# Patient Record
Sex: Female | Born: 1935
Health system: Southern US, Community
[De-identification: ages and names within clinical notes are randomized; demographics above are authoritative.]

## PROBLEM LIST (undated history)

## (undated) DIAGNOSIS — M1990 Unspecified osteoarthritis, unspecified site: Secondary | ICD-10-CM

## (undated) DIAGNOSIS — R351 Nocturia: Secondary | ICD-10-CM

## (undated) DIAGNOSIS — I10 Essential (primary) hypertension: Secondary | ICD-10-CM

## (undated) DIAGNOSIS — R3129 Other microscopic hematuria: Secondary | ICD-10-CM

## (undated) DIAGNOSIS — K5900 Constipation, unspecified: Secondary | ICD-10-CM

## (undated) DIAGNOSIS — N952 Postmenopausal atrophic vaginitis: Secondary | ICD-10-CM

## (undated) HISTORY — PX: REPLACEMENT TOTAL KNEE: SUR1224

## (undated) HISTORY — DX: Constipation, unspecified: K59.00

## (undated) HISTORY — PX: JOINT REPLACEMENT: SHX530

## (undated) HISTORY — DX: Essential (primary) hypertension: I10

## (undated) HISTORY — DX: Other microscopic hematuria: R31.29

## (undated) HISTORY — DX: Postmenopausal atrophic vaginitis: N95.2

## (undated) HISTORY — DX: Nocturia: R35.1

## (undated) HISTORY — PX: TONSILLECTOMY: SUR1361

## (undated) HISTORY — PX: ABDOMINAL HYSTERECTOMY: SHX81

## (undated) HISTORY — DX: Unspecified osteoarthritis, unspecified site: M19.90

---

## 1997-10-30 ENCOUNTER — Other Ambulatory Visit: Admission: RE | Admit: 1997-10-30 | Discharge: 1997-10-30 | Payer: Self-pay | Admitting: *Deleted

## 1997-12-12 ENCOUNTER — Other Ambulatory Visit: Admission: RE | Admit: 1997-12-12 | Discharge: 1997-12-12 | Payer: Self-pay | Admitting: *Deleted

## 1998-03-30 ENCOUNTER — Encounter: Payer: Self-pay | Admitting: Emergency Medicine

## 1998-03-30 ENCOUNTER — Emergency Department (HOSPITAL_COMMUNITY): Admission: EM | Admit: 1998-03-30 | Discharge: 1998-03-30 | Payer: Self-pay | Admitting: Emergency Medicine

## 2002-05-22 ENCOUNTER — Encounter: Payer: Self-pay | Admitting: Orthopedic Surgery

## 2002-05-30 ENCOUNTER — Inpatient Hospital Stay (HOSPITAL_COMMUNITY): Admission: RE | Admit: 2002-05-30 | Discharge: 2002-06-05 | Payer: Self-pay | Admitting: Orthopedic Surgery

## 2002-06-05 ENCOUNTER — Inpatient Hospital Stay (HOSPITAL_COMMUNITY)
Admission: RE | Admit: 2002-06-05 | Discharge: 2002-06-14 | Payer: Self-pay | Admitting: Physical Medicine & Rehabilitation

## 2003-03-04 ENCOUNTER — Inpatient Hospital Stay (HOSPITAL_COMMUNITY): Admission: RE | Admit: 2003-03-04 | Discharge: 2003-03-07 | Payer: Self-pay | Admitting: Orthopedic Surgery

## 2003-03-07 ENCOUNTER — Inpatient Hospital Stay (HOSPITAL_COMMUNITY)
Admission: RE | Admit: 2003-03-07 | Discharge: 2003-03-15 | Payer: Self-pay | Admitting: Physical Medicine & Rehabilitation

## 2003-03-13 ENCOUNTER — Encounter: Payer: Self-pay | Admitting: Physical Medicine & Rehabilitation

## 2004-03-13 ENCOUNTER — Ambulatory Visit (HOSPITAL_BASED_OUTPATIENT_CLINIC_OR_DEPARTMENT_OTHER): Admission: RE | Admit: 2004-03-13 | Discharge: 2004-03-13 | Payer: Self-pay | Admitting: General Surgery

## 2004-03-13 ENCOUNTER — Ambulatory Visit (HOSPITAL_COMMUNITY): Admission: RE | Admit: 2004-03-13 | Discharge: 2004-03-13 | Payer: Self-pay | Admitting: General Surgery

## 2009-06-17 DIAGNOSIS — F419 Anxiety disorder, unspecified: Secondary | ICD-10-CM | POA: Insufficient documentation

## 2009-06-17 DIAGNOSIS — M1990 Unspecified osteoarthritis, unspecified site: Secondary | ICD-10-CM | POA: Insufficient documentation

## 2009-06-17 DIAGNOSIS — Z79891 Long term (current) use of opiate analgesic: Secondary | ICD-10-CM | POA: Insufficient documentation

## 2009-06-17 DIAGNOSIS — K5900 Constipation, unspecified: Secondary | ICD-10-CM | POA: Insufficient documentation

## 2010-10-28 DIAGNOSIS — I10 Essential (primary) hypertension: Secondary | ICD-10-CM | POA: Insufficient documentation

## 2011-01-13 ENCOUNTER — Ambulatory Visit (HOSPITAL_COMMUNITY): Payer: Medicare Other | Attending: Surgery

## 2011-01-13 DIAGNOSIS — M810 Age-related osteoporosis without current pathological fracture: Secondary | ICD-10-CM | POA: Insufficient documentation

## 2011-03-03 DIAGNOSIS — E669 Obesity, unspecified: Secondary | ICD-10-CM | POA: Insufficient documentation

## 2011-12-10 ENCOUNTER — Encounter (INDEPENDENT_AMBULATORY_CARE_PROVIDER_SITE_OTHER): Payer: Self-pay | Admitting: General Surgery

## 2011-12-23 ENCOUNTER — Encounter (INDEPENDENT_AMBULATORY_CARE_PROVIDER_SITE_OTHER): Payer: Self-pay | Admitting: General Surgery

## 2011-12-23 DIAGNOSIS — R3129 Other microscopic hematuria: Secondary | ICD-10-CM | POA: Insufficient documentation

## 2011-12-27 ENCOUNTER — Ambulatory Visit (INDEPENDENT_AMBULATORY_CARE_PROVIDER_SITE_OTHER): Payer: BC Managed Care – PPO | Admitting: General Surgery

## 2011-12-27 ENCOUNTER — Encounter (INDEPENDENT_AMBULATORY_CARE_PROVIDER_SITE_OTHER): Payer: Self-pay | Admitting: General Surgery

## 2011-12-27 ENCOUNTER — Ambulatory Visit (INDEPENDENT_AMBULATORY_CARE_PROVIDER_SITE_OTHER): Payer: Medicare Other | Admitting: General Surgery

## 2011-12-27 VITALS — BP 155/90 | HR 64 | Temp 98.1°F | Resp 20 | Ht 63.0 in | Wt 200.8 lb

## 2011-12-27 DIAGNOSIS — K8020 Calculus of gallbladder without cholecystitis without obstruction: Secondary | ICD-10-CM

## 2011-12-27 NOTE — Patient Instructions (Signed)
During your workup for microscopic hematuria, a CT scan was done and demonstrated calcified gallstones in your gallbladder. This is not related to your kidney and bladder problems.  As best as I can tell, you have not had any symptoms of gallbladder attacks whatsoever.  At this point in time, I do not think that it is mandatory that you have a gallbladder operation.  On the other hand, if you startedeveloping abdominal pain, nausea or vomiting or developed abnormal liver function tests, then we should reconsider an operation to remove your gallbladder.  Please read the book that Dr. Derrell Lolling gave you.  Return to see Dr. Derrell Lolling if yo developed any gallbladder problems.

## 2011-12-27 NOTE — Progress Notes (Signed)
Patient ID: Susan Pitts, female   DOB: 1924/04/01, 78 y.o.   MRN: [id number]  Chief Complaint  Patient presents with  . Other    eval GB with stones    HPI Susan Pitts is a 78 y.o. female.  She is referred by Susan Pitts  urology for evaluation of calcified gallstones seen on CT scan. Her primary care physician is Susan Pitts.  This patient is 78 years  old, has moderately severe arthritis. has had bilateral total knee replacements and has moderate to severe arthritis in her hands. She also has a history of well-controlled hypertension, obesity and partial hysterectomy.  She has not had any gastrointestinal complaints. Her appetite is normal. She does not have any type of fatty food intolerance. He doesn't have any abdominal pain, nausea, vomiting or alteration in her bowel habits. She has no history of hepatitis or abnormal liver function tests. She has no history of pancreatitis.  She was recently evaluated for asymptomatic microscopic hematuria. She's had CT scan and cystoscopy and nothing has been found, apparently. The CT scan showed calcified gallstones within the gallbladder, the largest measuring 1.5 cm. She was referred to me for a surgical opinion about her gallbladder.  She is asymptomatic today and her daughter is with her. HPI  Past Medical History  Diagnosis Date  . Microscopic hematuria   . Postmenopausal atrophic vaginitis   . Hypertension   . Arthritis   . Constipation   . Nocturia     Past Surgical History  Procedure Date  . Abdominal hysterectomy   . Joint replacement     knee replacement  . Tonsillectomy     Family History  Problem Relation Age of Onset  . Heart attack Father     Social History History  Substance Use Topics  . Smoking status: Never Smoker   . Smokeless tobacco: Never Used  . Alcohol Use: No    Allergies  Allergen Reactions  . Cardura (Doxazosin Mesylate)   . Codeine   . Inderal (Propranolol)   . Penicillins      Current Outpatient Prescriptions  Medication Sig Dispense Refill  . aspirin 81 MG tablet Take 81 mg by mouth daily.      . benazepril (LOTENSIN) 40 MG tablet Take 40 mg by mouth daily.      . Calcium Carbonate-Vitamin D (CALTRATE 600+D PO) Take by mouth.      . Cholecalciferol (VITAMIN D-3 PO) Take by mouth.      . clindamycin (CLEOCIN) 300 MG capsule Take 300 mg by mouth 3 (three) times daily.      . diazepam (VALIUM) 5 MG tablet Take 5 mg by mouth every 6 (six) hours as needed.      . furosemide (LASIX) 40 MG tablet Take 40 mg by mouth daily.      Marland Kitchen HYDROcodone-acetaminophen (VICODIN) 5-500 MG per tablet Take 1 tablet by mouth every 6 (six) hours as needed.      . Loratadine (ALAVERT PO) Take by mouth.      Marland Kitchen LORATADINE PO Take by mouth.      . METOPROLOL TARTRATE PO Take 25 mg by mouth.        Review of Systems Review of Systems  Constitutional: Negative for fever, chills and unexpected weight change.  HENT: Negative for hearing loss, congestion, sore throat, trouble swallowing and voice change.   Eyes: Negative for visual disturbance.  Respiratory: Negative for cough and wheezing.   Cardiovascular: Negative for  chest pain, palpitations and leg swelling.  Gastrointestinal: Negative for nausea, vomiting, abdominal pain, diarrhea, constipation, blood in stool, abdominal distention and anal bleeding.  Genitourinary: Negative for hematuria, vaginal bleeding and difficulty urinating.  Musculoskeletal: Positive for myalgias, joint swelling, arthralgias and gait problem.  Skin: Negative for rash and wound.  Neurological: Negative for seizures, syncope and headaches.  Hematological: Negative for adenopathy. Does not bruise/bleed easily.  Psychiatric/Behavioral: Negative for confusion. The patient is nervous/anxious.     Blood pressure 155/90, pulse 64, temperature 98.1 F (36.7 C), temperature source Temporal, resp. rate 20, height 5\' 3"  (1.6 m), weight 200 lb 12.8 oz (91.082  kg).  Physical Exam Physical Exam  Constitutional: She is oriented to person, place, and time. She appears well-developed and well-nourished. No distress.  HENT:  Head: Normocephalic and atraumatic.  Nose: Nose normal.  Mouth/Throat: No oropharyngeal exudate.  Eyes: Conjunctivae and EOM are normal. Pupils are equal, round, and reactive to light. Left eye exhibits no discharge. No scleral icterus.  Neck: Neck supple. No JVD present. No tracheal deviation present. No thyromegaly present.  Cardiovascular: Normal rate, regular rhythm, normal heart sounds and intact distal pulses.   No murmur heard. Pulmonary/Chest: Effort normal and breath sounds normal. No respiratory distress. She has no wheezes. She has no rales. She exhibits no tenderness.  Abdominal: Soft. Bowel sounds are normal. She exhibits no distension and no mass. There is no tenderness. There is no rebound and no guarding.       Obese.  Musculoskeletal: She exhibits no edema and no tenderness.       Changes of arthritis in the hands.arthritic gait.  Lymphadenopathy:    She has no cervical adenopathy.  Neurological: She is alert and oriented to person, place, and time. She exhibits normal muscle tone. Coordination normal.  Skin: Skin is warm. No rash noted. She is not diaphoretic. No erythema. No pallor.  Psychiatric: She has a normal mood and affect. Her behavior is normal. Judgment and thought content normal.    Data Reviewed I reviewed her recent CT scan and the notes from [facility]  Assessment    Asymptomatic gallstones. Considering her advanced age and absence of diabetes, I do not think that there is a compelling reason to perform a prophylactic cholecystectomy. If she develops any biliary tract symptoms, I think that we should reconsider and proceed with cholecystectomy at that time.  Microscopic hematuria, no specific etiology identified following extensive evaluation  Arthritis  Hypertension  Obesity.     Plan    We had a lengthy discussion regarding these issues with the patient and her daughter. She was given patient information booklets explaining  symptoms, pathophysiology, diagnosis, and surgical procedures. They understand that if she develops any symptoms of gallbladder disease, that we should consider cholecystectomy at that time. They're also aware that she may never develop any symptoms, and might  avoid surgery at her advanced age.  They seemed to understand these issues well.  They will call me or Dr. Eloise Harman if biliary tract symptoms appear.       Angelia Mould. Derrell Lolling, M.D., [facility] General and Minimally invasive Surgery Breast and Colorectal Surgery Office:   [phone] Pager:   [phone]  12/27/2011, 2:50 PM

## 2012-01-10 ENCOUNTER — Encounter (INDEPENDENT_AMBULATORY_CARE_PROVIDER_SITE_OTHER): Payer: Self-pay

## 2013-02-06 ENCOUNTER — Other Ambulatory Visit: Payer: Self-pay | Admitting: Ophthalmology

## 2013-10-22 ENCOUNTER — Emergency Department (HOSPITAL_COMMUNITY): Payer: Medicare Other

## 2013-10-22 ENCOUNTER — Encounter (HOSPITAL_COMMUNITY): Payer: Self-pay | Admitting: Emergency Medicine

## 2013-10-22 ENCOUNTER — Emergency Department (HOSPITAL_COMMUNITY)
Admission: EM | Admit: 2013-10-22 | Discharge: 2013-10-22 | Disposition: A | Discharge status: Home / Self Care | Payer: Medicare Other | Attending: Emergency Medicine | Admitting: Emergency Medicine

## 2013-10-22 DIAGNOSIS — M129 Arthropathy, unspecified: Secondary | ICD-10-CM | POA: Insufficient documentation

## 2013-10-22 DIAGNOSIS — S0510XA Contusion of eyeball and orbital tissues, unspecified eye, initial encounter: Secondary | ICD-10-CM | POA: Insufficient documentation

## 2013-10-22 DIAGNOSIS — S0990XA Unspecified injury of head, initial encounter: Secondary | ICD-10-CM | POA: Insufficient documentation

## 2013-10-22 DIAGNOSIS — Z7982 Long term (current) use of aspirin: Secondary | ICD-10-CM | POA: Insufficient documentation

## 2013-10-22 DIAGNOSIS — W19XXXA Unspecified fall, initial encounter: Secondary | ICD-10-CM

## 2013-10-22 DIAGNOSIS — Z79899 Other long term (current) drug therapy: Secondary | ICD-10-CM | POA: Insufficient documentation

## 2013-10-22 DIAGNOSIS — Z8742 Personal history of other diseases of the female genital tract: Secondary | ICD-10-CM | POA: Insufficient documentation

## 2013-10-22 DIAGNOSIS — H05239 Hemorrhage of unspecified orbit: Secondary | ICD-10-CM

## 2013-10-22 DIAGNOSIS — K5900 Constipation, unspecified: Secondary | ICD-10-CM | POA: Insufficient documentation

## 2013-10-22 DIAGNOSIS — W010XXA Fall on same level from slipping, tripping and stumbling without subsequent striking against object, initial encounter: Secondary | ICD-10-CM | POA: Insufficient documentation

## 2013-10-22 DIAGNOSIS — I10 Essential (primary) hypertension: Secondary | ICD-10-CM | POA: Insufficient documentation

## 2013-10-22 DIAGNOSIS — Z792 Long term (current) use of antibiotics: Secondary | ICD-10-CM | POA: Insufficient documentation

## 2013-10-22 DIAGNOSIS — Y9229 Other specified public building as the place of occurrence of the external cause: Secondary | ICD-10-CM | POA: Insufficient documentation

## 2013-10-22 DIAGNOSIS — W1809XA Striking against other object with subsequent fall, initial encounter: Secondary | ICD-10-CM | POA: Insufficient documentation

## 2013-10-22 DIAGNOSIS — Y9389 Activity, other specified: Secondary | ICD-10-CM | POA: Insufficient documentation

## 2013-10-22 DIAGNOSIS — Z87448 Personal history of other diseases of urinary system: Secondary | ICD-10-CM | POA: Insufficient documentation

## 2013-10-22 DIAGNOSIS — Z880 Allergy status to penicillin: Secondary | ICD-10-CM | POA: Insufficient documentation

## 2013-10-22 DIAGNOSIS — S00511A Abrasion of lip, initial encounter: Secondary | ICD-10-CM

## 2013-10-22 DIAGNOSIS — Z23 Encounter for immunization: Secondary | ICD-10-CM | POA: Insufficient documentation

## 2013-10-22 DIAGNOSIS — IMO0002 Reserved for concepts with insufficient information to code with codable children: Secondary | ICD-10-CM | POA: Insufficient documentation

## 2013-10-22 MED ORDER — TETANUS-DIPHTH-ACELL PERTUSSIS 5-2.5-18.5 LF-MCG/0.5 IM SUSP
0.5000 mL | Freq: Once | INTRAMUSCULAR | Status: AC
  Administered 2013-10-22: 0.5 mL via INTRAMUSCULAR
  Filled 2013-10-22: qty 0.5

## 2013-10-22 MED ORDER — OXYCODONE-ACETAMINOPHEN 5-325 MG PO TABS: 1.0000 | ORAL_TABLET | Freq: Four times a day (QID) | ORAL | Status: DC | PRN

## 2013-10-22 MED ORDER — OXYCODONE-ACETAMINOPHEN 5-325 MG PO TABS
1.0000 | ORAL_TABLET | Freq: Once | ORAL | Status: AC
  Administered 2013-10-22: 1 via ORAL
  Filled 2013-10-22: qty 1

## 2013-10-22 NOTE — Discharge Instructions (Signed)
Return to the ED with any concerns including vomiting, seizure activity, changes in vision, decreased level of alertness/lethargy, or any other alarming symptoms  You should not take hydrocodone at the same time you are taking oxycodone as they both contain added tylenol.  You should not take more than 4 grams of tylenol in 24 hours.

## 2013-10-22 NOTE — ED Provider Notes (Signed)
CSN: [id number]     Arrival date & time 10/22/13  1316 History   First MD Initiated Contact with Patient 10/22/13 1324     Chief Complaint  Patient presents with  . Fall     (Consider location/radiation/quality/duration/timing/severity/associated sxs/prior Treatment) HPI Pt presenting with c/o fall.  She states she tripped while cleaning out the garage.  She hit the right side of her face.  No LOC, no vomiting, no seizure activity.  Denies neck or back pain.   No changes in vision.  No eye pain. No epistaxis.  No difficulty breathing or abdominal pain.  Pt not sure of when her last tetanus shot was.  There are no other associated systemic symptoms, there are no other alleviating or modifying factors.   Past Medical History  Diagnosis Date  . Microscopic hematuria   . Postmenopausal atrophic vaginitis   . Hypertension   . Arthritis   . Constipation   . Nocturia    Past Surgical History  Procedure Laterality Date  . Abdominal hysterectomy    . Joint replacement      knee replacement  . Tonsillectomy     Family History  Problem Relation Age of Onset  . Heart attack Father    History  Substance Use Topics  . Smoking status: Never Smoker   . Smokeless tobacco: Never Used  . Alcohol Use: No   OB History   Grav Para Term Preterm Abortions TAB SAB Ect Mult Living                 Review of Systems ROS reviewed and all otherwise negative except for mentioned in HPI    Allergies  Cardura; Codeine; Inderal; and Penicillins  Home Medications   Prior to Admission medications   Medication Sig Start Date End Date Taking? Authorizing Provider  aspirin 81 MG tablet Take 81 mg by mouth daily.   Yes Historical Provider, MD  benazepril (LOTENSIN) 40 MG tablet Take 40 mg by mouth daily.   Yes Historical Provider, MD  Calcium Carbonate-Vitamin D (CALTRATE 600+D PO) Take 1 tablet by mouth daily.    Yes Historical Provider, MD  Cholecalciferol (VITAMIN D-3 PO) Take 2,000 Units by  mouth daily.    Yes Historical Provider, MD  diazepam (VALIUM) 5 MG tablet Take 5 mg by mouth 2 (two) times daily.    Yes Historical Provider, MD  Docusate Calcium (STOOL SOFTENER PO) Take 100 mg by mouth daily.    Yes Historical Provider, MD  furosemide (LASIX) 40 MG tablet Take 40 mg by mouth daily.   Yes Historical Provider, MD  HYDROcodone-acetaminophen (VICODIN) 5-500 MG per tablet Take 1 tablet by mouth every 6 (six) hours as needed for pain.    Yes Historical Provider, MD  loratadine (CLARITIN) 10 MG tablet Take 10 mg by mouth daily as needed for allergies.   Yes Historical Provider, MD  metoprolol tartrate (LOPRESSOR) 25 MG tablet Take 25 mg by mouth daily.   Yes Historical Provider, MD  OVER THE COUNTER MEDICATION Take 1 drop by mouth daily. Takes one dropper full every day.   Yes Historical Provider, MD  clindamycin (CLEOCIN) 300 MG capsule Take 600 mg by mouth See admin instructions. 600 mg one hour before dental work and 600 mg 6 hours after.    Historical Provider, MD   BP 175/96  Pulse 76  Temp(Src) 98 F (36.7 C) (Oral)  Resp 16  SpO2 96% Vitals reviewed Physical Exam Physical Examination: General appearance - alert,  well appearing, and in no distress Mental status - alert, oriented to person, place, and time Eyes - pupils equal and reactive, extraocular eye movements intact without pain Face- swelling and echhymosis around left eye with some mild ttp, no maloclusion, no midface tenderness or instability Mouth - mucous membranes moist, pharynx normal without lesions, abrasion of upper and lower lip- no laceration, no involvement of vermillion border Neck - supple, no significant adenopathy, no midline tenderness to palpation Chest - clear to auscultation, no wheezes, rales or rhonchi, symmetric air entry Heart - normal rate, regular rhythm, normal S1, S2, no murmurs, rubs, clicks or gallops Abdomen - soft, nontender, nondistended, no masses or organomegaly Back exam - full  range of motion, no midline tenderness, palpable spasm or pain on motion, no CVA tenderness Extremities - peripheral pulses normal, no pedal edema, no clubbing or cyanosis Skin - normal coloration and turgor, no rashes  ED Course  Procedures (including critical care time) Labs Review Labs Reviewed - No data to display  Imaging Review No results found.   EKG Interpretation None      MDM   Final diagnoses:  Fall  Periorbital hematoma  Abrasion of lip    Pt presenting with c/o fall with bruising over right face.  Head and MF CT are reassuring.  Tetanus updated.  Discharged with strict return precautions.  Pt agreeable with plan.   Ethelda Chick, MD 10/25/13 [phone]

## 2013-10-22 NOTE — ED Notes (Signed)
Pt here from home after tripping and falling in the garage hitting the right side of her face , no loc

## 2013-10-24 DIAGNOSIS — W19XXXA Unspecified fall, initial encounter: Secondary | ICD-10-CM | POA: Insufficient documentation

## 2013-12-12 DIAGNOSIS — F329 Major depressive disorder, single episode, unspecified: Secondary | ICD-10-CM | POA: Insufficient documentation

## 2013-12-12 DIAGNOSIS — R269 Unspecified abnormalities of gait and mobility: Secondary | ICD-10-CM | POA: Insufficient documentation

## 2015-01-09 ENCOUNTER — Ambulatory Visit (INDEPENDENT_AMBULATORY_CARE_PROVIDER_SITE_OTHER): Payer: Medicare Other | Admitting: Podiatry

## 2015-01-09 ENCOUNTER — Encounter: Payer: Self-pay | Admitting: Podiatry

## 2015-01-09 VITALS — BP 149/68 | HR 67 | Resp 12

## 2015-01-09 DIAGNOSIS — M2041 Other hammer toe(s) (acquired), right foot: Secondary | ICD-10-CM | POA: Diagnosis not present

## 2015-01-09 DIAGNOSIS — Q828 Other specified congenital malformations of skin: Secondary | ICD-10-CM | POA: Diagnosis not present

## 2015-01-09 NOTE — Patient Instructions (Signed)
Distal clavus 3rd toe right

## 2015-01-09 NOTE — Progress Notes (Signed)
   Subjective:    Patient ID: Susan Pitts, female    DOB: 1924/04/07, 81 y.o.   MRN: [id number]  HPI: She presents today complaining of a painful wart to the plantar distal aspect of the third digit of the right foot. She states that her primary care doctor Jarold Motto has tried to trim on it on several occasions and has since referred her to our practice due to his inability to resolve her pain. She denies any trauma to the toes and states that it hurts anytime she is ambulatory.    Review of Systems  Respiratory: Positive for shortness of breath.   Genitourinary: Positive for frequency.  Musculoskeletal: Positive for back pain and gait problem.  Neurological: Positive for weakness.  Hematological: Bruises/bleeds easily.       Objective:   Physical Exam: 81 year old white female vital signs are stable she is alert and oriented 3. Pulses are palpable bilateral. Neurologic sensorium is intact for Semmes-Weinstein monofilament. Deep tendon reflexes are intact bilateral and muscle strength +5 over 5 dorsiflexion plantar flexors and inverters everters all intrinsic musculature is intact. Orthopedic evaluation demonstrate solid joints distal to the ankle have full range of motion without crepitation. Cutaneous evaluation demonstrates supple well-hydrated cutis distal lesion third digit of the right foot. Orthopedic also doesn't straight hammertoe deformities bilateral these are rigid in nature particularly to the right foot which is resulting in a distal clavus of the third toe. This is not verrucoid in nature it is simply a distal clavus with porokeratotic tendencies. No signs of infection.        Assessment & Plan:  Assessment: Hammertoe deformities with osteoarthritic changes right foot. Distal clavus third digit of the right foot.  Plan: Discussed etiology pathology conservative versus surgical therapies. Debrided the reactive hyperkeratotic lesion 1 distal aspect third digit right and  placed her in a sulcus pad. Follow up with her as needed.

## 2016-05-27 ENCOUNTER — Ambulatory Visit (HOSPITAL_COMMUNITY)
Admission: EM | Admit: 2016-05-27 | Discharge: 2016-05-27 | Disposition: A | Discharge status: Home / Self Care | Payer: Medicare Other | Attending: Family Medicine | Admitting: Family Medicine

## 2016-05-27 ENCOUNTER — Ambulatory Visit (INDEPENDENT_AMBULATORY_CARE_PROVIDER_SITE_OTHER): Payer: Medicare Other

## 2016-05-27 ENCOUNTER — Encounter (HOSPITAL_COMMUNITY): Payer: Self-pay | Admitting: Family Medicine

## 2016-05-27 DIAGNOSIS — M25531 Pain in right wrist: Secondary | ICD-10-CM | POA: Diagnosis not present

## 2016-05-27 DIAGNOSIS — M19231 Secondary osteoarthritis, right wrist: Secondary | ICD-10-CM | POA: Diagnosis not present

## 2016-05-27 DIAGNOSIS — S63501A Unspecified sprain of right wrist, initial encounter: Secondary | ICD-10-CM | POA: Diagnosis not present

## 2016-05-27 NOTE — ED Triage Notes (Signed)
Pt here for right wrist pain and swelling. Denise injury. sts x 3 days.

## 2016-05-27 NOTE — ED Provider Notes (Signed)
CSN: [id number]     Arrival date & time 05/27/16  1406 History   None    Chief Complaint  Patient presents with  . Wrist Pain   (Consider location/radiation/quality/duration/timing/severity/associated sxs/prior Treatment) Patient c/o right wrist pain and discomfort.   The history is provided by the patient and a relative.  Wrist Pain  This is a new problem. The current episode started more than 2 days ago. The problem occurs constantly. The problem has not changed since onset.Nothing aggravates the symptoms.    Past Medical History:  Diagnosis Date  . Arthritis   . Constipation   . Hypertension   . Microscopic hematuria   . Nocturia   . Postmenopausal atrophic vaginitis    Past Surgical History:  Procedure Laterality Date  . ABDOMINAL HYSTERECTOMY    . JOINT REPLACEMENT     knee replacement  . TONSILLECTOMY     Family History  Problem Relation Age of Onset  . Heart attack Father    Social History  Substance Use Topics  . Smoking status: Never Smoker  . Smokeless tobacco: Never Used  . Alcohol use No   OB History    No data available     Review of Systems  Constitutional: Negative.   HENT: Negative.   Eyes: Negative.   Respiratory: Negative.   Cardiovascular: Negative.   Endocrine: Negative.   Genitourinary: Negative.   Musculoskeletal: Positive for joint swelling.  Skin: Negative.   Allergic/Immunologic: Negative.   Neurological: Negative.   Hematological: Negative.   Psychiatric/Behavioral: Negative.     Allergies  Cardura [doxazosin mesylate]; Codeine; Inderal [propranolol]; and Penicillins  Home Medications   Prior to Admission medications   Medication Sig Start Date End Date Taking? Authorizing Provider  aspirin 81 MG tablet Take 81 mg by mouth daily.    Historical Provider, MD  benazepril (LOTENSIN) 40 MG tablet Take 40 mg by mouth daily.    Historical Provider, MD  Calcium Carbonate-Vitamin D (CALTRATE 600+D PO) Take 1 tablet by mouth  daily.     Historical Provider, MD  Cholecalciferol (VITAMIN D-3 PO) Take 2,000 Units by mouth daily.     Historical Provider, MD  clindamycin (CLEOCIN) 300 MG capsule Take 600 mg by mouth See admin instructions. 600 mg one hour before dental work and 600 mg 6 hours after.    Historical Provider, MD  diazepam (VALIUM) 5 MG tablet Take 5 mg by mouth 2 (two) times daily.     Historical Provider, MD  Docusate Calcium (STOOL SOFTENER PO) Take 100 mg by mouth daily.     Historical Provider, MD  furosemide (LASIX) 40 MG tablet Take 40 mg by mouth daily.    Historical Provider, MD  HYDROcodone-acetaminophen (VICODIN) 5-500 MG per tablet Take 1 tablet by mouth every 6 (six) hours as needed for pain.     Historical Provider, MD  loratadine (CLARITIN) 10 MG tablet Take 10 mg by mouth daily as needed for allergies.    Historical Provider, MD  metoprolol tartrate (LOPRESSOR) 25 MG tablet Take 25 mg by mouth daily.    Historical Provider, MD  OVER THE COUNTER MEDICATION Take 1 drop by mouth daily. Takes one dropper full every day.    Historical Provider, MD  oxyCODONE-acetaminophen (PERCOCET/ROXICET) 5-325 MG per tablet Take 1-2 tablets by mouth every 6 (six) hours as needed for severe pain. 10/22/13   Jerelyn ScottMartha Linker, MD   Meds Ordered and Administered this Visit  Medications - No data to display  BP Marland Kitchen(!)  181/45   Pulse 96   Temp 97.8 F (36.6 C)   Resp 16   SpO2 96%  No data found.   Physical Exam  Constitutional: She appears well-developed and well-nourished.  HENT:  Head: Normocephalic and atraumatic.  Eyes: EOM are normal. Pupils are equal, round, and reactive to light.  Neck: Normal range of motion. Neck supple.  Cardiovascular: Normal rate, regular rhythm and normal heart sounds.   Pulmonary/Chest: Effort normal and breath sounds normal.  Musculoskeletal: She exhibits tenderness.  Right wrist tenderness and mild swelling  Nursing note and vitals reviewed.   Urgent Care Course   Clinical  Course     Procedures (including critical care time)  Labs Review Labs Reviewed - No data to display  Imaging Review Dg Wrist Complete Right  Result Date: 05/27/2016 CLINICAL DATA:  Right wrist pain, no acute injury EXAM: RIGHT WRIST - COMPLETE 3+ VIEW COMPARISON:  None. FINDINGS: There is degenerative change involving the right radiocarpal joint space with some loss of joint space and sclerosis with some fusion of the distal right radius and ulna. There is degenerative change along the radial aspect of the wrist at the articulation of both the first metatarsal and trapezium as well as the navicular and trapezium. No acute fracture is seen. Alignment is normal. IMPRESSION: Degenerative change in the right wrist and at the right [facility]. No acute abnormality. Electronically Signed   By: Dwyane DeePaul  Barry M.D.   On: 05/27/2016 15:24     Visual Acuity Review  Right Eye Distance:   Left Eye Distance:   Bilateral Distance:    Right Eye Near:   Left Eye Near:    Bilateral Near:         MDM   1. Sprain of right wrist, initial encounter   2. Other secondary osteoarthritis of right wrist   3. Right wrist pain    Take hydrocodone from home Right wrist splint Follow up with PCP.      Deatra CanterWilliam J Brodin Gelpi, FNP 05/27/16 1556

## 2016-05-27 NOTE — ED Notes (Signed)
Question on discharge

## 2016-09-23 ENCOUNTER — Other Ambulatory Visit: Payer: Self-pay | Admitting: Internal Medicine

## 2016-09-23 NOTE — Progress Notes (Signed)
Changed home med list per patient request

## 2017-01-13 DIAGNOSIS — M19049 Primary osteoarthritis, unspecified hand: Secondary | ICD-10-CM | POA: Insufficient documentation

## 2017-12-20 DIAGNOSIS — I739 Peripheral vascular disease, unspecified: Secondary | ICD-10-CM | POA: Insufficient documentation

## 2018-03-09 ENCOUNTER — Ambulatory Visit: Payer: Medicare Other | Admitting: Podiatry

## 2018-03-09 ENCOUNTER — Encounter: Payer: Self-pay | Admitting: Podiatry

## 2018-03-09 VITALS — BP 172/66 | HR 68 | Resp 16

## 2018-03-09 DIAGNOSIS — Q828 Other specified congenital malformations of skin: Secondary | ICD-10-CM

## 2018-03-09 DIAGNOSIS — B351 Tinea unguium: Secondary | ICD-10-CM

## 2018-03-09 DIAGNOSIS — M79676 Pain in unspecified toe(s): Secondary | ICD-10-CM

## 2018-03-09 NOTE — Progress Notes (Signed)
She presents today chief complaint of painful elongated toenails and according to the third digit distal aspect with hammertoe deformity.  Objective: Vital signs are stable she is alert and oriented x3 pulses are palpable.  Hammertoe deformities resulting in the distal clavus of the third digit of the right foot with no underlying lung or opening.  She also has thick yellow dystrophic-like mycotic toenails.  Assessment: Distal clavus third digit right foot.  Pain in limb center onychomycosis.  Plan: Debridement of toenails 1 through 5 bilateral.  Debridement of reactive hyperkeratosis.

## 2018-06-13 ENCOUNTER — Ambulatory Visit: Payer: Medicare Other | Admitting: Podiatry

## 2018-06-13 ENCOUNTER — Encounter: Payer: Self-pay | Admitting: Podiatry

## 2018-06-13 DIAGNOSIS — B351 Tinea unguium: Secondary | ICD-10-CM | POA: Diagnosis not present

## 2018-06-13 DIAGNOSIS — M79676 Pain in unspecified toe(s): Secondary | ICD-10-CM

## 2018-06-13 DIAGNOSIS — Q828 Other specified congenital malformations of skin: Secondary | ICD-10-CM | POA: Diagnosis not present

## 2018-06-13 NOTE — Progress Notes (Signed)
She presents today with a chief complaint of a painful callus to the distal aspect of the third toe on the right foot.  States that she has arthritis in the third toe hurts and did not really feel much better after we trimmed it last time.  She is also complaining of painful elongated toenails.  Objective: Vital signs are stable she is alert and oriented x3.  Pulses are palpable.  Toenails are long thick yellow dystrophic with mycotic reactive hyperkeratotic lesion without an open wound to the distal aspect of the third toe of the right foot.  Assessment: Pain in limb secondary to onychomycosis and porokeratosis third digit right foot.  Plan: Debridement of all reactive hyperkeratotic tissue and debridement of toenails 1 through 5 bilateral.

## 2018-07-11 IMAGING — DX DG WRIST COMPLETE 3+V*R*
4 series · 4 of 4 positions shown · non-contrast
Comparison: None.

CLINICAL DATA: Right wrist pain, no acute injury

EXAM:
RIGHT WRIST - COMPLETE 3+ VIEW

[wrist pa]
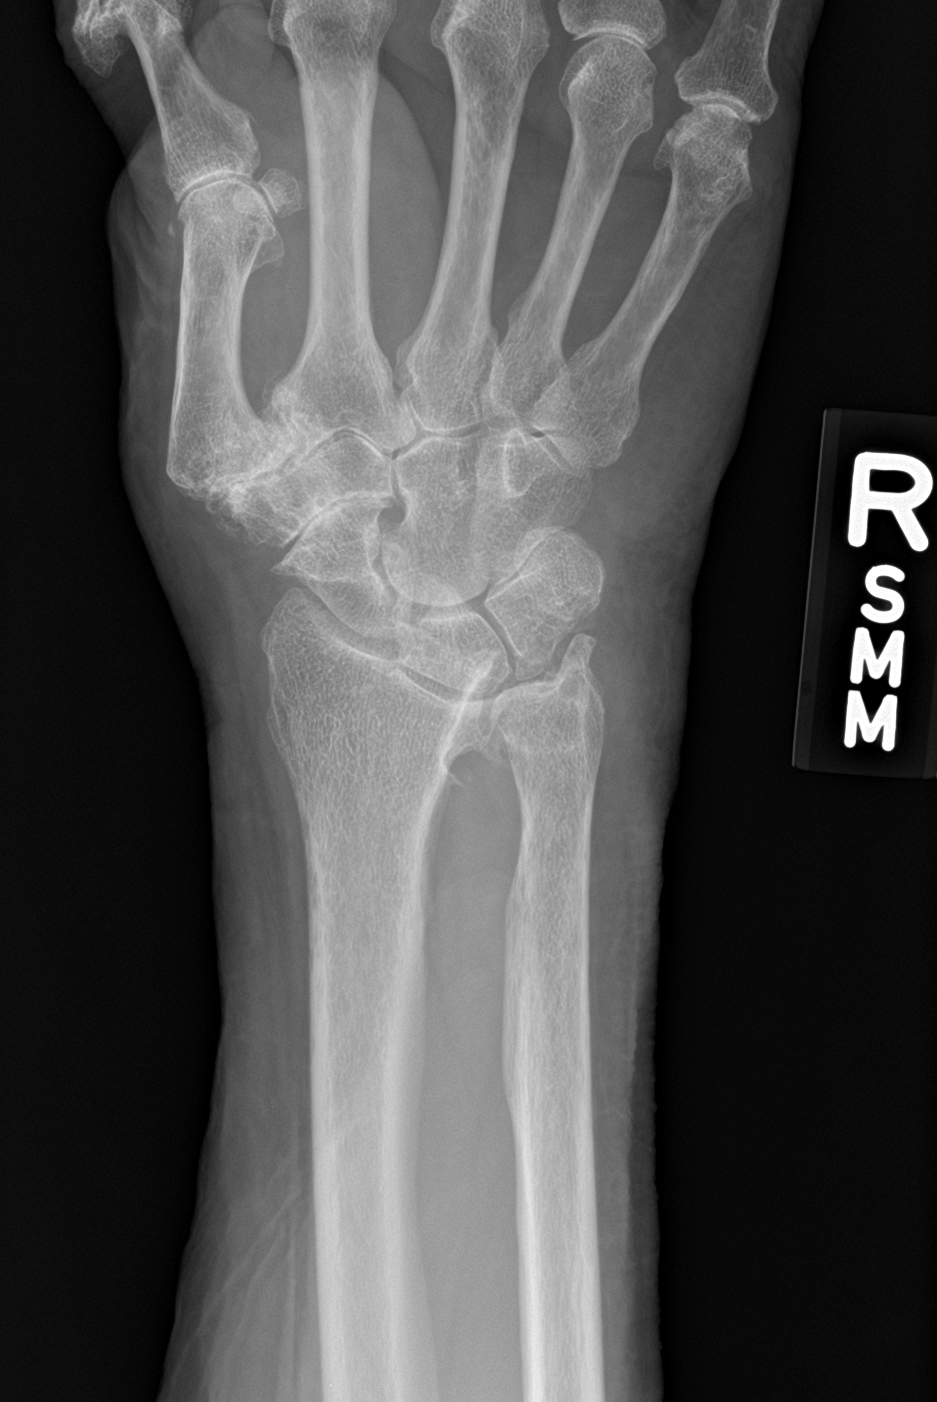

[wrist navicular]
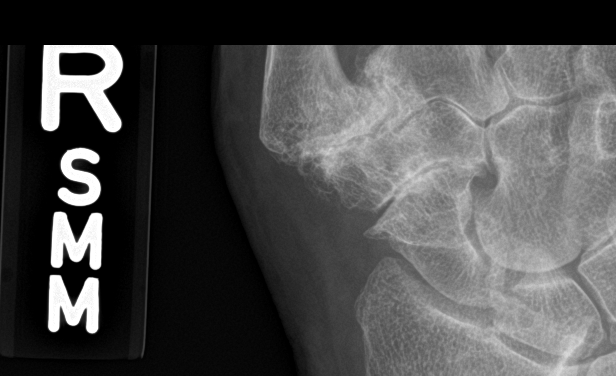

[wrist obl]
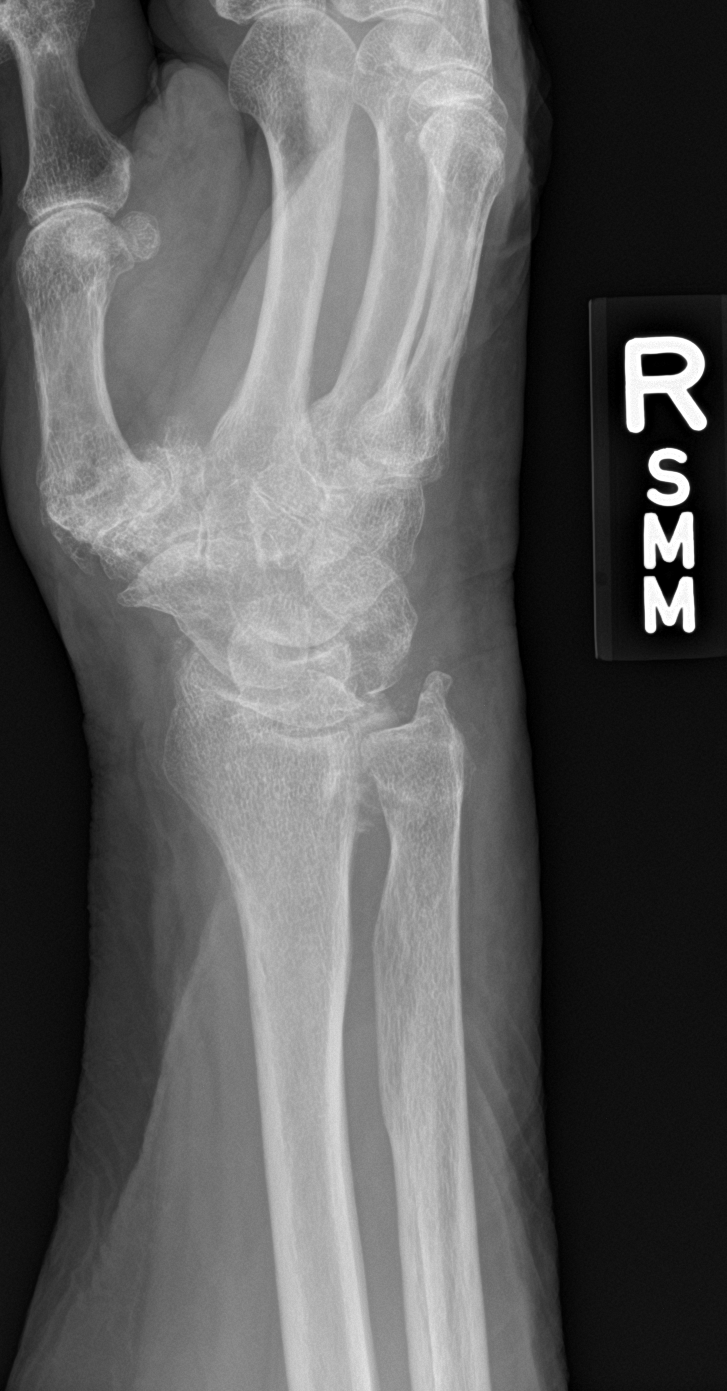

[wrist lat]
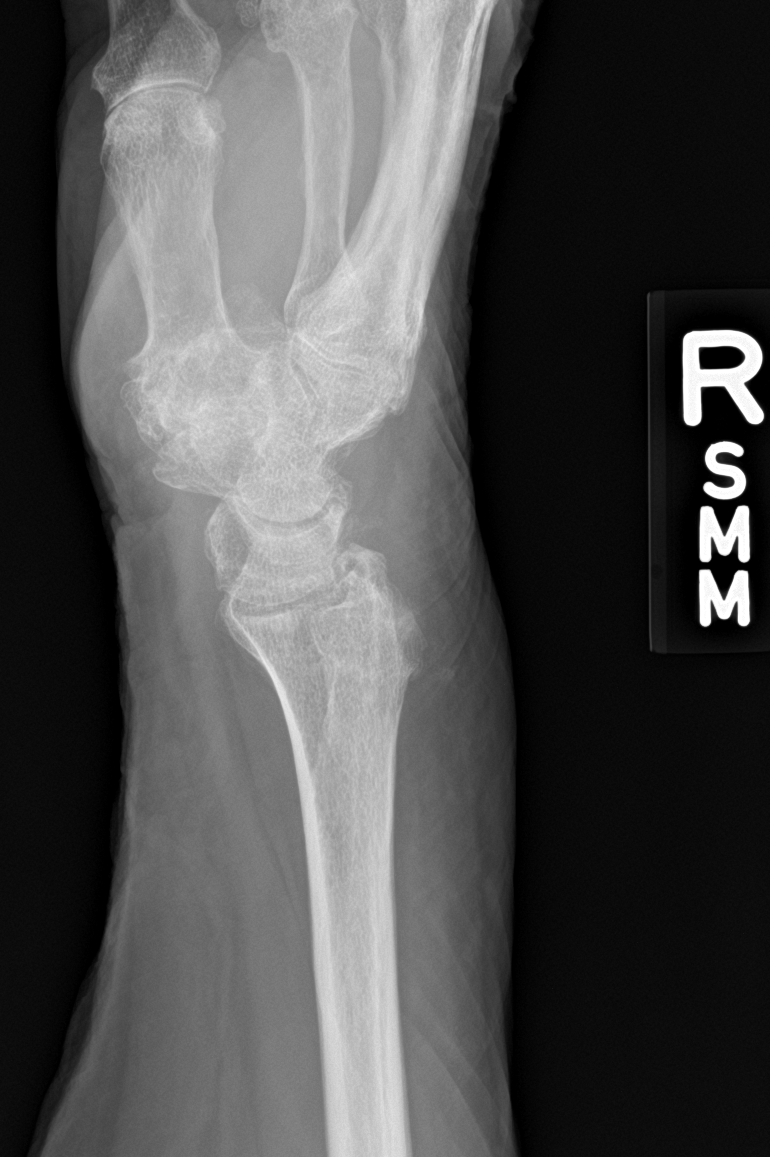

[4 of 4 positions shown; findings below may reference images not displayed]

FINDINGS: There is degenerative change involving the right radiocarpal joint
space with some loss of joint space and sclerosis with some fusion
of the distal right radius and ulna. There is degenerative change
along the radial aspect of the wrist at the articulation of both the
first metatarsal and trapezium as well as the navicular and
trapezium. No acute fracture is seen. Alignment is normal.
IMPRESSION: Degenerative change in the right wrist and at the right first CMC
joint. No acute abnormality.

## 2018-09-07 ENCOUNTER — Ambulatory Visit: Payer: Medicare Other | Admitting: Podiatry

## 2018-09-26 ENCOUNTER — Ambulatory Visit: Payer: Medicare Other | Admitting: Podiatry

## 2018-11-21 ENCOUNTER — Other Ambulatory Visit: Payer: Self-pay

## 2018-11-21 ENCOUNTER — Encounter: Payer: Self-pay | Admitting: Podiatry

## 2018-11-21 ENCOUNTER — Ambulatory Visit (INDEPENDENT_AMBULATORY_CARE_PROVIDER_SITE_OTHER): Payer: Medicare Other | Admitting: Podiatry

## 2018-11-21 DIAGNOSIS — M79675 Pain in left toe(s): Secondary | ICD-10-CM | POA: Diagnosis not present

## 2018-11-21 DIAGNOSIS — B351 Tinea unguium: Secondary | ICD-10-CM | POA: Diagnosis not present

## 2018-11-21 DIAGNOSIS — L84 Corns and callosities: Secondary | ICD-10-CM | POA: Diagnosis not present

## 2018-11-21 DIAGNOSIS — M79674 Pain in right toe(s): Secondary | ICD-10-CM

## 2018-11-21 NOTE — Progress Notes (Signed)
This patient presents the office with chief complaint of a painful callus on the tip of the third toe right foot.  This callus is painful walking and wearing her shoes.  She also says that she has long thick painful nails which she is unable to trim herself since she has arthritis.  She presents the office today for preventative foot care services.  General Appearance  Alert, conversant and in no acute stress.  Vascular  Dorsalis pedis and posterior tibial  pulses are palpable  bilaterally.  Capillary return is within normal limits  bilaterally. Temperature is within normal limits  bilaterally.  Neurologic  Senn-Weinstein monofilament wire test within normal limits  bilaterally. Muscle power within normal limits bilaterally.  Nails Thick disfigured discolored nails with subungual debris  from hallux to fifth toes bilaterally. No evidence of bacterial infection or drainage bilaterally.  Orthopedic  No limitations of motion  feet .  No crepitus or effusions noted.  No bony pathology or digital deformities noted.  Skin  normotropic skin with no porokeratosis noted bilaterally.  No signs of infections or ulcers noted.  Distal clavi 3rd toe right foot.  Onychomycosis  X 10.  Clavi 3rd toe right foot.  Debride nails  X 10.  Debride clavi 3rd toe right.    RTC   3 months.   Gardiner Barefoot DPM

## 2019-02-27 ENCOUNTER — Encounter: Payer: Self-pay | Admitting: Podiatry

## 2019-02-27 ENCOUNTER — Other Ambulatory Visit: Payer: Self-pay

## 2019-02-27 ENCOUNTER — Ambulatory Visit (INDEPENDENT_AMBULATORY_CARE_PROVIDER_SITE_OTHER): Payer: Medicare Other | Admitting: Podiatry

## 2019-02-27 DIAGNOSIS — M79674 Pain in right toe(s): Secondary | ICD-10-CM

## 2019-02-27 DIAGNOSIS — M79675 Pain in left toe(s): Secondary | ICD-10-CM

## 2019-02-27 DIAGNOSIS — B351 Tinea unguium: Secondary | ICD-10-CM | POA: Diagnosis not present

## 2019-02-27 DIAGNOSIS — L84 Corns and callosities: Secondary | ICD-10-CM

## 2019-02-27 NOTE — Progress Notes (Signed)
This patient presents the office with chief complaint of a painful callus on the tip of the third toe right foot and long thick nails..  This callus is painful walking and wearing her shoes.  She also says that she has long thick painful nails which she is unable to trim herself since she has arthritis.  She presents the office today for preventative foot care services.  General Appearance  Alert, conversant and in no acute stress.  Vascular  Dorsalis pedis and posterior tibial  pulses are palpable  bilaterally.  Capillary return is within normal limits  bilaterally. Temperature is within normal limits  bilaterally.  Neurologic  Senn-Weinstein monofilament wire test within normal limits  bilaterally. Muscle power within normal limits bilaterally.  Nails Thick disfigured discolored nails with subungual debris  from hallux to fifth toes bilaterally. No evidence of bacterial infection or drainage bilaterally.  Orthopedic  No limitations of motion  feet .  No crepitus or effusions noted.  Hammer toes 3rd right foot.  Skin  normotropic skin with no porokeratosis noted bilaterally.  No signs of infections or ulcers noted.  Distal clavi 3rd toe right foot.  Onychomycosis  X 10.  Clavi 3rd toe right foot.  Debride nails  X 10.  Debride clavi 3rd toe right.    RTC   3 months. Patient said her third toe was immediately painful after last visit.  Therefore individual creat pad was made for her third toe right foot.   Gardiner Barefoot DPM

## 2020-01-01 DIAGNOSIS — R82998 Other abnormal findings in urine: Secondary | ICD-10-CM | POA: Diagnosis not present

## 2020-01-01 DIAGNOSIS — I10 Essential (primary) hypertension: Secondary | ICD-10-CM | POA: Diagnosis not present

## 2020-01-01 DIAGNOSIS — I872 Venous insufficiency (chronic) (peripheral): Secondary | ICD-10-CM | POA: Diagnosis not present

## 2020-01-01 DIAGNOSIS — M545 Low back pain: Secondary | ICD-10-CM | POA: Diagnosis not present

## 2020-01-01 DIAGNOSIS — M79641 Pain in right hand: Secondary | ICD-10-CM | POA: Diagnosis not present

## 2020-01-01 DIAGNOSIS — M79642 Pain in left hand: Secondary | ICD-10-CM | POA: Diagnosis not present

## 2020-01-01 DIAGNOSIS — N3281 Overactive bladder: Secondary | ICD-10-CM | POA: Diagnosis not present

## 2020-01-01 DIAGNOSIS — M170 Bilateral primary osteoarthritis of knee: Secondary | ICD-10-CM | POA: Diagnosis not present

## 2020-01-01 DIAGNOSIS — I739 Peripheral vascular disease, unspecified: Secondary | ICD-10-CM | POA: Diagnosis not present

## 2020-01-11 DIAGNOSIS — R350 Frequency of micturition: Secondary | ICD-10-CM | POA: Diagnosis not present

## 2020-02-08 DIAGNOSIS — R350 Frequency of micturition: Secondary | ICD-10-CM | POA: Diagnosis not present

## 2020-02-29 DIAGNOSIS — H04123 Dry eye syndrome of bilateral lacrimal glands: Secondary | ICD-10-CM | POA: Diagnosis not present

## 2020-02-29 DIAGNOSIS — H43813 Vitreous degeneration, bilateral: Secondary | ICD-10-CM | POA: Diagnosis not present

## 2020-02-29 DIAGNOSIS — H52203 Unspecified astigmatism, bilateral: Secondary | ICD-10-CM | POA: Diagnosis not present

## 2020-03-14 DIAGNOSIS — R350 Frequency of micturition: Secondary | ICD-10-CM | POA: Diagnosis not present

## 2020-04-18 DIAGNOSIS — N3946 Mixed incontinence: Secondary | ICD-10-CM | POA: Diagnosis not present

## 2020-05-16 DIAGNOSIS — R350 Frequency of micturition: Secondary | ICD-10-CM | POA: Diagnosis not present

## 2020-07-04 DIAGNOSIS — R350 Frequency of micturition: Secondary | ICD-10-CM | POA: Diagnosis not present

## 2020-07-23 ENCOUNTER — Encounter: Payer: Self-pay | Admitting: Podiatry

## 2020-07-23 ENCOUNTER — Ambulatory Visit (INDEPENDENT_AMBULATORY_CARE_PROVIDER_SITE_OTHER): Payer: Medicare PPO | Admitting: Podiatry

## 2020-07-23 ENCOUNTER — Other Ambulatory Visit: Payer: Self-pay

## 2020-07-23 DIAGNOSIS — B351 Tinea unguium: Secondary | ICD-10-CM | POA: Diagnosis not present

## 2020-07-23 DIAGNOSIS — M79674 Pain in right toe(s): Secondary | ICD-10-CM

## 2020-07-23 DIAGNOSIS — M79675 Pain in left toe(s): Secondary | ICD-10-CM | POA: Diagnosis not present

## 2020-07-23 DIAGNOSIS — L84 Corns and callosities: Secondary | ICD-10-CM

## 2020-07-23 NOTE — Progress Notes (Signed)
This patient presents the office with chief complaint of a painful callus on the tip of the third toe right foot and long thick nails..  This callus is painful walking and wearing her shoes.  She also says that she has long thick painful nails which she is unable to trim herself since she has arthritis.  She presents the office today for preventative foot care services.  General Appearance  Alert, conversant and in no acute stress.  Vascular  Dorsalis pedis and posterior tibial  pulses are weakly  palpable  bilaterally.  Capillary refiill  WNL.   Bilaterally.  Absent digital hair  B/L.  Neurologic  Senn-Weinstein monofilament wire test within normal limits  bilaterally. Muscle power within normal limits bilaterally.  Nails Thick disfigured discolored nails with subungual debris  from hallux to fifth toes bilaterally. No evidence of bacterial infection or drainage bilaterally.  Orthopedic  No limitations of motion  feet .  No crepitus or effusions noted.  Hammer toes 2-4  B/L. right foot.  Skin  normotropic skin with no porokeratosis noted bilaterally.  No signs of infections or ulcers noted.  Distal clavi 3rd toe right foot.  Onychomycosis  X 10.  Clavi 3rd toe right foot.  Debride nails with nail nipper followed by dremel tool usage..  Debride clavi 3rd toe  Using # 15 blade right.     RTC  Prn.   Helane Gunther DPM

## 2020-08-05 DIAGNOSIS — G8929 Other chronic pain: Secondary | ICD-10-CM | POA: Diagnosis not present

## 2020-08-05 DIAGNOSIS — M25561 Pain in right knee: Secondary | ICD-10-CM | POA: Diagnosis not present

## 2020-08-05 DIAGNOSIS — I739 Peripheral vascular disease, unspecified: Secondary | ICD-10-CM | POA: Diagnosis not present

## 2020-08-05 DIAGNOSIS — M545 Low back pain, unspecified: Secondary | ICD-10-CM | POA: Diagnosis not present

## 2020-08-05 DIAGNOSIS — I10 Essential (primary) hypertension: Secondary | ICD-10-CM | POA: Diagnosis not present

## 2020-08-05 DIAGNOSIS — M25562 Pain in left knee: Secondary | ICD-10-CM | POA: Diagnosis not present

## 2020-08-05 DIAGNOSIS — I872 Venous insufficiency (chronic) (peripheral): Secondary | ICD-10-CM | POA: Diagnosis not present

## 2020-08-11 ENCOUNTER — Telehealth: Payer: Self-pay | Admitting: Physical Medicine and Rehabilitation

## 2020-08-11 NOTE — Telephone Encounter (Signed)
Proficient referral from Dr. Eloise Harman. Scheduled for 4/5 at 1030.

## 2020-08-11 NOTE — Telephone Encounter (Signed)
Patient called. Would like to schedule an appointment with Dr. Alvester Morin. Her call back number is [phone]

## 2020-09-02 ENCOUNTER — Encounter: Payer: Self-pay | Admitting: Physical Medicine and Rehabilitation

## 2020-09-02 ENCOUNTER — Other Ambulatory Visit: Payer: Self-pay

## 2020-09-02 ENCOUNTER — Ambulatory Visit (INDEPENDENT_AMBULATORY_CARE_PROVIDER_SITE_OTHER): Payer: Medicare PPO | Admitting: Physical Medicine and Rehabilitation

## 2020-09-02 VITALS — BP 167/60 | HR 75

## 2020-09-02 DIAGNOSIS — M25561 Pain in right knee: Secondary | ICD-10-CM | POA: Diagnosis not present

## 2020-09-02 DIAGNOSIS — G894 Chronic pain syndrome: Secondary | ICD-10-CM | POA: Diagnosis not present

## 2020-09-02 DIAGNOSIS — M546 Pain in thoracic spine: Secondary | ICD-10-CM | POA: Diagnosis not present

## 2020-09-02 DIAGNOSIS — G8929 Other chronic pain: Secondary | ICD-10-CM

## 2020-09-02 DIAGNOSIS — S29019A Strain of muscle and tendon of unspecified wall of thorax, initial encounter: Secondary | ICD-10-CM | POA: Diagnosis not present

## 2020-09-02 DIAGNOSIS — M545 Low back pain, unspecified: Secondary | ICD-10-CM

## 2020-09-02 NOTE — Progress Notes (Signed)
Left flank pain and left sided low back pain. States that she previously had pain across her low back , but the right side has not been bothering her recently.  Numeric Pain Rating Scale and Functional Assessment Average Pain 9 Pain Right Now 9 My pain is dull and aching Pain is worse with: some activites and going from sit to stand, getting out of bed Pain improves with: rest and heat/ice   In the last MONTH (on 0-10 scale) has pain interfered with the following?  1. General activity like being  able to carry out your everyday physical activities such as walking, climbing stairs, carrying groceries, or moving a chair?  Rating(8)  2. Relation with others like being able to carry out your usual social activities and roles such as  activities at home, at work and in your community. Rating(8)  3. Enjoyment of life such that you have  been bothered by emotional problems such as feeling anxious, depressed or irritable?  Rating(3)

## 2020-09-03 ENCOUNTER — Encounter: Payer: Self-pay | Admitting: Physical Medicine and Rehabilitation

## 2020-09-03 DIAGNOSIS — M545 Low back pain, unspecified: Secondary | ICD-10-CM | POA: Insufficient documentation

## 2020-09-03 DIAGNOSIS — G8929 Other chronic pain: Secondary | ICD-10-CM | POA: Insufficient documentation

## 2020-09-03 MED ORDER — TRIAMCINOLONE ACETONIDE 40 MG/ML IJ SUSP
40.0000 mg | INTRAMUSCULAR | Status: AC | PRN
  Administered 2020-09-02: 40 mg via INTRAMUSCULAR

## 2020-09-03 MED ORDER — LIDOCAINE HCL 1 % IJ SOLN
3.0000 mL | INTRAMUSCULAR | Status: AC | PRN
  Administered 2020-09-02: 3 mL

## 2020-09-03 NOTE — Progress Notes (Signed)
Susan Pitts - 87 y.o. female MRN [id number]  Date of birth: 1923/08/26  Office Visit Note: Visit Date: 09/02/2020 PCP: Jarome Matin, MD Referred by: Jarome Matin, MD  Subjective: Chief Complaint  Patient presents with  . Lower Back - Pain   HPI: Susan Pitts is a 87 y.o. female who comes in today At the request of Dossie Arbour, MD for evaluation management of recent exacerbation of chronic severe low back pain.  Per Dr. Silvano Rusk notes and the patient's report today she has had a chronic long-term history of low back pain as well as bilateral knee pain.  In fact her knee pain may be the most bothersome problem for her currently.  She has had both knees replaced by Dr. Lequita Halt at [facility].  She has close follow-up with him.  For her chronic back pain she endorses no prior lumbar surgery but with years of just back pain in general.  For her overall chronic pain she has been taking or receiving 120 tablets of hydrocodone 5 mg/month as well as diazepam 60 tablets/month with a known anxiety disorder.  This is managed by her primary care physician Dr. Eloise Harman.  She reports that sometime in late February early March she had a ground-level fall where she fell backwards.  She reports significantly increased back pain at that point.  She reports that when she saw Dr. Jarold Motto it was across the lower back left more than right but bilaterally and somewhat higher up.  Since that time the pain is changed to the point where it is no longer her lower back is bothering her but is this left low thoracic upper lumbar pain really in the left flank.  X-rays were obtained but I do not have the report today but according to the notes I have read there was no specific fractures noted.  Patient has not had MRI imaging of the spine at least any time that we can see recently.  She does not endorse any recent or past MRIs of the lower spine.  She has had multiple bouts of therapy in the past but not recently.  She  is pretty mobile and still drives and tries to stay active for her age.  She does have a history of osteoporosis.  She denies any radicular leg pain or paresthesias.  No focal weakness.  Some difficulty with change of position in standing and rotating in the bed.  She endorses 9 out of 10 dull aching pain in the left flank.  Worse getting out of bed and some activities better with rest heat and ice.  She does put the pad on there and she also uses some liniments.  Review of Systems  Musculoskeletal: Positive for back pain and joint pain.  All other systems reviewed and are negative.  Otherwise per HPI.  Assessment & Plan: Visit Diagnoses:    ICD-10-CM   1. Acute left-sided thoracic back pain  M54.6   2. Thoracic myofascial strain, initial encounter  S29.019A   3. Chronic bilateral low back pain without sciatica  M54.50    G89.29   4. Chronic pain of both knees  M25.561    M25.562    G89.29   5. Chronic pain syndrome  G89.4      Plan: Findings:  1.  Acute exacerbation of low left thoracic high upper lumbar pain status post fall approximately 4 to 5 weeks ago which was a ground-level fall.  No fractures noted.  Pain has changed over that  time to be less lower back but now this left lower thoracic area.  She does have a history of chronic pain and chronic knee pain.  She is on chronic opioid therapy but this is not helping at this point.  This is basically a sprain strain type activity of the lower thoracic region and I think to some degree this will probably resolve on its own.  She is using good conservative care at this point but has not responded to medication management and she did have an oral prednisone taper which was not very beneficial.  I think given this and given the exam showing focal trigger point along with areas to complete trigger point injection today diagnostically and hopefully therapeutically.  Clearly from the lower spine standpoint she would probably be a good candidate for  medial branch blocks and radiofrequency ablation at some point more from a chronic standpoint.  Depending on relief with trigger point injection I would probably recommend MRI of the lumbar spine as I think it would be high enough to get to the lower thoracic but we can decide at that time.  This would be if her pain does not improve.  Could also look at adding a different muscle relaxer although she does take a small amount of Valium.  If she does get relief with the trigger point injection I would look at focused physical therapy for a very short course to see if they could look at manual treatment in this area.  Trigger point injection performed today.  2.  Bilateral knee pain status post bilateral arthroplasties by Dr. Lequita Halt.  She will continue to follow-up with him but just as of note she lacks a lot of flexion of the knees and has a difficult time arising from a seated position not only because of back pain but also because of the biomechanics of her knee function.  This is likely at least some contributing factor to gait abnormality and may be the falls.    Meds & Orders: No orders of the defined types were placed in this encounter.   Orders Placed This Encounter  Procedures  . Trigger Point Inj    Follow-up: Return if symptoms worsen or fail to improve.   Procedures: Trigger Point Inj  Date/Time: 09/02/2020 10:00 AM Performed by: Tyrell Antonio, MD Authorized by: Tyrell Antonio, MD   Consent Given by:  Patient Site marked: the procedure site was marked   Timeout: prior to procedure the correct patient, procedure, and site was verified   Indications:  Pain Total # of Trigger Points:  3 or more Location: back   Needle Size:  25 G Approach:  Dorsal and lateral Medications #1:  3 mL lidocaine 1 %; 40 mg triamcinolone acetonide 40 MG/ML Patient tolerance:  Patient tolerated the procedure well with no immediate complications Comments: Trigger points palpated in the left lower  thoracic upper lumbar region and the latissimus dorsi as well as intercostals.        Clinical History: No specialty comments available.   She reports that she has never smoked. She has never used smokeless tobacco. No results for input(s): HGBA1C, LABURIC in the last 8760 hours.  Objective:  VS:  HT:    WT:   BMI:     BP:(!) 167/60  HR:75bpm  TEMP: ( )  RESP:  Physical Exam Vitals and nursing note reviewed.  Constitutional:      General: She is not in acute distress.    Appearance: Normal appearance.  She is obese. She is not ill-appearing.  HENT:     Head: Normocephalic and atraumatic.     Right Ear: External ear normal.     Left Ear: External ear normal.  Eyes:     Extraocular Movements: Extraocular movements intact.  Cardiovascular:     Rate and Rhythm: Normal rate.     Pulses: Normal pulses.  Pulmonary:     Effort: Pulmonary effort is normal. No respiratory distress.  Abdominal:     General: There is no distension.     Palpations: Abdomen is soft.  Musculoskeletal:        General: Tenderness present.     Cervical back: Neck supple.     Right lower leg: No edema.     Left lower leg: No edema.     Comments: Patient has good distal strength with no pain over the greater trochanters.  No clonus or focal weakness.  She has a difficult time going from sit to stand do to lack of flexion in both knees and also pain with standing.  She is tender along the left lower probably 12th rib but is not specific rib pain to palpation.  There is trigger points noted with twitching and taut bands and this does reproduce her pain.  She has some pain with extension and facet loading of the lumbar spine but not concordant with today's greatest level of pain.  Skin:    Findings: No erythema, lesion or rash.  Neurological:     General: No focal deficit present.     Mental Status: She is alert and oriented to person, place, and time.     Sensory: No sensory deficit.     Motor: No weakness  or abnormal muscle tone.     Coordination: Coordination normal.  Psychiatric:        Mood and Affect: Mood normal.        Behavior: Behavior normal.     Ortho Exam  Imaging: No results found.  Past Medical/Family/Surgical/Social History: Medications & Allergies reviewed per EMR, new medications updated. Patient Active Problem List   Diagnosis Date Noted  . Chronic pain 09/03/2020  . Low back pain 09/03/2020  . Pain due to onychomycosis of toenails of both feet 11/21/2018  . Clavi 11/21/2018  . Peripheral vascular disease (HCC) 12/20/2017  . Hand arthritis 01/13/2017  . Abnormal gait 12/12/2013  . Major depression, single episode 12/12/2013  . Fall 10/24/2013  . Gallstones 12/27/2011  . Microscopic hematuria   . Obesity 03/03/2011  . Essential hypertension 10/28/2010  . Long term (current) use of opiate analgesic 06/17/2009  . Anxiety disorder 06/17/2009  . Constipation 06/17/2009  . Osteoarthritis 06/17/2009   Past Medical History:  Diagnosis Date  . Arthritis   . Constipation   . Hypertension   . Microscopic hematuria   . Nocturia   . Postmenopausal atrophic vaginitis    Family History  Problem Relation Age of Onset  . Heart attack Father    Past Surgical History:  Procedure Laterality Date  . ABDOMINAL HYSTERECTOMY    . JOINT REPLACEMENT     knee replacement  . TONSILLECTOMY     Social History   Occupational History  . Not on file  Tobacco Use  . Smoking status: Never Smoker  . Smokeless tobacco: Never Used  Substance and Sexual Activity  . Alcohol use: No  . Drug use: No  . Sexual activity: Not on file

## 2020-09-08 ENCOUNTER — Telehealth: Payer: Self-pay | Admitting: Physical Medicine and Rehabilitation

## 2020-09-08 DIAGNOSIS — R109 Unspecified abdominal pain: Secondary | ICD-10-CM

## 2020-09-08 DIAGNOSIS — M549 Dorsalgia, unspecified: Secondary | ICD-10-CM

## 2020-09-08 NOTE — Telephone Encounter (Signed)
Pt called stating her back isn't feeling any better and she was told to come back in a week if this was the case; pt would like a CB to set this appt.  [phone]

## 2020-09-08 NOTE — Telephone Encounter (Signed)
Please advise 

## 2020-09-09 NOTE — Telephone Encounter (Signed)
Patient states that pain is still mostly left side- flank and rib area. Thoracic MRI ordered.

## 2020-09-23 DIAGNOSIS — S22088A Other fracture of T11-T12 vertebra, initial encounter for closed fracture: Secondary | ICD-10-CM | POA: Diagnosis not present

## 2020-09-23 DIAGNOSIS — Z8781 Personal history of (healed) traumatic fracture: Secondary | ICD-10-CM | POA: Diagnosis not present

## 2020-09-26 DIAGNOSIS — Z96653 Presence of artificial knee joint, bilateral: Secondary | ICD-10-CM | POA: Diagnosis not present

## 2020-09-26 DIAGNOSIS — Z96652 Presence of left artificial knee joint: Secondary | ICD-10-CM | POA: Diagnosis not present

## 2020-09-26 DIAGNOSIS — Z96651 Presence of right artificial knee joint: Secondary | ICD-10-CM | POA: Diagnosis not present

## 2020-09-26 DIAGNOSIS — Z96659 Presence of unspecified artificial knee joint: Secondary | ICD-10-CM | POA: Diagnosis not present

## 2020-09-29 ENCOUNTER — Telehealth: Payer: Self-pay | Admitting: Physical Medicine and Rehabilitation

## 2020-09-29 NOTE — Telephone Encounter (Signed)
Called pt and she will call us back

## 2020-09-29 NOTE — Telephone Encounter (Signed)
Please inform her that [facility], where she had the MRI completed, had not sent [place] notice of report or that is was done. She does have a small acute compression fracture at T11 that is th likely cause of the pain. This should heal and pain will diminish. We could send her for consultation of kyphoplasty (Cement placed  in the bone) - We can see her for OV to go over MRI. Would be great if she could pick up MRI CD.

## 2020-09-29 NOTE — Telephone Encounter (Signed)
Pt called stating she had her MRI done almost a week ago and would like to get her results over the phone since she doesn't have a way to come into the office. Pt would like a CB please  [phone]

## 2020-09-29 NOTE — Telephone Encounter (Signed)
Pt called back and sch 5/17

## 2020-09-29 NOTE — Telephone Encounter (Signed)
Please advise 

## 2020-10-01 ENCOUNTER — Telehealth: Payer: Self-pay | Admitting: Physical Medicine and Rehabilitation

## 2020-10-01 NOTE — Telephone Encounter (Signed)
Pt needs to know if you have received the MRI disc? She wants an appt too. CB [phone]

## 2020-10-03 NOTE — Telephone Encounter (Signed)
I have placed the MRI disc on your desk.

## 2020-10-03 NOTE — Telephone Encounter (Signed)
Spoke with pt and her son, I have informed to try and retrieve her MRI disc.

## 2020-10-10 DIAGNOSIS — E669 Obesity, unspecified: Secondary | ICD-10-CM | POA: Diagnosis not present

## 2020-10-10 DIAGNOSIS — I739 Peripheral vascular disease, unspecified: Secondary | ICD-10-CM | POA: Diagnosis not present

## 2020-10-10 DIAGNOSIS — I10 Essential (primary) hypertension: Secondary | ICD-10-CM | POA: Diagnosis not present

## 2020-10-10 DIAGNOSIS — I872 Venous insufficiency (chronic) (peripheral): Secondary | ICD-10-CM | POA: Diagnosis not present

## 2020-10-14 ENCOUNTER — Encounter: Payer: Self-pay | Admitting: Physical Medicine and Rehabilitation

## 2020-10-14 ENCOUNTER — Other Ambulatory Visit: Payer: Self-pay

## 2020-10-14 ENCOUNTER — Ambulatory Visit: Payer: Medicare PPO | Admitting: Physical Medicine and Rehabilitation

## 2020-10-14 VITALS — BP 170/54 | HR 74

## 2020-10-14 DIAGNOSIS — M546 Pain in thoracic spine: Secondary | ICD-10-CM | POA: Diagnosis not present

## 2020-10-14 DIAGNOSIS — S22080A Wedge compression fracture of T11-T12 vertebra, initial encounter for closed fracture: Secondary | ICD-10-CM

## 2020-10-14 DIAGNOSIS — G8929 Other chronic pain: Secondary | ICD-10-CM | POA: Diagnosis not present

## 2020-10-14 DIAGNOSIS — W19XXXD Unspecified fall, subsequent encounter: Secondary | ICD-10-CM

## 2020-10-14 NOTE — Progress Notes (Signed)
Susan Pitts - 87 y.o. female MRN [id number]  Date of birth: Oct 06, 1923  Office Visit Note: Visit Date: 10/14/2020 PCP: Jarome Matin, MD Referred by: Jarome Matin, MD  Subjective: Chief Complaint  Patient presents with  . Middle Back - Pain   HPI: Susan Pitts is a 87 y.o. female who comes in today For evaluation management of thoracic pain and left flank pain status post MRI of the thoracic spine.  She comes in today still feeling that her lumbar spine is much better and her thoracic spine is actually improving to a great degree.  She was status post fall and I can be reviewed my last notes.  MRI today that was done at [facility] imaging and we do have the CD to review with her.  It does show acute to subacute compression fracture 20% no retropulsion at T11.  This is likely the source of her pain at this point and this will continue to get better.  Without any retropulsion or instability I think she will do fine with just continued following of this.  Her biggest complaints are still her bilateral knees status post remote knee arthroplasties by Dr. Lequita Halt.  She was not happy seeing him do to not be able to see him directly and she does report significant knee pain at this point.  She will continue to follow-up with Dr. Lequita Halt for her knees.  We will see her back as needed in terms of her back or her lower back pain.  She has had a chronic pain issue for quite some time with her spine.  Review of Systems  Musculoskeletal: Positive for back pain and joint pain.  All other systems reviewed and are negative.  Otherwise per HPI.  Assessment & Plan: Visit Diagnoses:    ICD-10-CM   1. Closed wedge compression fracture of T11 vertebra, initial encounter (HCC)  S22.080A   2. Chronic left-sided thoracic back pain  M54.6    G89.29   3. Fall, subsequent encounter  W19.XXXD      Plan: No additional findings.   Meds & Orders: No orders of the defined types were placed in this encounter.  No  orders of the defined types were placed in this encounter.   Follow-up: Return if symptoms worsen or fail to improve.   Procedures: No procedures performed      Clinical History: Sherron Ales, MD - 09/24/2020  Formatting of this note might be different from the original.  EXAMINATION: MRI thoracic spine without contrast   CLINICAL INDICATION: Back pain   TECHNIQUE: MRI thoracic spine protocol without contrast   COMPARISON: None   FINDINGS:   Alignment: Accentuated kyphosis   Bone marrow: There is edema in the T11 vertebra associated with a benign appearing compression fracture of the superior endplate. There is about 20% loss of vertebral height anteriorly.   There is mild edema in the anterior inferior corner of T10.   There are old mild compression fractures of T5 and T6   The bone marrow signal is otherwise normal.   Thoracic spinal cord: Normal   Intervertebral discs: There is no thoracic disc herniation. Thereis mild thoracic degenerative disc disease.   Paraspinal tissues: Normal    IMPRESSION:   Acute/subacute benign compression fracture of T11 with 20% loss of vertebral height   Old compression fractures of T5 and T6   Electronically Signed by: Domingo Dimes on 09/24/2020 11:12 AM   She reports that she has never smoked. She has  never used smokeless tobacco. No results for input(s): HGBA1C, LABURIC in the last 8760 hours.  Objective:  VS:  HT:    WT:   BMI:     BP:(!) 170/54  HR:74bpm  TEMP: ( )  RESP:  Physical Exam Vitals and nursing note reviewed.  Constitutional:      General: She is not in acute distress.    Appearance: Normal appearance. She is not ill-appearing.  HENT:     Head: Normocephalic and atraumatic.     Right Ear: External ear normal.     Left Ear: External ear normal.  Eyes:     Extraocular Movements: Extraocular movements intact.  Cardiovascular:     Rate and Rhythm: Normal rate.     Pulses: Normal pulses.  Pulmonary:      Effort: Pulmonary effort is normal. No respiratory distress.  Abdominal:     General: There is no distension.     Palpations: Abdomen is soft.  Musculoskeletal:        General: Tenderness present.     Cervical back: Neck supple.     Right lower leg: No edema.     Left lower leg: No edema.     Comments: Patient has good distal strength with no pain over the greater trochanters.  No clonus or focal weakness.  She has a very difficult time going from sit to stand with not really good flexion of both knees.  She does have a small area above the right knee that feels like there is some mobile induration there but no localized swelling.  Well-healed surgical scars.  Back pain painful to pressing over the vertebral bodies of the thoracic spine.  Skin:    Findings: No erythema, lesion or rash.  Neurological:     General: No focal deficit present.     Mental Status: She is alert and oriented to person, place, and time.     Sensory: No sensory deficit.     Motor: No weakness or abnormal muscle tone.     Coordination: Coordination normal.  Psychiatric:        Mood and Affect: Mood normal.        Behavior: Behavior normal.     Ortho Exam  Imaging: No results found.  Past Medical/Family/Surgical/Social History: Medications & Allergies reviewed per EMR, new medications updated. Patient Active Problem List   Diagnosis Date Noted  . Chronic pain 09/03/2020  . Low back pain 09/03/2020  . Pain due to onychomycosis of toenails of both feet 11/21/2018  . Clavi 11/21/2018  . Peripheral vascular disease (HCC) 12/20/2017  . Hand arthritis 01/13/2017  . Abnormal gait 12/12/2013  . Major depression, single episode 12/12/2013  . Fall 10/24/2013  . Gallstones 12/27/2011  . Microscopic hematuria   . Obesity 03/03/2011  . Essential hypertension 10/28/2010  . Long term (current) use of opiate analgesic 06/17/2009  . Anxiety disorder 06/17/2009  . Constipation 06/17/2009  . Osteoarthritis 06/17/2009    Past Medical History:  Diagnosis Date  . Arthritis   . Constipation   . Hypertension   . Microscopic hematuria   . Nocturia   . Postmenopausal atrophic vaginitis    Family History  Problem Relation Age of Onset  . Heart attack Father    Past Surgical History:  Procedure Laterality Date  . ABDOMINAL HYSTERECTOMY    . JOINT REPLACEMENT     knee replacement  . TONSILLECTOMY     Social History   Occupational History  . Not on file  Tobacco Use  . Smoking status: Never Smoker  . Smokeless tobacco: Never Used  Substance and Sexual Activity  . Alcohol use: No  . Drug use: No  . Sexual activity: Not on file

## 2020-10-14 NOTE — Progress Notes (Signed)
Has pain in mid back on left "every once in a while." Here for MRI review. Pain has improved some since first visit. States that her knees are causing the most pain. Numeric Pain Rating Scale and Functional Assessment Average Pain 7   In the last MONTH (on 0-10 scale) has pain interfered with the following?  1. General activity like being  able to carry out your everyday physical activities such as walking, climbing stairs, carrying groceries, or moving a chair?  Rating(8)

## 2020-10-15 ENCOUNTER — Encounter: Payer: Self-pay | Admitting: Physical Medicine and Rehabilitation

## 2020-11-30 ENCOUNTER — Inpatient Hospital Stay (HOSPITAL_BASED_OUTPATIENT_CLINIC_OR_DEPARTMENT_OTHER)
Admission: EM | Admit: 2020-11-30 | Discharge: 2020-12-06 | DRG: 291 | Disposition: A | Discharge status: Skilled Nursing Facility | Payer: Medicare PPO | Attending: Internal Medicine | Admitting: Internal Medicine

## 2020-11-30 ENCOUNTER — Encounter (HOSPITAL_BASED_OUTPATIENT_CLINIC_OR_DEPARTMENT_OTHER): Payer: Self-pay | Admitting: Obstetrics and Gynecology

## 2020-11-30 ENCOUNTER — Emergency Department (HOSPITAL_BASED_OUTPATIENT_CLINIC_OR_DEPARTMENT_OTHER): Payer: Medicare PPO

## 2020-11-30 DIAGNOSIS — I110 Hypertensive heart disease with heart failure: Secondary | ICD-10-CM | POA: Diagnosis not present

## 2020-11-30 DIAGNOSIS — Z8249 Family history of ischemic heart disease and other diseases of the circulatory system: Secondary | ICD-10-CM

## 2020-11-30 DIAGNOSIS — M1990 Unspecified osteoarthritis, unspecified site: Secondary | ICD-10-CM | POA: Diagnosis present

## 2020-11-30 DIAGNOSIS — I5043 Acute on chronic combined systolic (congestive) and diastolic (congestive) heart failure: Secondary | ICD-10-CM | POA: Diagnosis not present

## 2020-11-30 DIAGNOSIS — R609 Edema, unspecified: Secondary | ICD-10-CM

## 2020-11-30 DIAGNOSIS — R531 Weakness: Secondary | ICD-10-CM | POA: Diagnosis present

## 2020-11-30 DIAGNOSIS — F419 Anxiety disorder, unspecified: Secondary | ICD-10-CM | POA: Diagnosis not present

## 2020-11-30 DIAGNOSIS — R2681 Unsteadiness on feet: Secondary | ICD-10-CM | POA: Diagnosis not present

## 2020-11-30 DIAGNOSIS — I272 Pulmonary hypertension, unspecified: Secondary | ICD-10-CM | POA: Diagnosis present

## 2020-11-30 DIAGNOSIS — I130 Hypertensive heart and chronic kidney disease with heart failure and stage 1 through stage 4 chronic kidney disease, or unspecified chronic kidney disease: Principal | ICD-10-CM | POA: Diagnosis present

## 2020-11-30 DIAGNOSIS — M158 Other polyosteoarthritis: Secondary | ICD-10-CM | POA: Diagnosis not present

## 2020-11-30 DIAGNOSIS — R109 Unspecified abdominal pain: Secondary | ICD-10-CM

## 2020-11-30 DIAGNOSIS — Z888 Allergy status to other drugs, medicaments and biological substances status: Secondary | ICD-10-CM | POA: Diagnosis not present

## 2020-11-30 DIAGNOSIS — I493 Ventricular premature depolarization: Secondary | ICD-10-CM | POA: Diagnosis present

## 2020-11-30 DIAGNOSIS — I499 Cardiac arrhythmia, unspecified: Secondary | ICD-10-CM | POA: Diagnosis not present

## 2020-11-30 DIAGNOSIS — M6281 Muscle weakness (generalized): Secondary | ICD-10-CM | POA: Diagnosis not present

## 2020-11-30 DIAGNOSIS — I959 Hypotension, unspecified: Secondary | ICD-10-CM | POA: Diagnosis not present

## 2020-11-30 DIAGNOSIS — F32A Depression, unspecified: Secondary | ICD-10-CM | POA: Diagnosis present

## 2020-11-30 DIAGNOSIS — R0902 Hypoxemia: Secondary | ICD-10-CM | POA: Diagnosis not present

## 2020-11-30 DIAGNOSIS — R1013 Epigastric pain: Secondary | ICD-10-CM | POA: Diagnosis not present

## 2020-11-30 DIAGNOSIS — Z20822 Contact with and (suspected) exposure to covid-19: Secondary | ICD-10-CM | POA: Diagnosis present

## 2020-11-30 DIAGNOSIS — N1832 Chronic kidney disease, stage 3b: Secondary | ICD-10-CM | POA: Diagnosis present

## 2020-11-30 DIAGNOSIS — R0602 Shortness of breath: Secondary | ICD-10-CM | POA: Diagnosis not present

## 2020-11-30 DIAGNOSIS — Z885 Allergy status to narcotic agent status: Secondary | ICD-10-CM | POA: Diagnosis not present

## 2020-11-30 DIAGNOSIS — R600 Localized edema: Secondary | ICD-10-CM | POA: Diagnosis not present

## 2020-11-30 DIAGNOSIS — R5381 Other malaise: Secondary | ICD-10-CM | POA: Diagnosis present

## 2020-11-30 DIAGNOSIS — Z79899 Other long term (current) drug therapy: Secondary | ICD-10-CM | POA: Diagnosis not present

## 2020-11-30 DIAGNOSIS — I509 Heart failure, unspecified: Secondary | ICD-10-CM | POA: Diagnosis not present

## 2020-11-30 DIAGNOSIS — Z880 Allergy status to penicillin: Secondary | ICD-10-CM

## 2020-11-30 DIAGNOSIS — I10 Essential (primary) hypertension: Secondary | ICD-10-CM | POA: Diagnosis not present

## 2020-11-30 DIAGNOSIS — I5033 Acute on chronic diastolic (congestive) heart failure: Secondary | ICD-10-CM | POA: Diagnosis not present

## 2020-11-30 DIAGNOSIS — I739 Peripheral vascular disease, unspecified: Secondary | ICD-10-CM | POA: Diagnosis not present

## 2020-11-30 DIAGNOSIS — I491 Atrial premature depolarization: Secondary | ICD-10-CM | POA: Diagnosis not present

## 2020-11-30 DIAGNOSIS — Z7982 Long term (current) use of aspirin: Secondary | ICD-10-CM

## 2020-11-30 DIAGNOSIS — R0600 Dyspnea, unspecified: Secondary | ICD-10-CM | POA: Diagnosis present

## 2020-11-30 DIAGNOSIS — Z96659 Presence of unspecified artificial knee joint: Secondary | ICD-10-CM | POA: Diagnosis present

## 2020-11-30 DIAGNOSIS — R0609 Other forms of dyspnea: Secondary | ICD-10-CM | POA: Diagnosis present

## 2020-11-30 DIAGNOSIS — G894 Chronic pain syndrome: Secondary | ICD-10-CM | POA: Diagnosis not present

## 2020-11-30 DIAGNOSIS — R1312 Dysphagia, oropharyngeal phase: Secondary | ICD-10-CM | POA: Diagnosis not present

## 2020-11-30 DIAGNOSIS — G8929 Other chronic pain: Secondary | ICD-10-CM

## 2020-11-30 DIAGNOSIS — Z743 Need for continuous supervision: Secondary | ICD-10-CM | POA: Diagnosis not present

## 2020-11-30 LAB — TROPONIN I (HIGH SENSITIVITY)
Troponin I (High Sensitivity): 40 ng/L — ABNORMAL HIGH (ref <18)
Troponin I (High Sensitivity): 47 ng/L — ABNORMAL HIGH (ref <18)
Troponin I (High Sensitivity): 61 ng/L — ABNORMAL HIGH (ref <18)

## 2020-11-30 LAB — CBC WITH DIFFERENTIAL/PLATELET
Abs Immature Granulocytes: 0.02 10*3/uL (ref 0.00–0.07)
Basophils Absolute: 0 10*3/uL (ref 0.0–0.1)
Basophils Relative: 1 %
Eosinophils Absolute: 0.1 10*3/uL (ref 0.0–0.5)
Eosinophils Relative: 1 %
HCT: 37.9 % (ref 36.0–46.0)
Hemoglobin: 12.3 g/dL (ref 12.0–15.0)
Immature Granulocytes: 0 %
Lymphocytes Relative: 13 %
Lymphs Abs: 1.1 10*3/uL (ref 0.7–4.0)
MCH: 31.2 pg (ref 26.0–34.0)
MCHC: 32.5 g/dL (ref 30.0–36.0)
MCV: 96.2 fL (ref 80.0–100.0)
Monocytes Absolute: 0.6 10*3/uL (ref 0.1–1.0)
Monocytes Relative: 7 %
Neutro Abs: 6.5 10*3/uL (ref 1.7–7.7)
Neutrophils Relative %: 78 %
Platelets: 272 10*3/uL (ref 150–400)
RBC: 3.94 MIL/uL (ref 3.87–5.11)
RDW: 14.8 % (ref 11.5–15.5)
WBC: 8.3 10*3/uL (ref 4.0–10.5)
nRBC: 0 % (ref 0.0–0.2)

## 2020-11-30 LAB — URINALYSIS, ROUTINE W REFLEX MICROSCOPIC
Bilirubin Urine: NEGATIVE
Glucose, UA: NEGATIVE mg/dL
Hgb urine dipstick: NEGATIVE
Ketones, ur: NEGATIVE mg/dL
Leukocytes,Ua: NEGATIVE
Nitrite: NEGATIVE
Protein, ur: NEGATIVE mg/dL
Specific Gravity, Urine: 1.009 (ref 1.005–1.030)
pH: 7 (ref 5.0–8.0)

## 2020-11-30 LAB — BASIC METABOLIC PANEL
Anion gap: 9 (ref 5–15)
BUN: 31 mg/dL — ABNORMAL HIGH (ref 8–23)
CO2: 27 mmol/L (ref 22–32)
Calcium: 9.3 mg/dL (ref 8.9–10.3)
Chloride: 105 mmol/L (ref 98–111)
Creatinine, Ser: 1.09 mg/dL — ABNORMAL HIGH (ref 0.44–1.00)
GFR, Estimated: 47 mL/min — ABNORMAL LOW (ref >60)
Glucose, Bld: 108 mg/dL — ABNORMAL HIGH (ref 70–99)
Potassium: 4.5 mmol/L (ref 3.5–5.1)
Sodium: 141 mmol/L (ref 135–145)

## 2020-11-30 LAB — RESP PANEL BY RT-PCR (FLU A&B, COVID) ARPGX2
Influenza A by PCR: NEGATIVE
Influenza B by PCR: NEGATIVE
SARS Coronavirus 2 by RT PCR: NEGATIVE

## 2020-11-30 LAB — BRAIN NATRIURETIC PEPTIDE: B Natriuretic Peptide: 1310.9 pg/mL — ABNORMAL HIGH (ref 0.0–100.0)

## 2020-11-30 LAB — MAGNESIUM: Magnesium: 1.7 mg/dL (ref 1.7–2.4)

## 2020-11-30 MED ORDER — FUROSEMIDE 10 MG/ML IJ SOLN: 20.0000 mg | Freq: Two times a day (BID) | INTRAMUSCULAR | Status: DC

## 2020-11-30 MED ORDER — SODIUM CHLORIDE 0.9% FLUSH
3.0000 mL | Freq: Two times a day (BID) | INTRAVENOUS | Status: DC
  Administered 2020-11-30 – 2020-12-04 (×9): 3 mL via INTRAVENOUS

## 2020-11-30 MED ORDER — MULTIVITAMINS PO CAPS: 1.0000 | ORAL_CAPSULE | Freq: Every day | ORAL | Status: DC

## 2020-11-30 MED ORDER — ENOXAPARIN SODIUM 30 MG/0.3ML IJ SOSY
30.0000 mg | PREFILLED_SYRINGE | INTRAMUSCULAR | Status: DC
  Administered 2020-11-30: 30 mg via SUBCUTANEOUS
  Filled 2020-11-30: qty 0.3

## 2020-11-30 MED ORDER — RALOXIFENE HCL 60 MG PO TABS
60.0000 mg | ORAL_TABLET | Freq: Every day | ORAL | Status: DC
  Administered 2020-12-01 – 2020-12-05 (×5): 60 mg via ORAL
  Filled 2020-11-30 (×5): qty 1

## 2020-11-30 MED ORDER — FUROSEMIDE 10 MG/ML IJ SOLN
20.0000 mg | Freq: Two times a day (BID) | INTRAMUSCULAR | Status: DC
  Administered 2020-11-30 – 2020-12-01 (×2): 20 mg via INTRAVENOUS
  Filled 2020-11-30 (×2): qty 2

## 2020-11-30 MED ORDER — FUROSEMIDE 10 MG/ML IJ SOLN
20.0000 mg | Freq: Once | INTRAMUSCULAR | Status: AC
  Administered 2020-11-30: 20 mg via INTRAVENOUS
  Filled 2020-11-30: qty 2

## 2020-11-30 MED ORDER — ASPIRIN EC 81 MG PO TBEC
81.0000 mg | DELAYED_RELEASE_TABLET | Freq: Every day | ORAL | Status: DC
  Administered 2020-12-01 – 2020-12-05 (×5): 81 mg via ORAL
  Filled 2020-11-30 (×5): qty 1

## 2020-11-30 MED ORDER — ACETAMINOPHEN 325 MG PO TABS: 650.0000 mg | ORAL_TABLET | Freq: Four times a day (QID) | ORAL | Status: DC | PRN

## 2020-11-30 MED ORDER — DIAZEPAM 5 MG PO TABS
5.0000 mg | ORAL_TABLET | Freq: Two times a day (BID) | ORAL | Status: DC | PRN
  Administered 2020-11-30 – 2020-12-03 (×4): 5 mg via ORAL
  Filled 2020-11-30 (×4): qty 1

## 2020-11-30 MED ORDER — BENAZEPRIL HCL 40 MG PO TABS: 40.0000 mg | ORAL_TABLET | Freq: Every day | ORAL | Status: DC

## 2020-11-30 MED ORDER — POLYETHYLENE GLYCOL 3350 17 G PO PACK
17.0000 g | PACK | Freq: Every day | ORAL | Status: DC | PRN
  Administered 2020-12-01 – 2020-12-04 (×3): 17 g via ORAL
  Filled 2020-11-30 (×3): qty 1

## 2020-11-30 MED ORDER — ADULT MULTIVITAMIN W/MINERALS CH
1.0000 | ORAL_TABLET | Freq: Every day | ORAL | Status: DC
  Administered 2020-12-01 – 2020-12-05 (×5): 1 via ORAL
  Filled 2020-11-30 (×5): qty 1

## 2020-11-30 MED ORDER — METOPROLOL SUCCINATE ER 50 MG PO TB24
50.0000 mg | ORAL_TABLET | Freq: Every day | ORAL | Status: DC
  Administered 2020-11-30 – 2020-12-05 (×6): 50 mg via ORAL
  Filled 2020-11-30 (×6): qty 1

## 2020-11-30 MED ORDER — MECLIZINE HCL 25 MG PO TABS: 12.5000 mg | ORAL_TABLET | Freq: Two times a day (BID) | ORAL | Status: DC | PRN

## 2020-11-30 MED ORDER — METOPROLOL SUCCINATE ER 50 MG PO TB24: 50.0000 mg | ORAL_TABLET | Freq: Every day | ORAL | Status: DC

## 2020-11-30 MED ORDER — METOPROLOL TARTRATE 5 MG/5ML IV SOLN
2.5000 mg | Freq: Four times a day (QID) | INTRAVENOUS | Status: DC | PRN
  Administered 2020-12-01: 2.5 mg via INTRAVENOUS
  Filled 2020-11-30: qty 5

## 2020-11-30 MED ORDER — BENAZEPRIL HCL 40 MG PO TABS
40.0000 mg | ORAL_TABLET | Freq: Every day | ORAL | Status: DC
  Administered 2020-11-30 – 2020-12-05 (×6): 40 mg via ORAL
  Filled 2020-11-30 (×6): qty 1

## 2020-11-30 MED ORDER — ACETAMINOPHEN 650 MG RE SUPP: 650.0000 mg | Freq: Four times a day (QID) | RECTAL | Status: DC | PRN

## 2020-11-30 MED ORDER — HYDROCODONE-ACETAMINOPHEN 5-325 MG PO TABS
1.0000 | ORAL_TABLET | Freq: Four times a day (QID) | ORAL | Status: DC | PRN
  Administered 2020-11-30 – 2020-12-05 (×7): 1 via ORAL
  Filled 2020-11-30 (×7): qty 1

## 2020-11-30 NOTE — ED Triage Notes (Signed)
Patient reports to the ER for leg swelling, SOB, and weeping of the legs. Patient denies hx of CHF but is on a beta blocker and lasix.

## 2020-11-30 NOTE — Plan of Care (Signed)

## 2020-11-30 NOTE — H&P (Signed)
History and Physical   Susan Pitts ZOX:[id number] DOB: 02/26/1924 DOA: 11/30/2020  PCP: Jarome MatinPaterson, [name], MD   Patient coming from: Home  Chief Complaint: Shortness of breath, edema  HPI: Susan Pitts is a 87 y.o. female with medical history significant of anxiety, depression, chronic pain, hypertension, peripheral vascular disease, arthritis who presents with worsening edema and shortness of breath. Patient is not quite sure when it started but she has had at least 3 weeks of shortness of breath that may have worsened somewhat in that time.  She is also had 4 days of worsening edema with some weeping from her legs.  She denies any history of heart failure.  She is taking Lasix however she states this is for her leg swelling and she was switched off of hydrochlorothiazide in favor of Lasix recently. She denies fevers, chills, chest pain, abdominal pain, constipation, diarrhea, nausea, vomiting.  ED Course: Vital signs in ED significant for blood pressure in 160s 180s systolic.  Lab work-up showed BMP with creatinine of 1.09 from unknown baseline.  CBC within normal limits.  Troponin 40 and then 47 on repeat.  BNP elevated to 1310.  Respiratory panel for flu and COVID-negative.  Urinalysis normal.  Chest x-ray with changes consistent with mild edema.  Patient was given a dose of Lasix in the ED.  Review of Systems: As per HPI otherwise all other systems reviewed and are negative.  Past Medical History:  Diagnosis Date   Arthritis    Constipation    Hypertension    Microscopic hematuria    Nocturia    Postmenopausal atrophic vaginitis     Past Surgical History:  Procedure Laterality Date   ABDOMINAL HYSTERECTOMY     JOINT REPLACEMENT     knee replacement   TONSILLECTOMY      Social History  reports that she has never smoked. She has never been exposed to tobacco smoke. She has never used smokeless tobacco. She reports that she does not drink alcohol and does not use  drugs.  Allergies  Allergen Reactions   Cardura [Doxazosin Mesylate]    Cardura [Doxazosin]     Other reaction(s): Unknown   Codeine    Fosamax [Alendronate]     Other reaction(s): gastritis   Inderal [Propranolol]    Penicillins     Family History  Problem Relation Age of Onset   Heart attack Father   Reviewed on admission  Prior to Admission medications   Medication Sig Start Date End Date Taking? Authorizing Provider  aspirin 81 MG tablet Take 81 mg by mouth daily.   Yes [provider]  benazepril (LOTENSIN) 40 MG tablet Take 40 mg by mouth daily.   Yes [provider]  Calcium Carbonate-Vitamin D (CALTRATE 600+D PO) Take 1 tablet by mouth daily.    Yes [provider]  Cholecalciferol (VITAMIN D-3 PO) Take 2,000 Units by mouth daily.    Yes [provider]  diazepam (VALIUM) 5 MG tablet Take 5 mg by mouth 2 (two) times daily.    Yes [provider]  furosemide (LASIX) 20 MG tablet Take 20 mg by mouth.   Yes [provider]  HYDROcodone-acetaminophen (NORCO/VICODIN) 5-325 MG tablet Take 1 tablet by mouth every 6 (six) hours as needed for moderate pain.   Yes [provider]  metoprolol succinate (TOPROL-XL) 50 MG 24 hr tablet 1 tablet 01/04/20  Yes [provider]  Multiple Vitamin (MULTIVITAMIN) capsule Take 1 capsule by mouth daily.  Yes [provider]  raloxifene (EVISTA) 60 MG tablet Take 60 mg by mouth daily. 12/28/17  Yes [provider]  vitamin B-12 (CYANOCOBALAMIN) 500 MCG tablet Take 500 mcg by mouth daily.   Yes [provider]  hydrochlorothiazide (MICROZIDE) 12.5 MG capsule Take 12.5 mg by mouth every morning. 12/09/17   [provider]  Lidocaine HCl 4 % CREA apply to painful wrist and fingers daily as needed 09/23/16   [provider]  Liniments (BLUE-EMU SUPER STRENGTH) CREA apply to painful finger joints as needed 12/15/15   [provider]   loratadine (CLARITIN) 10 MG tablet Take 10 mg by mouth daily as needed for allergies.    [provider]  meclizine (ANTIVERT) 25 MG tablet Take one tablet by mouth three times every day as needed for dizziness 01/11/13   [provider]    Physical Exam: Vitals:   11/30/20 1300 11/30/20 1315 11/30/20 1330 11/30/20 1755  BP: (!) 178/71 (!) 170/65 (!) 172/78 (!) 186/66  Pulse: 61 69 70 69  Resp: (!) 23 20 20 18   Temp:    97.6 F (36.4 C)  TempSrc:    Axillary  SpO2: 94% 94% 95% 94%  Weight:    70.6 kg  Height:       Physical Exam Constitutional:      General: She is not in acute distress.    Appearance: Normal appearance.  HENT:     Head: Normocephalic and atraumatic.     Mouth/Throat:     Mouth: Mucous membranes are dry.     Pharynx: Oropharynx is clear.  Eyes:     Extraocular Movements: Extraocular movements intact.     Pupils: Pupils are equal, round, and reactive to light.  Cardiovascular:     Rate and Rhythm: Normal rate and regular rhythm.     Pulses: Normal pulses.     Heart sounds: Normal heart sounds.  Pulmonary:     Effort: Pulmonary effort is normal. No respiratory distress.     Breath sounds: Normal breath sounds.  Abdominal:     General: Bowel sounds are normal. There is no distension.     Palpations: Abdomen is soft.     Tenderness: There is no abdominal tenderness.  Musculoskeletal:        General: No swelling or deformity.     Right lower leg: Edema present.     Left lower leg: Edema present.  Skin:    General: Skin is warm and dry.  Neurological:     General: No focal deficit present.     Mental Status: Mental status is at baseline.   Labs on Admission: I have personally reviewed following labs and imaging studies  CBC: Recent Labs  Lab 11/30/20 1130  WBC 8.3  NEUTROABS 6.5  HGB 12.3  HCT 37.9  MCV 96.2  PLT 272    Basic Metabolic Panel: Recent Labs  Lab 11/30/20 1130  NA 141  K 4.5  CL 105  CO2 27  GLUCOSE  108*  BUN 31*  CREATININE 1.09*  CALCIUM 9.3    GFR: Estimated Creatinine Clearance: 28.4 mL/min (A) (by C-G formula based on SCr of 1.09 mg/dL (H)).  Liver Function Tests: No results for input(s): AST, ALT, ALKPHOS, BILITOT, PROT, ALBUMIN in the last 168 hours.  Urine analysis:    Component Value Date/Time   COLORURINE COLORLESS (A) 11/30/2020 1218   APPEARANCEUR CLEAR 11/30/2020 1218   LABSPEC 1.009 11/30/2020 1218   PHURINE 7.0 11/30/2020  1218   GLUCOSEU NEGATIVE 11/30/2020 1218   HGBUR NEGATIVE 11/30/2020 1218   BILIRUBINUR NEGATIVE 11/30/2020 1218   KETONESUR NEGATIVE 11/30/2020 1218   PROTEINUR NEGATIVE 11/30/2020 1218   NITRITE NEGATIVE 11/30/2020 1218   LEUKOCYTESUR NEGATIVE 11/30/2020 1218    Radiological Exams on Admission: DG Chest Port 1 View  Result Date: 11/30/2020 CLINICAL DATA:  87 year old with shortness of breath. EXAM: PORTABLE CHEST 1 VIEW COMPARISON:  None. FINDINGS: Single view of the chest demonstrates prominent lung and central vascular structures. Low lung volumes. Heart size is within normal limits. Negative for pneumothorax. Atherosclerotic calcifications at the aortic arch. No acute bone abnormality. IMPRESSION: Slightly prominent lung and vascular markings. Findings could represent chronic changes versus mild edema. No focal airspace disease or lung consolidation. Electronically Signed   By: Richarda Overlie M.D.   On: 11/30/2020 12:12    EKG: Independently reviewed.  Sinus rhythm at 74 bpm.  PVCs noted.  No previous to compare.  Assessment/Plan Principal Problem:   Acute CHF (congestive heart failure) (HCC) Active Problems:   Anxiety disorder   Chronic pain   Essential hypertension   Peripheral vascular disease (HCC)  New onset CHF exacerbation ?AKI > Patient presenting with worsening shortness of breath especially on exertion and worsening edema. > She denies history of CHF.  Has BNP elevated to 1310 and troponin of 40 and 47 on repeat.   Creatinine is mildly elevated to 1.09 with unknown baseline. - Monitor on telemetry - Strict ins and outs, daily weights - Trend renal function and electrolytes - Echocardiogram - Continue Lasix 20 mg IV twice daily - Continue home benazepril and metoprolol - Check additional troponin to see if this is peaked  Hypertension - Continue home benazepril and metoprolol - Lasix as above  Chronic pain - Continue home as needed Norco  Anxiety - Continue home diazepam  Peripheral vascular disease - Continue home aspirin  DVT prophylaxis: Lovenox  Code Status:   Full, states she will continue to think about this and discuss with family Family Communication:  Daughter updated at bedside Disposition Plan:   Patient is from:  Home  Anticipated DC to:  Home  Anticipated DC date:  2 to 3 days  Anticipated DC barriers: None  Consults called:  None  Admission status:  Inpatient, telemetry   Severity of Illness: The appropriate patient status for this patient is INPATIENT. Inpatient status is judged to be reasonable and necessary in order to provide the required intensity of service to ensure the patient's safety. The patient's presenting symptoms, physical exam findings, and initial radiographic and laboratory data in the context of their chronic comorbidities is felt to place them at high risk for further clinical deterioration. Furthermore, it is not anticipated that the patient will be medically stable for discharge from the hospital within 2 midnights of admission. The following factors support the patient status of inpatient.   " The patient's presenting symptoms include shortness of breath, edema. " The worrisome physical exam findings include edema. " The initial radiographic and laboratory data are worrisome because of creatinine of 1.09 with unknown baseline, BNP 1310, troponin 40 and 47 on repeat.  Chest x-ray with changes consistent with mild edema.. " The chronic co-morbidities  include anxiety, depression, chronic pain, hypertension, peripheral vascular disease, arthritis.   * I certify that at the point of admission it is my clinical judgment that the patient will require inpatient hospital care spanning beyond 2 midnights from the point of admission due to  high intensity of service, high risk for further deterioration and high frequency of surveillance required.Synetta Fail MD [facility]  How to contact the [facility] Attending or Consulting provider 7A - 7P or covering provider during after hours 7P -7A, for this patient?   Check the care team in [facility] and look for a) attending/consulting [facility] provider listed and b) the [facility] team listed Log into [email or web address] and use [place]'s universal password to access. If you do not have the password, please contact the hospital operator. Locate the [facility] provider you are looking for under [facility] and page to a number that you can be directly reached. If you still have difficulty reaching the provider, please page the [facility] (Director on Call) for the Hospitalists listed on amion for assistance.  11/30/2020, 7:02 PM

## 2020-11-30 NOTE — ED Provider Notes (Signed)
[facility] EMERGENCY DEPT Provider Note   CSN: [id number] Arrival date & time: 11/30/20  1102     History Chief Complaint  Patient presents with   Leg Swelling    Susan Pitts is a 87 y.o. female.  Pt presents to the ED today with leg swelling and sob.  Pt is unsure how long she has had sx.  She pointed out to her daughter on Thursday, 6/30.  She lives alone and takes care of her own meds.  She said she has been taking meds as directed. She has sob when she ambulates.  No f/c or cough.  She was on HCTZ, but she was changed to lasix when her doctor noticed her legs were slightly swollen.  She has never had swelling like she has now.  NO hx CHF.      Past Medical History:  Diagnosis Date   Arthritis    Constipation    Hypertension    Microscopic hematuria    Nocturia    Postmenopausal atrophic vaginitis     Patient Active Problem List   Diagnosis Date Noted   CHF (congestive heart failure) (HCC) 11/30/2020   Chronic pain 09/03/2020   Low back pain 09/03/2020   Pain due to onychomycosis of toenails of both feet 11/21/2018   Clavi 11/21/2018   Peripheral vascular disease (HCC) 12/20/2017   Hand arthritis 01/13/2017   Abnormal gait 12/12/2013   Major depression, single episode 12/12/2013   Fall 10/24/2013   Gallstones 12/27/2011   Microscopic hematuria    Obesity 03/03/2011   Essential hypertension 10/28/2010   Long term (current) use of opiate analgesic 06/17/2009   Anxiety disorder 06/17/2009   Constipation 06/17/2009   Osteoarthritis 06/17/2009    Past Surgical History:  Procedure Laterality Date   ABDOMINAL HYSTERECTOMY     JOINT REPLACEMENT     knee replacement   TONSILLECTOMY       OB History     Gravida      Para      Term      Preterm      AB      Living  3      SAB      IAB      Ectopic      Multiple      Live Births              Family History  Problem Relation Age of Onset   Heart attack Father      Social History   Tobacco Use   Smoking status: Never    Passive exposure: Never   Smokeless tobacco: Never  Vaping Use   Vaping Use: Never used  Substance Use Topics   Alcohol use: No   Drug use: No    Home Medications Prior to Admission medications   Medication Sig Start Date End Date Taking? Authorizing Provider  aspirin 81 MG tablet Take 81 mg by mouth daily.   Yes [provider]  benazepril (LOTENSIN) 40 MG tablet Take 40 mg by mouth daily.   Yes [provider]  Calcium Carbonate-Vitamin D (CALTRATE 600+D PO) Take 1 tablet by mouth daily.    Yes [provider]  Cholecalciferol (VITAMIN D-3 PO) Take 2,000 Units by mouth daily.    Yes [provider]  diazepam (VALIUM) 5 MG tablet Take 5 mg by mouth 2 (two) times daily.    Yes [provider]  furosemide (LASIX) 20 MG tablet Take 20 mg  by mouth.   Yes [provider]  HYDROcodone-acetaminophen (NORCO/VICODIN) 5-325 MG tablet Take 1 tablet by mouth every 6 (six) hours as needed for moderate pain.   Yes [provider]  metoprolol succinate (TOPROL-XL) 50 MG 24 hr tablet 1 tablet 01/04/20  Yes [provider]  Multiple Vitamin (MULTIVITAMIN) capsule Take 1 capsule by mouth daily.   Yes [provider]  raloxifene (EVISTA) 60 MG tablet Take 60 mg by mouth daily. 12/28/17  Yes [provider]  vitamin B-12 (CYANOCOBALAMIN) 500 MCG tablet Take 500 mcg by mouth daily.   Yes [provider]  hydrochlorothiazide (MICROZIDE) 12.5 MG capsule Take 12.5 mg by mouth every morning. 12/09/17   [provider]  Lidocaine HCl 4 % CREA apply to painful wrist and fingers daily as needed 09/23/16   [provider]  Liniments (BLUE-EMU SUPER STRENGTH) CREA apply to painful finger joints as needed 12/15/15   [provider]  loratadine (CLARITIN) 10 MG tablet Take 10 mg by mouth daily as needed for allergies.    [provider]  meclizine (ANTIVERT) 25 MG tablet Take one tablet by mouth three times every day as needed for dizziness 01/11/13   [provider]    Allergies    Cardura [doxazosin mesylate], Cardura [doxazosin], Codeine, Fosamax [alendronate], Inderal [propranolol], and Penicillins  Review of Systems   Review of Systems  Respiratory:  Positive for shortness of breath.   Cardiovascular:  Positive for leg swelling.  All other systems reviewed and are negative.  Physical Exam Updated Vital Signs BP (!) 172/78   Pulse 70   Temp 97.8 F (36.6 C) (Oral)   Resp 20   Ht 5\' 2"  (1.575 m)   Wt 79.7 kg   SpO2 95%   BMI 32.15 kg/m   Physical Exam Vitals and nursing note reviewed.  Constitutional:      Appearance: Normal appearance.  HENT:     Head: Normocephalic and atraumatic.     Right Ear: External ear normal.     Left Ear: External ear normal.     Mouth/Throat:     Mouth: Mucous membranes are moist.     Pharynx: Oropharynx is clear.  Eyes:     Extraocular Movements: Extraocular movements intact.     Conjunctiva/sclera: Conjunctivae normal.     Pupils: Pupils are equal, round, and reactive to light.  Cardiovascular:     Rate and Rhythm: Bradycardia present. Rhythm irregular.     Pulses: Normal pulses.     Heart sounds: Normal heart sounds.     Comments: Frequent PVCs on monitor.  Frequent couplets. Pulmonary:     Effort: Tachypnea present.     Breath sounds: Rhonchi present.  Abdominal:     General: Abdomen is flat. Bowel sounds are normal.  Musculoskeletal:     Cervical back: Normal range of motion and neck supple.     Right lower leg: Edema present.     Left lower leg: Edema present.     Comments: Weeping edema  Skin:    General: Skin is warm.     Capillary Refill: Capillary refill takes less than 2 seconds.     Comments: intertrigo  Neurological:     General: No focal deficit present.     Mental Status: She is alert and oriented to person, place, and  time.  Psychiatric:        Mood and Affect: Mood normal.        Behavior: Behavior  normal.        Thought Content: Thought content normal.        Judgment: Judgment normal.   ED Results / Procedures / Treatments   Labs (all labs ordered are listed, but only abnormal results are displayed) Labs Reviewed  BASIC METABOLIC PANEL - Abnormal; Notable for the following components:      Result Value   Glucose, Bld 108 (*)    BUN 31 (*)    Creatinine, Ser 1.09 (*)    GFR, Estimated 47 (*)    All other components within normal limits  BRAIN NATRIURETIC PEPTIDE - Abnormal; Notable for the following components:   B Natriuretic Peptide 1,310.9 (*)    All other components within normal limits  URINALYSIS, ROUTINE W REFLEX MICROSCOPIC - Abnormal; Notable for the following components:   Color, Urine COLORLESS (*)    All other components within normal limits  TROPONIN I (HIGH SENSITIVITY) - Abnormal; Notable for the following components:   Troponin I (High Sensitivity) 40 (*)    All other components within normal limits  RESP PANEL BY RT-PCR (FLU A&B, COVID) ARPGX2  CBC WITH DIFFERENTIAL/PLATELET  TROPONIN I (HIGH SENSITIVITY)    EKG EKG Interpretation  Date/Time:  Sunday November 30 2020 11:23:03 EDT Ventricular Rate:  74 PR Interval:  181 QRS Duration: 104 QT Interval:  384 QTC Calculation: 426 R Axis:   40 Text Interpretation: Sinus rhythm Paired ventricular premature complexes Confirmed by Jacalyn LefevreHaviland, Kenzleigh Sedam [phone]([id number]) on 11/30/2020 11:29:55 AM  Radiology DG Chest Port 1 View  Result Date: 11/30/2020 CLINICAL DATA:  87 year old with shortness of breath. EXAM: PORTABLE CHEST 1 VIEW COMPARISON:  None. FINDINGS: Single view of the chest demonstrates prominent lung and central vascular structures. Low lung volumes. Heart size is within normal limits. Negative for pneumothorax. Atherosclerotic calcifications at the aortic arch. No acute bone abnormality. IMPRESSION: Slightly prominent lung and  vascular markings. Findings could represent chronic changes versus mild edema. No focal airspace disease or lung consolidation. Electronically Signed   By: Richarda OverlieAdam  Henn M.D.   On: 11/30/2020 12:12    Procedures Procedures   Medications Ordered in ED Medications  furosemide (LASIX) injection 20 mg (20 mg Intravenous Given 11/30/20 1135)    ED Course  I have reviewed the triage vital signs and the nursing notes.  Pertinent labs & imaging results that were available during my care of the patient were reviewed by me and considered in my medical decision making (see chart for details).    MDM Rules/Calculators/A&P                         Pt has new onset CHF with a mild bump in troponin.  Pt is given 20 mg of lasix IV.  She had good diuresis, but is still very sob.  She can only walk a few steps without having to stop and rest.  Covid is negative.  She is d/w Dr. Chipper HerbZhang ([facility]) who will admit.  CRITICAL CARE Performed by: Jacalyn LefevreJulie Mairany Bruno   Total critical care time: 30 minutes  Critical care time was exclusive of separately billable procedures and treating other patients.  Critical care was necessary to treat or prevent imminent or life-threatening deterioration.  Critical care was time spent personally by me on the following activities: development of treatment plan with patient and/or surrogate as well as nursing, discussions with consultants, evaluation of patient's response to treatment, examination of patient, obtaining history from patient or surrogate, ordering  and performing treatments and interventions, ordering and review of laboratory studies, ordering and review of radiographic studies, pulse oximetry and re-evaluation of patient's condition.    Final Clinical Impression(s) / ED Diagnoses Final diagnoses:  Acute congestive heart failure, unspecified heart failure type (HCC)  Peripheral edema    Rx / DC Orders ED Discharge Orders     None        Jacalyn Lefevre,  MD 11/30/20 1442

## 2020-11-30 NOTE — ED Notes (Signed)
Attempted to stand with patient to ambulate. Patient becomes extremely short of breath with movement in the bed. Patient says her legs feel heavy, her shoes don't fit well for walking and she feels anxious about falling. Patient's Oxygen saturation dropped to 91% with maneuvering to stand from bed. Patient reports she lives by herself and is so anxious because she does not cook and is having trouble getting around. Patient is anxious about the weeping continuing and reports she cannot walk due to feeling short of breath at this time. Patient reports she has not been able to walk at home well even with her cane, has not been able to drive and has had trouble completing her ADLS. Patient has skin irritation under her abdomen and at her pelvis region from where she reports she has been taking sink baths due to being unable to shower from the length of standing making her Shob.

## 2020-11-30 NOTE — ED Notes (Signed)
[facility] present for transport.  

## 2020-12-01 ENCOUNTER — Inpatient Hospital Stay (HOSPITAL_COMMUNITY): Payer: Medicare PPO

## 2020-12-01 DIAGNOSIS — R0602 Shortness of breath: Secondary | ICD-10-CM

## 2020-12-01 LAB — CBC
HCT: 38.3 % (ref 36.0–46.0)
Hemoglobin: 12.7 g/dL (ref 12.0–15.0)
MCH: 31.8 pg (ref 26.0–34.0)
MCHC: 33.2 g/dL (ref 30.0–36.0)
MCV: 96 fL (ref 80.0–100.0)
Platelets: 265 10*3/uL (ref 150–400)
RBC: 3.99 MIL/uL (ref 3.87–5.11)
RDW: 14.6 % (ref 11.5–15.5)
WBC: 9.6 10*3/uL (ref 4.0–10.5)
nRBC: 0 % (ref 0.0–0.2)

## 2020-12-01 LAB — BASIC METABOLIC PANEL
Anion gap: 7 (ref 5–15)
BUN: 29 mg/dL — ABNORMAL HIGH (ref 8–23)
CO2: 26 mmol/L (ref 22–32)
Calcium: 8.9 mg/dL (ref 8.9–10.3)
Chloride: 104 mmol/L (ref 98–111)
Creatinine, Ser: 1.25 mg/dL — ABNORMAL HIGH (ref 0.44–1.00)
GFR, Estimated: 40 mL/min — ABNORMAL LOW (ref >60)
Glucose, Bld: 105 mg/dL — ABNORMAL HIGH (ref 70–99)
Potassium: 3.9 mmol/L (ref 3.5–5.1)
Sodium: 137 mmol/L (ref 135–145)

## 2020-12-01 LAB — ECHOCARDIOGRAM COMPLETE
AR max vel: 1.79 cm2
AV Area VTI: 1.62 cm2
AV Area mean vel: 1.78 cm2
AV Mean grad: 8 mmHg
AV Peak grad: 15.2 mmHg
Ao pk vel: 1.95 m/s
Area-P 1/2: 3.31 cm2
Height: 62 in
P 1/2 time: 718 msec
S' Lateral: 3.1 cm
Weight: 2469.15 oz

## 2020-12-01 MED ORDER — FUROSEMIDE 10 MG/ML IJ SOLN
40.0000 mg | Freq: Once | INTRAMUSCULAR | Status: AC
  Administered 2020-12-01: 40 mg via INTRAVENOUS
  Filled 2020-12-01: qty 4

## 2020-12-01 MED ORDER — ENOXAPARIN SODIUM 40 MG/0.4ML IJ SOSY
40.0000 mg | PREFILLED_SYRINGE | INTRAMUSCULAR | Status: DC
  Administered 2020-12-01: 40 mg via SUBCUTANEOUS

## 2020-12-01 MED ORDER — ENOXAPARIN SODIUM 30 MG/0.3ML IJ SOSY
30.0000 mg | PREFILLED_SYRINGE | INTRAMUSCULAR | Status: DC
  Administered 2020-12-01 – 2020-12-04 (×4): 30 mg via SUBCUTANEOUS
  Filled 2020-12-01 (×4): qty 0.3

## 2020-12-01 NOTE — Progress Notes (Signed)
Patient and daughter would like to see the MD about plans for home care.

## 2020-12-01 NOTE — Evaluation (Signed)
Physical Therapy Evaluation Patient Details Name: Susan Pitts MRN: [id number] DOB: August 03, 1923 Today's Date: 12/01/2020   History of Present Illness  Pt is a 87 y/o female adm 7/3 with worsening edema and SOB due to acute on chronic diastolic heart failure. PMH - chf, pvd, HTN, anxiety  Clinical Impression  Pt admitted with/for acute of chronic diastolic CHF  Pt is still swollen with mild SOB and needing minimal assist overall for basic mobility and gait.  Pt currently limited functionally due to the problems listed below.  (see problems list.)  Pt will benefit from PT to maximize function and safety to be able to get home safely with available assist.     Follow Up Recommendations Home health PT;Supervision/Assistance - 24 hour (if no initial 24 hour assist, consider ST SNF for rehab)    Equipment Recommendations  None recommended by PT    Recommendations for Other Services       Precautions / Restrictions Precautions Precautions: Fall      Mobility  Bed Mobility Overal bed mobility: Needs Assistance Bed Mobility: Supine to Sit;Sit to Supine     Supine to sit: Min assist Sit to supine: Min assist   General bed mobility comments: cues for better sequencing, pt needed truncal assist up and forward until she could use UE's to push toward EOB    Transfers Overall transfer level: Needs assistance Equipment used: None;Rolling walker (2 wheeled) Transfers: Sit to/from Stand Sit to Stand: Min assist         General transfer comment: pt with difficulty bending her knees back far enough to easily get up without feet sliding, thus needing help coming forward.  Ambulation/Gait Ambulation/Gait assistance: Min guard Gait Distance (Feet): 140 Feet (then 60 feet after shout rest.) Assistive device: Rolling walker (2 wheeled) Gait Pattern/deviations: Step-through pattern   Gait velocity interpretation: <1.8 ft/sec, indicate of risk for recurrent falls General Gait Details: cues  for posture and better proximity in the RW.  Pt flexed forward walking behind the RW, short, mildly unsteady steps.  Pt did become dyspneic with sats dropping to 89%,  Rose to 93% on 2L [place]  Stairs            Wheelchair Mobility    Modified Rankin (Stroke Patients Only)       Balance Overall balance assessment: Needs assistance Sitting-balance support: No upper extremity supported;Feet supported Sitting balance-Leahy Scale: Fair     Standing balance support: Single extremity supported;Bilateral upper extremity supported;During functional activity Standing balance-Leahy Scale: Poor Standing balance comment: reliant on the AD today.                             Pertinent Vitals/Pain Pain Assessment: Faces Pain Intervention(s): Monitored during session    Home Living Family/patient expects to be discharged to:: Private residence Living Arrangements: Alone Available Help at Discharge: Family;Available PRN/intermittently Type of Home: House Home Access: Stairs to enter   Entrance Stairs-Number of Steps: 1 Home Layout: One level Home Equipment: Walker - 2 wheels;Cane - single point      Prior Function Level of Independence: Independent with assistive device(s)               Hand Dominance        Extremity/Trunk Assessment   Upper Extremity Assessment Upper Extremity Assessment: Overall WFL for tasks assessed    Lower Extremity Assessment Lower Extremity Assessment: Overall WFL for tasks assessed (General weakness proximally, stiff,  swollen legs bil)       Communication   Communication: No difficulties  Cognition Arousal/Alertness: Awake/alert Behavior During Therapy: WFL for tasks assessed/performed Overall Cognitive Status: Within Functional Limits for tasks assessed                                        General Comments      Exercises     Assessment/Plan    PT Assessment Patient needs continued PT services  PT  Problem List Decreased strength;Decreased activity tolerance;Decreased mobility;Decreased knowledge of use of DME;Cardiopulmonary status limiting activity       PT Treatment Interventions DME instruction;Gait training;Functional mobility training;Therapeutic activities;Patient/family education    PT Goals (Current goals can be found in the Care Plan section)  Acute Rehab PT Goals Patient Stated Goal: I want to get home. PT Goal Formulation: With patient Time For Goal Achievement: 12/08/20 Potential to Achieve Goals: Good    Frequency Min 3X/week   Barriers to discharge        Co-evaluation               AM-PAC PT "6 Clicks" Mobility  Outcome Measure Help needed turning from your back to your side while in a flat bed without using bedrails?: None Help needed moving from lying on your back to sitting on the side of a flat bed without using bedrails?: A Little Help needed moving to and from a bed to a chair (including a wheelchair)?: A Little Help needed standing up from a chair using your arms (e.g., wheelchair or bedside chair)?: A Little Help needed to walk in hospital room?: A Little Help needed climbing 3-5 steps with a railing? : A Little 6 Click Score: 19    End of Session   Activity Tolerance: Patient tolerated treatment well Patient left: in bed;with call bell/phone within reach;with bed alarm set;with family/visitor present Nurse Communication: Mobility status PT Visit Diagnosis: Unsteadiness on feet (R26.81);Muscle weakness (generalized) (M62.81)    Time: 2671-2458 PT Time Calculation (min) (ACUTE ONLY): 51 min   Charges:   PT Evaluation $PT Eval Moderate Complexity: 1 Mod PT Treatments $Gait Training: 8-22 mins $Therapeutic Activity: 8-22 mins        12/01/2020  Jacinto Halim., PT Acute Rehabilitation Services [phone]  (pager) [phone]  (office)  Eliseo Gum Shahida Schnackenberg 12/01/2020, 5:00 PM

## 2020-12-01 NOTE — Progress Notes (Signed)
PROGRESS NOTE    Susan Pitts  XHB:[id number] DOB: 1923/06/09 DOA: 11/30/2020 PCP: Jarome Matin, MD    Brief Narrative:  [name] was admitted to the hospital with the working diagnosis of acute on chronic diastolic heart failure decompensation.   87 yo female with the past medical history of hypertension, peripheral vascular disease, arthritis, anxiety and depression who presented with dyspnea and lower extremity edema.  Reported 3 weeks of worsening dyspnea, associated lower extremity edema but no chest pain.  On her initial physical examination blood pressure 178/71, heart rate 61, respiratory rate 23, temperature 97.6, oxygen saturation 94%.  She had dry mucous membranes, her lungs were clear to auscultation bilaterally, heart S1-S2, present, rhythmic, positive lower extremity edema.  Sodium 141, potassium 4.5, chloride 105, bicarb 25, glucose 108, BUN 31, creatinine 1.09, BNP 1310, high sensitive troponin 40-47-61, white count 8.3, hemoglobin 12.3, hematocrit 37.9, platelets 272. SARS COVID-19 negative.  Urinalysis specific gravity 1.009.  Chest radiograph with increased lung markings bilaterally, film is left-sided rotated with prominent hilar vasculature on the right.  EKG 74 bpm, normal axis, normal intervals, sinus rhythm with PVCs, no significant ST segment or T wave changes.  Assessment & Plan:   Principal Problem:   Acute CHF (congestive heart failure) (HCC) Active Problems:   Anxiety disorder   Chronic pain   Essential hypertension   Peripheral vascular disease (HCC)   Acute on chronic diastolic heart failure. Her symptoms have improved but not yet back to baseline, continue to have lower extremity edema and dyspnea on exertion.   Urine output 2,900 cc over last 24 hrs, warm extremities, blood pressure systolic 130 mmHg.  Serum cr is 1,25 from 1,09, with K at 3,9 and bicarbonate at 26, no criteria for AKI.   Plan to continue with IV furosemide to target negative  fluid balance. Patient had one dose of 20 mg this am, will ad 40 more now and follow response before further dosing.  Follow up on echocardiogram.  Consult PT and OT, out of bed to chair tid with meals.   2.HTN. Blood pressure control with benazepril and metoprolol.   3. PVD. Continue with raloxifene.   4. Anxiety. Continue with as needed diazepam.   5. DJD. Continue pain control with acetaminophen and hydrocodone. Follow with orthopedics as outpatient.   Patient continue to be at high risk for worsening heart failure   Status is: Inpatient  Remains inpatient appropriate because:IV treatments appropriate due to intensity of illness or inability to take PO  Dispo: The patient is from: Home              Anticipated d/c is to: Home              Patient currently is not medically stable to d/c.   Difficult to place patient No   DVT prophylaxis: Enoxaparin   Code Status:   full  Family Communication:  I spoke with patient's daughter at the bedside, we talked in detail about patient's condition, plan of care and prognosis and all questions were addressed.   Subjective: Patient with no nausea or vomiting, no chest pain, dyspnea and lower extremity edema improved but not yet back to baseline,   Objective: Vitals:   12/01/20 0458 12/01/20 0604 12/01/20 0725 12/01/20 1108  BP: (!) 189/57 (!) 188/68 (!) 176/95 (!) 130/56  Pulse: 63 62 65 61  Resp: 18  20 18   Temp: 97.9 F (36.6 C)  97.8 F (36.6 C) 97.6 F (36.4  C)  TempSrc: Oral  Oral Oral  SpO2: 94%  95% 95%  Weight: 70 kg     Height:        Intake/Output Summary (Last 24 hours) at 12/01/2020 1221 Last data filed at 12/01/2020 1108 Gross per 24 hour  Intake 460 ml  Output 3250 ml  Net -2790 ml   Filed Weights   11/30/20 1111 11/30/20 1755 12/01/20 0458  Weight: 79.7 kg 70.6 kg 70 kg    Examination:   General: Not in pain or dyspnea, deconditioned  Neurology: Awake and alert, non focal  E ENT: no pallor, no icterus,  oral mucosa moist Cardiovascular: No JVD. S1-S2 present, rhythmic, no gallops, rubs, or murmurs. ++ pitting up to the thighs lower extremity edema. Pulmonary: positive breath sounds bilaterally, with no wheezing, or rhonchi, positive bibasilar rales. Gastrointestinal. Abdomen soft and non tender Skin. No rashes Musculoskeletal: bilateral knee hypertrophy, more right than left.      Data Reviewed: I have personally reviewed following labs and imaging studies  CBC: Recent Labs  Lab 11/30/20 1130 12/01/20 0235  WBC 8.3 9.6  NEUTROABS 6.5  --   HGB 12.3 12.7  HCT 37.9 38.3  MCV 96.2 96.0  PLT 272 265   Basic Metabolic Panel: Recent Labs  Lab 11/30/20 1130 11/30/20 2006 12/01/20 0235  NA 141  --  137  K 4.5  --  3.9  CL 105  --  104  CO2 27  --  26  GLUCOSE 108*  --  105*  BUN 31*  --  29*  CREATININE 1.09*  --  1.25*  CALCIUM 9.3  --  8.9  MG  --  1.7  --    GFR: Estimated Creatinine Clearance: 24.7 mL/min (A) (by C-G formula based on SCr of 1.25 mg/dL (H)). Liver Function Tests: No results for input(s): AST, ALT, ALKPHOS, BILITOT, PROT, ALBUMIN in the last 168 hours. No results for input(s): LIPASE, AMYLASE in the last 168 hours. No results for input(s): AMMONIA in the last 168 hours. Coagulation Profile: No results for input(s): INR, PROTIME in the last 168 hours. Cardiac Enzymes: No results for input(s): CKTOTAL, CKMB, CKMBINDEX, TROPONINI in the last 168 hours. BNP (last 3 results) No results for input(s): PROBNP in the last 8760 hours. HbA1C: No results for input(s): HGBA1C in the last 72 hours. CBG: No results for input(s): GLUCAP in the last 168 hours. Lipid Profile: No results for input(s): CHOL, HDL, LDLCALC, TRIG, CHOLHDL, LDLDIRECT in the last 72 hours. Thyroid Function Tests: No results for input(s): TSH, T4TOTAL, FREET4, T3FREE, THYROIDAB in the last 72 hours. Anemia Panel: No results for input(s): VITAMINB12, FOLATE, FERRITIN, TIBC, IRON,  RETICCTPCT in the last 72 hours.    Radiology Studies: I have reviewed all of the imaging during this hospital visit personally     Scheduled Meds:  aspirin EC  81 mg Oral Daily   benazepril  40 mg Oral Daily   enoxaparin (LOVENOX) injection  30 mg Subcutaneous Q24H   furosemide  20 mg Intravenous BID   metoprolol succinate  50 mg Oral Daily   multivitamin with minerals  1 tablet Oral Daily   raloxifene  60 mg Oral Daily   sodium chloride flush  3 mL Intravenous Q12H   Continuous Infusions:   LOS: 1 day        Clark Clowdus Annett Gula, MD

## 2020-12-01 NOTE — Progress Notes (Signed)
  Echocardiogram 2D Echocardiogram has been performed.  Susan Pitts F 12/01/2020, 3:27 PM

## 2020-12-01 NOTE — Plan of Care (Signed)
  Problem: Elimination: Goal: Will not experience complications related to urinary retention Outcome: Completed/Met

## 2020-12-02 LAB — BASIC METABOLIC PANEL
Anion gap: 9 (ref 5–15)
BUN: 30 mg/dL — ABNORMAL HIGH (ref 8–23)
CO2: 29 mmol/L (ref 22–32)
Calcium: 8.9 mg/dL (ref 8.9–10.3)
Chloride: 101 mmol/L (ref 98–111)
Creatinine, Ser: 1.22 mg/dL — ABNORMAL HIGH (ref 0.44–1.00)
GFR, Estimated: 41 mL/min — ABNORMAL LOW (ref >60)
Glucose, Bld: 97 mg/dL (ref 70–99)
Potassium: 3.5 mmol/L (ref 3.5–5.1)
Sodium: 139 mmol/L (ref 135–145)

## 2020-12-02 MED ORDER — FUROSEMIDE 40 MG PO TABS
40.0000 mg | ORAL_TABLET | Freq: Every day | ORAL | Status: DC
  Administered 2020-12-03 – 2020-12-05 (×3): 40 mg via ORAL
  Filled 2020-12-02 (×3): qty 1

## 2020-12-02 NOTE — NC FL2 (Signed)
[place] MEDICAID FL2 LEVEL OF CARE SCREENING TOOL     IDENTIFICATION  Patient Name: Susan Pitts Birthdate: 1923-07-19 Sex: female Admission Date (Current Location): 11/30/2020  [facility] and [place] Number:  Producer, television/film/video and Address:  [facility], [address] [place], [place] [address]      Provider Number: [id number]  Attending Physician Name and Address:  Coralie Keens,*  Relative Name and Phone Number:  Malachy Moan (daughter)  cell [phone], phone [phone]    Current Level of Care: Hospital Recommended Level of Care: Skilled Nursing Facility Prior Approval Number:    Date Approved/Denied:   PASRR Number: [id number]  Discharge Plan: SNF    Current Diagnoses: Patient Active Problem List   Diagnosis Date Noted   Acute CHF (congestive heart failure) (HCC) 11/30/2020   Chronic pain 09/03/2020   Low back pain 09/03/2020   Pain due to onychomycosis of toenails of both feet 11/21/2018   Clavi 11/21/2018   Peripheral vascular disease (HCC) 12/20/2017   Hand arthritis 01/13/2017   Abnormal gait 12/12/2013   Major depression, single episode 12/12/2013   Fall 10/24/2013   Gallstones 12/27/2011   Microscopic hematuria    Obesity 03/03/2011   Essential hypertension 10/28/2010   Long term (current) use of opiate analgesic 06/17/2009   Anxiety disorder 06/17/2009   Constipation 06/17/2009   Osteoarthritis 06/17/2009    Orientation RESPIRATION BLADDER Height & Weight     Self, Time, Situation, Place  Normal External catheter Weight: 74.4 kg Height:  5\' 2"  (157.5 cm)  BEHAVIORAL SYMPTOMS/MOOD NEUROLOGICAL BOWEL NUTRITION STATUS      Continent Diet (See DC summary)  AMBULATORY STATUS COMMUNICATION OF NEEDS Skin   Limited Assist Verbally Normal                       Personal Care Assistance Level of Assistance  Bathing, Feeding, Dressing Bathing Assistance: Limited assistance Feeding assistance: Limited  assistance (set up) Dressing Assistance: Limited assistance     Functional Limitations Info  Sight, Hearing, Speech Sight Info: Adequate Hearing Info: Adequate Speech Info: Adequate    SPECIAL CARE FACTORS FREQUENCY  PT (By licensed PT), OT (By licensed OT)     PT Frequency: 5x/week OT Frequency: 5x/week            Contractures Contractures Info: Not present    Additional Factors Info  Code Status, Allergies Code Status Info: Full Allergies Info: Cardura (Doxazosin Mesylate), Cardura (Doxazosin), Codeine, Fosamax (Alendronate), Inderal (Propranolol), Penicillins           Current Medications (12/02/2020):  This is the current hospital active medication list Current Facility-Administered Medications  Medication Dose Route Frequency Provider Last Rate Last Admin   acetaminophen (TYLENOL) tablet 650 mg  650 mg Oral Q6H PRN 02/02/2021, MD       Or   acetaminophen (TYLENOL) suppository 650 mg  650 mg Rectal Q6H PRN Synetta Fail, MD       aspirin EC tablet 81 mg  81 mg Oral Daily Synetta Fail, MD   81 mg at 12/02/20 0858   benazepril (LOTENSIN) tablet 40 mg  40 mg Oral Daily 02/02/21 N, DO   40 mg at 12/02/20 0858   diazepam (VALIUM) tablet 5 mg  5 mg Oral BID PRN 02/02/21, MD   5 mg at 12/01/20 2011   enoxaparin (LOVENOX) injection 30 mg  30 mg Subcutaneous  Q24H Vicente Serene, RPH   30 mg at 12/01/20 1639   [START ON 12/03/2020] furosemide (LASIX) tablet 40 mg  40 mg Oral Daily Arrien, York Ram, MD       HYDROcodone-acetaminophen (NORCO/VICODIN) 5-325 MG per tablet 1 tablet  1 tablet Oral Q6H PRN Synetta Fail, MD   1 tablet at 12/01/20 1638   meclizine (ANTIVERT) tablet 12.5 mg  12.5 mg Oral BID PRN Synetta Fail, MD       metoprolol succinate (TOPROL-XL) 24 hr tablet 50 mg  50 mg Oral Daily Dow Adolph N, DO   50 mg at 12/02/20 1610   multivitamin with minerals tablet 1 tablet  1 tablet Oral Daily Synetta Fail, MD    1 tablet at 12/02/20 0858   polyethylene glycol (MIRALAX / GLYCOLAX) packet 17 g  17 g Oral Daily PRN Synetta Fail, MD   17 g at 12/01/20 1639   raloxifene (EVISTA) tablet 60 mg  60 mg Oral Daily Synetta Fail, MD   60 mg at 12/02/20 0858   sodium chloride flush (NS) 0.9 % injection 3 mL  3 mL Intravenous Q12H Synetta Fail, MD   3 mL at 12/02/20 9604     Discharge Medications: Please see discharge summary for a list of discharge medications.  Relevant Imaging Results:  Relevant Lab Results:   Additional Information [address]  Leone Haven, RN

## 2020-12-02 NOTE — Progress Notes (Signed)
PROGRESS NOTE    Susan Pitts  CVE:[id number] DOB: 1923-11-25 DOA: 11/30/2020 PCP: Jarome Matin, MD    Brief Narrative:  [name] was admitted to the hospital with the working diagnosis of acute on chronic diastolic heart failure decompensation.   87 yo female with the past medical history of hypertension, peripheral vascular disease, arthritis, anxiety and depression who presented with dyspnea and lower extremity edema.  Reported 3 weeks of worsening dyspnea, associated lower extremity edema but no chest pain.  On her initial physical examination blood pressure 178/71, heart rate 61, respiratory rate 23, temperature 97.6, oxygen saturation 94%.  She had dry mucous membranes, her lungs were clear to auscultation bilaterally, heart S1-S2, present, rhythmic, positive lower extremity edema.   Sodium 141, potassium 4.5, chloride 105, bicarb 25, glucose 108, BUN 31, creatinine 1.09, BNP 1310, high sensitive troponin 40-47-61, white count 8.3, hemoglobin 12.3, hematocrit 37.9, platelets 272. SARS COVID-19 negative.   Urinalysis specific gravity 1.009.   Chest radiograph with increased lung markings bilaterally, film is left-sided rotated with prominent hilar vasculature on the right.   EKG 74 bpm, normal axis, normal intervals, sinus rhythm with PVCs, no significant ST segment or T wave changes.   Patient placed on aggressive diuresis with improvement of her symptoms. Continue to be very weak and deconditioned, plan to transfer to SNF.    Assessment & Plan:   Principal Problem:   Acute CHF (congestive heart failure) (HCC) Active Problems:   Anxiety disorder   Chronic pain   Essential hypertension   Peripheral vascular disease (HCC)   Acute on chronic diastolic heart failure, chronic pulmonary hypertension.  Patient's volume status continue to improve with decreased lower extremity edema and improvement in dyspnea.   Her urine output over last 24 hrs is 1,675 cc with a net fluid  loss since admission -3,272.    Echocardiogram with preserved LV systolic function EF 50 to 55% with mild concentric LVH, RV systolic function preserved. Left atrial size with severe dilatation. Severe elevated pulmonary artery systolic pressure , RSVP 65,7 mmHg. Mild to moderate TR.   Plan to transition to oral furosemide 40 mg daily.  Continue blood pressure control.  Follow up on renal function in am.    2.HTN. Blood pressure 130 to 160 mmHg, Continue with benazepril and metoprolol.  3. CKD stage 3b. Renal function has been stable with serum cr at 1,2 with K at 3,5 and serum bicarbonate at 29.  Change to oral furosemide 40 mg daily    4. PVD. On raloxifene.   5. Anxiety. PRN diazepam.   6. DJD. Continue pain control with acetaminophen and hydrocodone. Follow with orthopedics as outpatient. Limited mobility.  Plan to transfer to SNF    Status is: Inpatient  Remains inpatient appropriate because:Inpatient level of care appropriate due to severity of illness  Dispo: The patient is from: Home              Anticipated d/c is to: SNF              Patient currently is not medically stable to d/c.   Difficult to place patient No   DVT prophylaxis: Enoxaparin   Code Status:   Full  Family Communication:   I spoke over the phone with the patient's daughter about patient's  condition, plan of care, prognosis and all questions were addressed.    Subjective: Patient reports improvement in her dyspnea and lower extremity edema but not yet back to baseline, no nausea  or vomiting.   Objective: Vitals:   12/01/20 1921 12/02/20 0256 12/02/20 0813 12/02/20 1134  BP: (!) 143/77 (!) 163/56 (!) 150/120 (!) 136/53  Pulse: (!) 57 66 67 (!) 57  Resp: 18 16    Temp: 97.8 F (36.6 C) 97.7 F (36.5 C)  (!) 97.5 F (36.4 C)  TempSrc: Oral Oral  Oral  SpO2: 99% 92% 92% 96%  Weight:  74.4 kg    Height:        Intake/Output Summary (Last 24 hours) at 12/02/2020 1308 Last data filed at  12/02/2020 0617 Gross per 24 hour  Intake 603 ml  Output 1325 ml  Net -722 ml   Filed Weights   11/30/20 1755 12/01/20 0458 12/02/20 0256  Weight: 70.6 kg 70 kg 74.4 kg    Examination:   General: Not in pain or dyspnea, deconditioned  Neurology: Awake and alert, non focal  E ENT: no pallor, no icterus, oral mucosa moist Cardiovascular: No JVD. S1-S2 present, rhythmic, no gallops, rubs, or murmurs. + pitting bilateral lower extremity edema. Pulmonary: positive breath sounds bilaterally, adequate air movement, no wheezing, rhonchi or rales. Gastrointestinal. Abdomen soft and non tender Skin. No rashes Musculoskeletal: bilateral hypertrophic knees.      Data Reviewed: I have personally reviewed following labs and imaging studies  CBC: Recent Labs  Lab 11/30/20 1130 12/01/20 0235  WBC 8.3 9.6  NEUTROABS 6.5  --   HGB 12.3 12.7  HCT 37.9 38.3  MCV 96.2 96.0  PLT 272 265   Basic Metabolic Panel: Recent Labs  Lab 11/30/20 1130 11/30/20 2006 12/01/20 0235 12/02/20 0245  NA 141  --  137 139  K 4.5  --  3.9 3.5  CL 105  --  104 101  CO2 27  --  26 29  GLUCOSE 108*  --  105* 97  BUN 31*  --  29* 30*  CREATININE 1.09*  --  1.25* 1.22*  CALCIUM 9.3  --  8.9 8.9  MG  --  1.7  --   --    GFR: Estimated Creatinine Clearance: 26 mL/min (A) (by C-G formula based on SCr of 1.22 mg/dL (H)). Liver Function Tests: No results for input(s): AST, ALT, ALKPHOS, BILITOT, PROT, ALBUMIN in the last 168 hours. No results for input(s): LIPASE, AMYLASE in the last 168 hours. No results for input(s): AMMONIA in the last 168 hours. Coagulation Profile: No results for input(s): INR, PROTIME in the last 168 hours. Cardiac Enzymes: No results for input(s): CKTOTAL, CKMB, CKMBINDEX, TROPONINI in the last 168 hours. BNP (last 3 results) No results for input(s): PROBNP in the last 8760 hours. HbA1C: No results for input(s): HGBA1C in the last 72 hours. CBG: No results for input(s):  GLUCAP in the last 168 hours. Lipid Profile: No results for input(s): CHOL, HDL, LDLCALC, TRIG, CHOLHDL, LDLDIRECT in the last 72 hours. Thyroid Function Tests: No results for input(s): TSH, T4TOTAL, FREET4, T3FREE, THYROIDAB in the last 72 hours. Anemia Panel: No results for input(s): VITAMINB12, FOLATE, FERRITIN, TIBC, IRON, RETICCTPCT in the last 72 hours.    Radiology Studies: I have reviewed all of the imaging during this hospital visit personally     Scheduled Meds:  aspirin EC  81 mg Oral Daily   benazepril  40 mg Oral Daily   enoxaparin (LOVENOX) injection  30 mg Subcutaneous Q24H   metoprolol succinate  50 mg Oral Daily   multivitamin with minerals  1 tablet Oral Daily   raloxifene  60  mg Oral Daily   sodium chloride flush  3 mL Intravenous Q12H   Continuous Infusions:   LOS: 2 days        Autym Siess Annett Gula, MD

## 2020-12-02 NOTE — TOC Initial Note (Signed)
Transition of Care [facility]) - Initial/Assessment Note    Patient Details  Name: Susan Pitts MRN: [id number] Date of Birth: 1924-05-07  Transition of Care [facility]) CM/SW Contact:    Leone Haven, RN Phone Number: 12/02/2020, 2:38 PM  Clinical Narrative:                 NCM spoke with patient at bedside.  She is agreeable to going to a SNF at dc and states it is ok to fax her information out to see what bed offers she get in [facility].NCM sent information out thru the Hub.  Expected Discharge Plan: Skilled Nursing Facility Barriers to Discharge: Continued Medical Work up   Patient Goals and CMS Choice Patient states their goals for this hospitalization and ongoing recovery are:: SNF CMS Medicare.gov Compare Post Acute Care list provided to:: Patient Choice offered to / list presented to : Patient  Expected Discharge Plan and Services Expected Discharge Plan: Skilled Nursing Facility   Discharge Planning Services: CM Consult Post Acute Care Choice: Skilled Nursing Facility Living arrangements for the past 2 months: Single Family Home                   DME Agency: NA       HH Arranged: NA          Prior Living Arrangements/Services Living arrangements for the past 2 months: Single Family Home Lives with:: Self Patient language and need for interpreter reviewed:: Yes Do you feel safe going back to the place where you live?: Yes      Need for Family Participation in Patient Care: No (Comment) Care giver support system in place?: No (comment)   Criminal Activity/Legal Involvement Pertinent to Current Situation/Hospitalization: No - Comment as needed  Activities of Daily Living Home Assistive Devices/Equipment: Cane (specify quad or straight), Walker (specify type) ADL Screening (condition at time of admission) Patient's cognitive ability adequate to safely complete daily activities?: Yes Is the patient deaf or have difficulty hearing?: No Does the patient  have difficulty seeing, even when wearing glasses/contacts?: No Does the patient have difficulty concentrating, remembering, or making decisions?: No Patient able to express need for assistance with ADLs?: Yes Does the patient have difficulty dressing or bathing?: Yes Independently performs ADLs?: Yes (appropriate for developmental age) Does the patient have difficulty walking or climbing stairs?: Yes Weakness of Legs: Both Weakness of Arms/Hands: Both  Permission Sought/Granted                  Emotional Assessment Appearance:: Appears stated age Attitude/Demeanor/Rapport: Engaged Affect (typically observed): Appropriate Orientation: : Oriented to Situation, Oriented to  Time, Oriented to Place, Oriented to Self Alcohol / Substance Use: Not Applicable Psych Involvement: No (comment)  Admission diagnosis:  CHF (congestive heart failure) (HCC) [I50.9] Peripheral edema [R60.9] Acute congestive heart failure, unspecified heart failure type (HCC) [I50.9] Patient Active Problem List   Diagnosis Date Noted   Acute CHF (congestive heart failure) (HCC) 11/30/2020   Chronic pain 09/03/2020   Low back pain 09/03/2020   Pain due to onychomycosis of toenails of both feet 11/21/2018   Clavi 11/21/2018   Peripheral vascular disease (HCC) 12/20/2017   Hand arthritis 01/13/2017   Abnormal gait 12/12/2013   Major depression, single episode 12/12/2013   Fall 10/24/2013   Gallstones 12/27/2011   Microscopic hematuria    Obesity 03/03/2011   Essential hypertension 10/28/2010   Long term (current) use of opiate analgesic 06/17/2009   Anxiety disorder 06/17/2009  Constipation 06/17/2009   Osteoarthritis 06/17/2009   PCP:  Jarome Matin, MD Pharmacy:   [facility] [address] [place] - [address] [place] [address] Phone: [phone] Fax: [phone]     Social Determinants of Health (SDOH) Interventions     Readmission Risk Interventions No flowsheet data found.

## 2020-12-02 NOTE — Evaluation (Signed)
Occupational Therapy Evaluation Patient Details Name: Susan Pitts MRN: [id number] DOB: 09-20-1923 Today's Date: 12/02/2020    History of Present Illness Pt is a 87 y/o femaleadm 7/3 with worsening edema and SOB due to acute on chronic diastolic heart failure. PMH - chf, pvd, HTN, anxiety   Clinical Impression   PTA, pt lives alone and reports Modified Independence with ADLs, basic meals and mobility using RW in the home. Pt reports frequent falls at home and increasing difficulty with ADL completion. Pt presents now with deficits in cognition, standing balance, strength and endurance. Pt noted with increased confusion today, reports "having a spell" overnight. Frequent cues needed to redirect with pt able to demo Mod A for initial sit to stand progressing to Min A during session. Pt requires Min A for UB ADLs and Mod A for LB ADLs. Due to pt below functional baseline, increased risk for falls and decreased caregiver support availability - recommend SNF rehab prior to return home.     Follow Up Recommendations  SNF;Supervision/Assistance - 24 hour    Equipment Recommendations  3 in 1 bedside commode    Recommendations for Other Services       Precautions / Restrictions Precautions Precautions: Fall Precaution Comments: falls at home Restrictions Weight Bearing Restrictions: No      Mobility Bed Mobility Overal bed mobility: Needs Assistance Bed Mobility: Supine to Sit     Supine to sit: Mod assist;HOB elevated     General bed mobility comments: cues to use bedrail but pt still reaching for handheld assist. assist to also scoot hips forward    Transfers Overall transfer level: Needs assistance Equipment used: Rolling walker (2 wheeled) Transfers: Sit to/from [employer] Sit to Stand: Mod assist Stand pivot transfers: Min assist       General transfer comment: Initially Mod A for sit to stand from bedside, cues for hand placement with pt difficulty  bending knees. Progressed to Min A from [facility] pushing up from armrests though reaches out from RW to other surfaces during transfer    Balance Overall balance assessment: Needs assistance Sitting-balance support: No upper extremity supported;Feet supported Sitting balance-Leahy Scale: Fair     Standing balance support: Single extremity supported;Bilateral upper extremity supported;During functional activity Standing balance-Leahy Scale: Poor Standing balance comment: reliant on at least one UE support with static standing, BUE support for dynamic tasks                           ADL either performed or assessed with clinical judgement   ADL Overall ADL's : Needs assistance/impaired Eating/Feeding: Set up;Sitting   Grooming: Supervision/safety;Sitting   Upper Body Bathing: Minimal assistance;Sitting   Lower Body Bathing: Moderate assistance;Sit to/from stand   Upper Body Dressing : Minimal assistance;Sitting   Lower Body Dressing: Moderate assistance;Sit to/from stand   Toilet Transfer: Moderate assistance;Stand-pivot;BSC;RW Toilet Transfer Details (indicate cue type and reason): Mod A for initial power up and MIn A for pivot to [facility], cues needed for safety Toileting- Clothing Manipulation and Hygiene: Moderate assistance;Sit to/from stand Toileting - Clothing Manipulation Details (indicate cue type and reason): Assist for mgmt of underwear and thorough hygiene in standing       General ADL Comments: Pt with increased confusion, deficits in standing balance and endurance. pt reports frequent falls at home     Vision Baseline Vision/History: Wears glasses Wears Glasses: Reading only Patient Visual Report: No change from baseline Vision Assessment?: No apparent  visual deficits     Perception     Praxis      Pertinent Vitals/Pain Pain Assessment: Faces Faces Pain Scale: Hurts a little bit Pain Location: B feet Pain Descriptors / Indicators: Sore;Tightness Pain  Intervention(s): Monitored during session;Repositioned     Hand Dominance Right   Extremity/Trunk Assessment Upper Extremity Assessment Upper Extremity Assessment: Generalized weakness   Lower Extremity Assessment Lower Extremity Assessment: Defer to PT evaluation   Cervical / Trunk Assessment Cervical / Trunk Assessment: Kyphotic   Communication Communication Communication: No difficulties   Cognition Arousal/Alertness: Awake/alert Behavior During Therapy: WFL for tasks assessed/performed Overall Cognitive Status: Impaired/Different from baseline Area of Impairment: Orientation;Memory;Attention;Following commands;Safety/judgement;Awareness;Problem solving                 Orientation Level: Disoriented to;Situation Current Attention Level: Sustained Memory: Decreased short-term memory Following Commands: Follows one step commands with increased time Safety/Judgement: Decreased awareness of safety;Decreased awareness of deficits Awareness: Emergent Problem Solving: Slow processing;Decreased initiation;Difficulty sequencing;Requires verbal cues;Requires tactile cues General Comments: Reports having a "spell" last night (confusion), difficulty problem solving and sustained attention, perseverating on calling daughter before initiating movement   General Comments  VSS though endorses SOB with activity - normalizes with seated rest break    Exercises     Shoulder Instructions      Home Living Family/patient expects to be discharged to:: Private residence Living Arrangements: Alone Available Help at Discharge: Family;Available PRN/intermittently Type of Home: House Home Access: Stairs to enter [employer] of Steps: 1   Home Layout: One level     Bathroom Shower/Tub: Producer, television/film/video: Handicapped height (BSC or rise over it)     Home Equipment: Environmental consultant - 2 wheels;Cane - single point;Shower seat;Toilet riser   Additional Comments:  reports daughter works, unable to stay at daughters home and she is unable to provide 24/7      Prior Functioning/Environment Level of Independence: Needs assistance  Gait / Transfers Assistance Needed: uses RW for mobility ADL's / Homemaking Assistance Needed: reports able to complete ADLs though difficult. Reports difficulty with toilet transfers despite having handicapped height and armrests. does simple microwave meals at home, family assist with laundry as needed. has not been driving or grocery shopping since falling in february   Comments: reports frequent falls        OT Problem List: Decreased strength;Decreased activity tolerance;Impaired balance (sitting and/or standing);Decreased cognition;Decreased safety awareness;Decreased knowledge of use of DME or AE      OT Treatment/Interventions: Self-care/ADL training;Therapeutic exercise;Energy conservation;DME and/or AE instruction;Therapeutic activities;Patient/family education;Balance training    OT Goals(Current goals can be found in the care plan section) Acute Rehab OT Goals Patient Stated Goal: I want to get home. OT Goal Formulation: With patient Time For Goal Achievement: 12/16/20 Potential to Achieve Goals: Good  OT Frequency: Min 2X/week   Barriers to D/C:            Co-evaluation              AM-PAC OT "6 Clicks" Daily Activity     Outcome Measure Help from another person eating meals?: A Little Help from another person taking care of personal grooming?: A Little Help from another person toileting, which includes using toliet, bedpan, or urinal?: A Lot Help from another person bathing (including washing, rinsing, drying)?: A Lot Help from another person to put on and taking off regular upper body clothing?: A Little Help from another person to put on and taking off regular  lower body clothing?: A Lot 6 Click Score: 15   End of Session Equipment Utilized During Treatment: Engineer, water  Communication: Mobility status;Other (comment) (NT - purewick and pad assist)  Activity Tolerance: Patient tolerated treatment well Patient left: in chair;with call bell/phone within reach;with chair alarm set  OT Visit Diagnosis: Unsteadiness on feet (R26.81);Other abnormalities of gait and mobility (R26.89);Muscle weakness (generalized) (M62.81);Other symptoms and signs involving cognitive function                Time: 0702-0740 OT Time Calculation (min): 38 min Charges:  OT General Charges $OT Visit: 1 Visit OT Evaluation $OT Eval Moderate Complexity: 1 Mod OT Treatments $Self Care/Home Management : 8-22 mins $Therapeutic Activity: 8-22 mins  Bradd Canary, OTR/L Acute Rehab Services Office: [phone]   Lorre Munroe 12/02/2020, 8:13 AM

## 2020-12-02 NOTE — Progress Notes (Signed)
Pt noted to have decreased urine output over the shift 0700 to 1900, Dr. Margo Aye notified, orders placed for 1x straight cath. WCTM.

## 2020-12-03 DIAGNOSIS — R0600 Dyspnea, unspecified: Secondary | ICD-10-CM

## 2020-12-03 DIAGNOSIS — M158 Other polyosteoarthritis: Secondary | ICD-10-CM

## 2020-12-03 DIAGNOSIS — G894 Chronic pain syndrome: Secondary | ICD-10-CM

## 2020-12-03 DIAGNOSIS — I272 Pulmonary hypertension, unspecified: Secondary | ICD-10-CM

## 2020-12-03 DIAGNOSIS — I5033 Acute on chronic diastolic (congestive) heart failure: Secondary | ICD-10-CM

## 2020-12-03 LAB — BASIC METABOLIC PANEL
Anion gap: 11 (ref 5–15)
BUN: 30 mg/dL — ABNORMAL HIGH (ref 8–23)
CO2: 28 mmol/L (ref 22–32)
Calcium: 8.3 mg/dL — ABNORMAL LOW (ref 8.9–10.3)
Chloride: 100 mmol/L (ref 98–111)
Creatinine, Ser: 1.07 mg/dL — ABNORMAL HIGH (ref 0.44–1.00)
GFR, Estimated: 48 mL/min — ABNORMAL LOW (ref >60)
Glucose, Bld: 83 mg/dL (ref 70–99)
Potassium: 3.8 mmol/L (ref 3.5–5.1)
Sodium: 139 mmol/L (ref 135–145)

## 2020-12-03 LAB — COMPREHENSIVE METABOLIC PANEL
ALT: 16 U/L (ref 0–44)
AST: 31 U/L (ref 15–41)
Albumin: 2.5 g/dL — ABNORMAL LOW (ref 3.5–5.0)
Alkaline Phosphatase: 120 U/L (ref 38–126)
Anion gap: 7 (ref 5–15)
BUN: 27 mg/dL — ABNORMAL HIGH (ref 8–23)
CO2: 29 mmol/L (ref 22–32)
Calcium: 8.4 mg/dL — ABNORMAL LOW (ref 8.9–10.3)
Chloride: 103 mmol/L (ref 98–111)
Creatinine, Ser: 1.13 mg/dL — ABNORMAL HIGH (ref 0.44–1.00)
GFR, Estimated: 45 mL/min — ABNORMAL LOW (ref >60)
Glucose, Bld: 105 mg/dL — ABNORMAL HIGH (ref 70–99)
Potassium: 3.9 mmol/L (ref 3.5–5.1)
Sodium: 139 mmol/L (ref 135–145)
Total Bilirubin: 0.7 mg/dL (ref 0.3–1.2)
Total Protein: 5.6 g/dL — ABNORMAL LOW (ref 6.5–8.1)

## 2020-12-03 LAB — CBC WITH DIFFERENTIAL/PLATELET
Abs Immature Granulocytes: 0.03 10*3/uL (ref 0.00–0.07)
Basophils Absolute: 0 10*3/uL (ref 0.0–0.1)
Basophils Relative: 1 %
Eosinophils Absolute: 0.3 10*3/uL (ref 0.0–0.5)
Eosinophils Relative: 4 %
HCT: 35.7 % — ABNORMAL LOW (ref 36.0–46.0)
Hemoglobin: 11.7 g/dL — ABNORMAL LOW (ref 12.0–15.0)
Immature Granulocytes: 0 %
Lymphocytes Relative: 18 %
Lymphs Abs: 1.5 10*3/uL (ref 0.7–4.0)
MCH: 31.5 pg (ref 26.0–34.0)
MCHC: 32.8 g/dL (ref 30.0–36.0)
MCV: 96.2 fL (ref 80.0–100.0)
Monocytes Absolute: 0.7 10*3/uL (ref 0.1–1.0)
Monocytes Relative: 9 %
Neutro Abs: 5.7 10*3/uL (ref 1.7–7.7)
Neutrophils Relative %: 68 %
Platelets: 216 10*3/uL (ref 150–400)
RBC: 3.71 MIL/uL — ABNORMAL LOW (ref 3.87–5.11)
RDW: 14.7 % (ref 11.5–15.5)
WBC: 8.2 10*3/uL (ref 4.0–10.5)
nRBC: 0 % (ref 0.0–0.2)

## 2020-12-03 LAB — MAGNESIUM: Magnesium: 1.7 mg/dL (ref 1.7–2.4)

## 2020-12-03 LAB — PHOSPHORUS: Phosphorus: 3 mg/dL (ref 2.5–4.6)

## 2020-12-03 NOTE — Progress Notes (Signed)
PROGRESS NOTE    Brenton GrillsOpal M Pitts  ONG:[id number]:[id number] DOB: 1923-06-30 DOA: 11/30/2020 PCP: Jarome MatinPaterson, [name], MD     Brief Narrative:  Susan Pitts was admitted to the hospital with the working diagnosis of acute on chronic diastolic heart failure decompensation.   87 yo WF PMHx  Anxiety and DepressionHTN, PVD, Arthritis,    Presented with dyspnea and lower extremity edema.  Reported 3 weeks of worsening dyspnea, associated lower extremity edema but no chest pain.  On her initial physical examination blood pressure 178/71, heart rate 61, respiratory rate 23, temperature 97.6, oxygen saturation 94%.  She had dry mucous membranes, her lungs were clear to auscultation bilaterally, heart S1-S2, present, rhythmic, positive lower extremity edema.   Sodium 141, potassium 4.5, chloride 105, bicarb 25, glucose 108, BUN 31, creatinine 1.09, BNP 1310, high sensitive troponin 40-47-61, white count 8.3, hemoglobin 12.3, hematocrit 37.9, platelets 272. SARS COVID-19 negative.   Urinalysis specific gravity 1.009.   Chest radiograph with increased lung markings bilaterally, film is left-sided rotated with prominent hilar vasculature on the right.   EKG 74 bpm, normal axis, normal intervals, sinus rhythm with PVCs, no significant ST segment or T wave changes.   Patient placed on aggressive diuresis with improvement of her symptoms. Continue to be very weak and deconditioned, plan to transfer to SNF.    Subjective: A/O x4, states never informed she had heart failure in the past.   Assessment & Plan: Covid vaccination; vaccinated 2/3   Active Problems:   Anxiety disorder   Chronic pain   Essential hypertension   Peripheral vascular disease (HCC)   Acute on chronic diastolic CHF (congestive heart failure) (HCC)   Pulmonary hypertension (HCC)   PVD (peripheral vascular disease) (HCC)   Dyspnea on exertion   Anxiety   DJD (degenerative joint disease)  Acute on chronic diastolic CHF, severe chronic  pulmonary HTN.  -Strict in and out - Daily weight - Orthostatic vitals q shift: Patient is preload dependent -Benazepril 40 mg daily - Lasix 40 mg daily - Metoprolol 50 mg daily -  Essential HTN - See CHF  PVD - Raloxifene 60 mg daily   DOE - 7/6 obtain ambulatory SPO2   CKD stage IIIb (baseline  Cr ~1.2) Lab Results  Component Value Date   CREATININE 1.16 (H) 12/04/2020   CREATININE 1.13 (H) 12/03/2020   CREATININE 1.07 (H) 12/03/2020   CREATININE 1.22 (H) 12/02/2020   CREATININE 1.25 (H) 12/01/2020   Anxiety - Diazepam 5 mg BID PRN  DJD -Pain control with Tylenol and hydrocodone.   Goals of care - Awaiting SNF placement plan to transfer to SNF     DVT prophylaxis: Lovenox Code Status: Full Family Communication: 7/6 daughter at bedside for discussion plan of care all questions answered Status is: Inpatient   Remains inpatient appropriate because:Inpatient level of care appropriate due to severity of illness   Dispo: The patient is from: Home              Anticipated d/c is to: SNF              Patient currently is not medically stable to d/c.              Difficult to place patient No      Consultants:    Procedures/Significant Events:  7/4 echocardiogram:Left Ventricle: LVEF 50 to 55%. The left ventricle has low normal function.  Left ventricular diastolic parameters are indeterminate.  -Elevated left atrial pressure.  -Right Ventricle: severely  elevated pulmonary artery systolic pressure.  the  estimated right ventricular systolic pressure is 65.8 mmHg.  Left Atrium: severely dilated.     I have personally reviewed and interpreted all radiology studies and my findings are as above.  VENTILATOR SETTINGS:    Cultures   Antimicrobials:    Devices    LINES / TUBES:      Continuous Infusions:   Objective: Vitals:   12/03/20 2045 12/04/20 0301 12/04/20 0512 12/04/20 0845  BP: (!) 158/61  (!) 143/57 (!) 135/52  Pulse: 68  62  65  Resp: 18  20   Temp: 97.6 F (36.4 C)  97.6 F (36.4 C) 98 F (36.7 C)  TempSrc: Oral  Oral Oral  SpO2: 94%  93% 96%  Weight:  75.5 kg    Height:        Intake/Output Summary (Last 24 hours) at 12/04/2020 0945 Last data filed at 12/04/2020 0932 Gross per 24 hour  Intake 703 ml  Output 500 ml  Net 203 ml   Filed Weights   12/02/20 0256 12/03/20 0408 12/04/20 0301  Weight: 74.4 kg 76.4 kg 75.5 kg    Examination:  General: A/O x4, positive acute respiratory distress Eyes: negative scleral hemorrhage, negative anisocoria, negative icterus ENT: Negative Runny nose, negative gingival bleeding, Neck:  Negative scars, masses, torticollis, lymphadenopathy, JVD Lungs: tachypneic, clear to auscultation bilaterally without wheezes or crackles Cardiovascular: Regular rate and rhythm without murmur gallop or rub normal S1 and S2 Abdomen: negative abdominal pain, nondistended, positive soft, bowel sounds, no rebound, no ascites, no appreciable mass Extremities: No significant cyanosis, clubbing, or edema bilateral lower extremities Skin: Negative rashes, lesions, ulcers Psychiatric:  Negative depression, negative anxiety, negative fatigue, negative mania  Central nervous system:  Cranial nerves II through XII intact, tongue/uvula midline, all extremities muscle strength 5/5, sensation intact throughout, negative dysarthria, negative expressive aphasia, negative receptive aphasia.  .     Data Reviewed: Care during the described time interval was provided by me .  I have reviewed this patient's available data, including medical history, events of note, physical examination, and all test results as part of my evaluation.  CBC: Recent Labs  Lab 11/30/20 1130 12/01/20 0235 12/03/20 1007 12/04/20 0232  WBC 8.3 9.6 8.2 8.7  NEUTROABS 6.5  --  5.7 5.8  HGB 12.3 12.7 11.7* 11.4*  HCT 37.9 38.3 35.7* 34.1*  MCV 96.2 96.0 96.2 96.3  PLT 272 265 216 219   Basic Metabolic Panel: Recent  Labs  Lab 11/30/20 2006 12/01/20 0235 12/02/20 0245 12/03/20 0317 12/03/20 1007 12/04/20 0232  NA  --  137 139 139 139 138  K  --  3.9 3.5 3.8 3.9 3.6  CL  --  104 101 100 103 101  CO2  --  26 29 28 29  32  GLUCOSE  --  105* 97 83 105* 84  BUN  --  29* 30* 30* 27* 31*  CREATININE  --  1.25* 1.22* 1.07* 1.13* 1.16*  CALCIUM  --  8.9 8.9 8.3* 8.4* 8.0*  MG 1.7  --   --   --  1.7 1.8  PHOS  --   --   --   --  3.0 3.2   GFR: Estimated Creatinine Clearance: 27.6 mL/min (A) (by C-G formula based on SCr of 1.16 mg/dL (H)). Liver Function Tests: Recent Labs  Lab 12/03/20 1007 12/04/20 0232  AST 31 27  ALT 16 15  ALKPHOS 120 109  BILITOT 0.7 0.8  PROT 5.6* 5.0*  ALBUMIN 2.5* 2.2*   No results for input(s): LIPASE, AMYLASE in the last 168 hours. No results for input(s): AMMONIA in the last 168 hours. Coagulation Profile: No results for input(s): INR, PROTIME in the last 168 hours. Cardiac Enzymes: No results for input(s): CKTOTAL, CKMB, CKMBINDEX, TROPONINI in the last 168 hours. BNP (last 3 results) No results for input(s): PROBNP in the last 8760 hours. HbA1C: No results for input(s): HGBA1C in the last 72 hours. CBG: No results for input(s): GLUCAP in the last 168 hours. Lipid Profile: No results for input(s): CHOL, HDL, LDLCALC, TRIG, CHOLHDL, LDLDIRECT in the last 72 hours. Thyroid Function Tests: No results for input(s): TSH, T4TOTAL, FREET4, T3FREE, THYROIDAB in the last 72 hours. Anemia Panel: Recent Labs    12/04/20 0232  VITAMINB12 1,669*  FOLATE 21.7  FERRITIN 79  TIBC 207*  IRON 31  RETICCTPCT 2.5   Sepsis Labs: No results for input(s): PROCALCITON, LATICACIDVEN in the last 168 hours.  Recent Results (from the past 240 hour(s))  Resp Panel by RT-PCR (Flu A&B, Covid) Nasopharyngeal Swab     Status: None   Collection Time: 11/30/20 12:18 PM   Specimen: Nasopharyngeal Swab; Nasopharyngeal(NP) swabs in vial transport medium  Result Value Ref Range  Status   SARS Coronavirus 2 by RT PCR NEGATIVE NEGATIVE Final    Comment: (NOTE) SARS-CoV-2 target nucleic acids are NOT DETECTED.  The SARS-CoV-2 RNA is generally detectable in upper respiratory specimens during the acute phase of infection. The lowest concentration of SARS-CoV-2 viral copies this assay can detect is 138 copies/mL. A negative result does not preclude SARS-Cov-2 infection and should not be used as the sole basis for treatment or other patient management decisions. A negative result may occur with  improper specimen collection/handling, submission of specimen other than nasopharyngeal swab, presence of viral mutation(s) within the areas targeted by this assay, and inadequate number of viral copies(<138 copies/mL). A negative result must be combined with clinical observations, patient history, and epidemiological information. The expected result is Negative.  Fact Sheet for Patients:  BloggerCourse.com  Fact Sheet for Healthcare Providers:  SeriousBroker.it  This test is no t yet approved or cleared by the [place] FDA and  has been authorized for detection and/or diagnosis of SARS-CoV-2 by FDA under an Emergency Use Authorization (EUA). This EUA will remain  in effect (meaning this test can be used) for the duration of the COVID-19 declaration under Section 564(b)(1) of the Act, 21 U.S.C.section 360bbb-3(b)(1), unless the authorization is terminated  or revoked sooner.       Influenza A by PCR NEGATIVE NEGATIVE Final   Influenza B by PCR NEGATIVE NEGATIVE Final    Comment: (NOTE) The Xpert Xpress SARS-CoV-2/FLU/RSV plus assay is intended as an aid in the diagnosis of influenza from Nasopharyngeal swab specimens and should not be used as a sole basis for treatment. Nasal washings and aspirates are unacceptable for Xpert Xpress SARS-CoV-2/FLU/RSV testing.  Fact Sheet for  Patients: BloggerCourse.com  Fact Sheet for Healthcare Providers: SeriousBroker.it  This test is not yet approved or cleared by the [place] FDA and has been authorized for detection and/or diagnosis of SARS-CoV-2 by FDA under an Emergency Use Authorization (EUA). This EUA will remain in effect (meaning this test can be used) for the duration of the COVID-19 declaration under Section 564(b)(1) of the Act, 21 U.S.C. section 360bbb-3(b)(1), unless the authorization is terminated or revoked.  Performed at [facility], [address], [place],  [place] [address]          Radiology Studies: No results found.      Scheduled Meds:  aspirin EC  81 mg Oral Daily   benazepril  40 mg Oral Daily   enoxaparin (LOVENOX) injection  30 mg Subcutaneous Q24H   furosemide  40 mg Oral Daily   metoprolol succinate  50 mg Oral Daily   multivitamin with minerals  1 tablet Oral Daily   raloxifene  60 mg Oral Daily   sodium chloride flush  3 mL Intravenous Q12H   Continuous Infusions:   LOS: 4 days    Time spent:40 min    Calyn Sivils, Roselind Messier, MD [facility]   If 7PM-7AM, please contact night-coverage 12/04/2020, 9:45 AM

## 2020-12-03 NOTE — Progress Notes (Signed)
Physical Therapy Treatment Patient Details Name: Susan Pitts MRN: [id number] DOB: 12/27/1923 Today's Date: 12/03/2020    History of Present Illness Pt is a 87 y/o femaleadm 7/3 with worsening edema and SOB due to acute on chronic diastolic heart failure. PMH - chf, pvd, HTN, anxiety.    PT Comments    Pt received in supine, agreeable to therapy session and with good participation and fair tolerance for gait and transfer training. Pt noted to be incontinent upon PTA arrival to room and needing min guard for rolling multiple reps/bathing UE and modA for transfer to EOB. Pt needing increased physical assist for transfers and gait this date due to poor attention to task and heavy trunk flexion/poor posture. Pt fatigued more quickly today and only able to perform short household distance gait task prior to returning to chair. Encouraged pt to continue seated/supine LE HEP per handout. Pt continues to benefit from PT services to progress toward functional mobility goals.    Follow Up Recommendations  SNF;Supervision/Assistance - 24 hour (per chart review, pt does not have 24/7 assist at home)     Equipment Recommendations  None recommended by PT    Recommendations for Other Services       Precautions / Restrictions Precautions Precautions: Fall Precaution Comments: falls at home, incontinence Restrictions Weight Bearing Restrictions: No    Mobility  Bed Mobility Overal bed mobility: Needs Assistance Bed Mobility: Rolling;Sidelying to Sit Rolling: Min guard Sidelying to sit: Mod assist       General bed mobility comments: cues to use bedrail and self-assist; rolling multiple reps for cleanup due to wet bed from incontinence, pt with good initiation to roll but needs modA for trunk rise/hip scooting to foot flat    Transfers Overall transfer level: Needs assistance Equipment used: Rolling walker (2 wheeled) Transfers: Sit to/from Stand Sit to Stand: Mod assist          General transfer comment: Initially Mod A for sit to stand from bedside, cues for hand placement and modA for anterior weight shift; pt takes increased time to raise trunk once hands at RW.  Ambulation/Gait Ambulation/Gait assistance: Min assist Gait Distance (Feet): 40 Feet Assistive device: Rolling walker (2 wheeled) Gait Pattern/deviations: Step-through pattern;Trunk flexed;Decreased stride length   Gait velocity interpretation: <1.8 ft/sec, indicate of risk for recurrent falls General Gait Details: cues for posture and better proximity in the RW.  Pt flexed forward walking behind the RW, short, mildly unsteady steps.  No dyspnea this date, HR WNL per monitor   Stairs             Wheelchair Mobility    Modified Rankin (Stroke Patients Only)       Balance Overall balance assessment: Needs assistance Sitting-balance support: No upper extremity supported;Feet supported Sitting balance-Leahy Scale: Fair     Standing balance support: Single extremity supported;Bilateral upper extremity supported;During functional activity Standing balance-Leahy Scale: Poor Standing balance comment: reliant on at least one UE support with static standing, BUE support for dynamic tasks, flexed posture throughout needs min guard to minA                            Cognition Arousal/Alertness: Awake/alert Behavior During Therapy: [facility] for tasks assessed/performed;Anxious Overall Cognitive Status: Impaired/Different from baseline Area of Impairment: Orientation;Memory;Attention;Following commands;Safety/judgement;Awareness;Problem solving                 Orientation Level: Disoriented to;Situation;Place Current Attention Level: Sustained Memory:  Decreased short-term memory Following Commands: Follows one step commands with increased time Safety/Judgement: Decreased awareness of safety;Decreased awareness of deficits Awareness: Emergent Problem Solving: Slow  processing;Decreased initiation;Difficulty sequencing;Requires verbal cues;Requires tactile cues General Comments: Pt internally distracted due to incontinence and purewick not working well as well as being bathed "too roughly" which led to scabs on her ankles re-opening and bleeding a little, difficult to redirect pt from this. Pt also demos difficulty problem solving (unable to maintain upright posture/proximity to RW).      Exercises Other Exercises Other Exercises: seated BLE AROM: ankle pumps, hip flexion, LAQ x5-10 reps ea (pt distracted and poor focus on activity, handout for reinforcement)    General Comments General comments (skin integrity, edema, etc.): no acute s/sx distress and HR WNL; throughout session, pt anxious/perseverating on things each staff member did wrong prior to this therapist entering room.      Pertinent Vitals/Pain Pain Assessment: Faces Faces Pain Scale: Hurts little more Pain Location: B feet/ankles and L knee (ankle scabs opened during bathing, pt perseverating on discomfort there) Pain Descriptors / Indicators: Sore;Tightness;Discomfort Pain Intervention(s): Limited activity within patient's tolerance;Monitored during session;Repositioned    Home Living                      Prior Function            PT Goals (current goals can now be found in the care plan section) Acute Rehab PT Goals Patient Stated Goal: I want to get home. PT Goal Formulation: With patient Time For Goal Achievement: 12/08/20 Progress towards PT goals: Progressing toward goals (pain limited)    Frequency    Min 3X/week      PT Plan Current plan remains appropriate    Co-evaluation              AM-PAC PT "6 Clicks" Mobility   Outcome Measure  Help needed turning from your back to your side while in a flat bed without using bedrails?: A Little Help needed moving from lying on your back to sitting on the side of a flat bed without using bedrails?: A  Lot Help needed moving to and from a bed to a chair (including a wheelchair)?: A Little Help needed standing up from a chair using your arms (e.g., wheelchair or bedside chair)?: A Lot Help needed to walk in hospital room?: A Little Help needed climbing 3-5 steps with a railing? : A Lot 6 Click Score: 15    End of Session Equipment Utilized During Treatment: Gait belt Activity Tolerance: Patient limited by pain;Patient tolerated treatment well Patient left: in chair;with call bell/phone within reach;with chair alarm set;Other (comment) (heels floated, warm blanket, NT notified pt needs new purewick placed and band-aids for ankle scabs/bleeding) Nurse Communication: Mobility status;Other (comment) (pt frustrated with all staff members) PT Visit Diagnosis: Unsteadiness on feet (R26.81);Muscle weakness (generalized) (M62.81)     Time: 2297-9892 PT Time Calculation (min) (ACUTE ONLY): 32 min  Charges:  $Gait Training: 8-22 mins $Therapeutic Activity: 8-22 mins                     Aleni Andrus P., PTA Acute Rehabilitation Services Pager: [phone] Office: [phone]    Dorathy Kinsman Annastyn Silvey 12/03/2020, 2:20 PM

## 2020-12-03 NOTE — TOC Progression Note (Signed)
Transition of Care [facility]) - Progression Note    Patient Details  Name: Susan Pitts MRN: [id number] Date of Birth: 1923/07/12  [facility] CM/SW Contact  Ivette Loyal, [place] Phone Number: 12/03/2020, 5:47 PM  Clinical Narrative:    1345: CSW spoke with pt who stated she is okay with going to a SNF and would like CSW to follow up with with daughter. CSW left [email or web address] list with highlighted facilities of those who have offered a bed.  1750: CSW spoke with pt daughter about Snf placement. Pt daughter would like [facility], they have not made an offer yet, CSW will contact [facility] for availability.    Expected Discharge Plan: Skilled Nursing Facility Barriers to Discharge: Continued Medical Work up  Expected Discharge Plan and Services Expected Discharge Plan: Skilled Nursing Facility   Discharge Planning Services: CM Consult Post Acute Care Choice: Skilled Nursing Facility Living arrangements for the past 2 months: Single Family Home                   DME Agency: NA       HH Arranged: NA           Social Determinants of Health (SDOH) Interventions    Readmission Risk Interventions No flowsheet data found.

## 2020-12-04 DIAGNOSIS — R609 Edema, unspecified: Secondary | ICD-10-CM

## 2020-12-04 DIAGNOSIS — M1990 Unspecified osteoarthritis, unspecified site: Secondary | ICD-10-CM | POA: Diagnosis present

## 2020-12-04 DIAGNOSIS — R0609 Other forms of dyspnea: Secondary | ICD-10-CM | POA: Diagnosis present

## 2020-12-04 DIAGNOSIS — I739 Peripheral vascular disease, unspecified: Secondary | ICD-10-CM | POA: Diagnosis present

## 2020-12-04 DIAGNOSIS — R0600 Dyspnea, unspecified: Secondary | ICD-10-CM | POA: Diagnosis present

## 2020-12-04 DIAGNOSIS — I272 Pulmonary hypertension, unspecified: Secondary | ICD-10-CM | POA: Diagnosis present

## 2020-12-04 DIAGNOSIS — I5033 Acute on chronic diastolic (congestive) heart failure: Secondary | ICD-10-CM | POA: Diagnosis present

## 2020-12-04 DIAGNOSIS — F419 Anxiety disorder, unspecified: Secondary | ICD-10-CM | POA: Diagnosis present

## 2020-12-04 LAB — COMPREHENSIVE METABOLIC PANEL
ALT: 15 U/L (ref 0–44)
AST: 27 U/L (ref 15–41)
Albumin: 2.2 g/dL — ABNORMAL LOW (ref 3.5–5.0)
Alkaline Phosphatase: 109 U/L (ref 38–126)
Anion gap: 5 (ref 5–15)
BUN: 31 mg/dL — ABNORMAL HIGH (ref 8–23)
CO2: 32 mmol/L (ref 22–32)
Calcium: 8 mg/dL — ABNORMAL LOW (ref 8.9–10.3)
Chloride: 101 mmol/L (ref 98–111)
Creatinine, Ser: 1.16 mg/dL — ABNORMAL HIGH (ref 0.44–1.00)
GFR, Estimated: 43 mL/min — ABNORMAL LOW (ref >60)
Glucose, Bld: 84 mg/dL (ref 70–99)
Potassium: 3.6 mmol/L (ref 3.5–5.1)
Sodium: 138 mmol/L (ref 135–145)
Total Bilirubin: 0.8 mg/dL (ref 0.3–1.2)
Total Protein: 5 g/dL — ABNORMAL LOW (ref 6.5–8.1)

## 2020-12-04 LAB — CBC WITH DIFFERENTIAL/PLATELET
Abs Immature Granulocytes: 0.01 10*3/uL (ref 0.00–0.07)
Basophils Absolute: 0.1 10*3/uL (ref 0.0–0.1)
Basophils Relative: 1 %
Eosinophils Absolute: 0.4 10*3/uL (ref 0.0–0.5)
Eosinophils Relative: 5 %
HCT: 34.1 % — ABNORMAL LOW (ref 36.0–46.0)
Hemoglobin: 11.4 g/dL — ABNORMAL LOW (ref 12.0–15.0)
Immature Granulocytes: 0 %
Lymphocytes Relative: 19 %
Lymphs Abs: 1.7 10*3/uL (ref 0.7–4.0)
MCH: 32.2 pg (ref 26.0–34.0)
MCHC: 33.4 g/dL (ref 30.0–36.0)
MCV: 96.3 fL (ref 80.0–100.0)
Monocytes Absolute: 0.7 10*3/uL (ref 0.1–1.0)
Monocytes Relative: 8 %
Neutro Abs: 5.8 10*3/uL (ref 1.7–7.7)
Neutrophils Relative %: 67 %
Platelets: 219 10*3/uL (ref 150–400)
RBC: 3.54 MIL/uL — ABNORMAL LOW (ref 3.87–5.11)
RDW: 14.6 % (ref 11.5–15.5)
WBC: 8.7 10*3/uL (ref 4.0–10.5)
nRBC: 0 % (ref 0.0–0.2)

## 2020-12-04 LAB — RETICULOCYTES
Immature Retic Fract: 12.7 % (ref 2.3–15.9)
RBC.: 3.52 MIL/uL — ABNORMAL LOW (ref 3.87–5.11)
Retic Count, Absolute: 89.4 10*3/uL (ref 19.0–186.0)
Retic Ct Pct: 2.5 % (ref 0.4–3.1)

## 2020-12-04 LAB — IRON AND TIBC
Iron: 31 ug/dL (ref 28–170)
Saturation Ratios: 15 % (ref 10.4–31.8)
TIBC: 207 ug/dL — ABNORMAL LOW (ref 250–450)
UIBC: 176 ug/dL

## 2020-12-04 LAB — RESP PANEL BY RT-PCR (FLU A&B, COVID) ARPGX2
Influenza A by PCR: NEGATIVE
Influenza B by PCR: NEGATIVE
SARS Coronavirus 2 by RT PCR: NEGATIVE

## 2020-12-04 LAB — MAGNESIUM: Magnesium: 1.8 mg/dL (ref 1.7–2.4)

## 2020-12-04 LAB — FERRITIN: Ferritin: 79 ng/mL (ref 11–307)

## 2020-12-04 LAB — FOLATE: Folate: 21.7 ng/mL (ref >5.9)

## 2020-12-04 LAB — VITAMIN B12: Vitamin B-12: 1669 pg/mL — ABNORMAL HIGH (ref 180–914)

## 2020-12-04 LAB — PHOSPHORUS: Phosphorus: 3.2 mg/dL (ref 2.5–4.6)

## 2020-12-04 MED ORDER — SORBITOL 70 % SOLN
400.0000 mL | TOPICAL_OIL | Freq: Once | ORAL | Status: AC
  Administered 2020-12-04: 400 mL via RECTAL
  Filled 2020-12-04: qty 120

## 2020-12-04 NOTE — Progress Notes (Signed)
  Mobility Specialist Criteria Algorithm Info.  SATURATION QUALIFICATIONS: (This note is used to comply with regulatory documentation for home oxygen)  Patient Saturations on Room Air at Rest = 96%  Patient Saturations on Room Air while Ambulating = 95%  Patient Saturations on 0 Liters of oxygen while Ambulating = n/a%  Please briefly explain why patient needs home oxygen:  Mobility Team:  [facility] elevated:HOB 30 Activity: Ambulated in hall; Ambulated in room; Transferred:  Bed to chair (to chair after ambulation) Range of motion: Active; All extremities Level of assistance: Moderate assist, patient does 50-74% (Mod A EOB & sit/stand (cues for hand placement)) Assistive device: Front wheel walker Minutes sitting in chair:  Minutes stood: 8 minutes Minutes ambulated: 5 minutes Distance ambulated (ft): 50 ft Mobility response: Tolerated well Bed Position: Chair  Patient received lying supine in bed eager to participate in mobility. Prior to OOB mobility, she complained of burning/raw sensation on sacral area. RN acknowledged and resolved issue. RN present throughout to assist w/orthostatic VS. Required Mod A + cues to use side rail supine>sit and Mod A sit>stand + cues for hand placement. Upon standing pt with flexed trunk & LOB x1 requiring minimal assist to steady and gather balance. Patient able to stand for extended period of time to complete orthostatic VS without incident. She then ambulated 40 feet in hallway with steady gait at min guard using RW. Cues to stand closer to RW. Tolerated ambulating in hallway well without complaint or incident and was left sitting in recliner chair with all needs met and RN present.   12/04/2020 12:10 PM

## 2020-12-04 NOTE — Progress Notes (Signed)
Occupational Therapy Treatment Patient Details Name: Susan Pitts MRN: [id number] DOB: 02-08-1924 Today's Date: 12/04/2020    History of present illness Pt is a 87 y/o female adm 7/3 with worsening edema and SOB due to acute on chronic diastolic heart failure. PMH - chf, pvd, HTN, anxiety.   OT comments  Patient with incremental progress toward patient focused OT goals.  Patient with increased lethargy and leg pain, stating she has been up in the recliner too long.  Patient returned to bed, needing up to Mod A for basic transfers and bed mobility.  She was setup for light grooming and for self feeding.  Snf continues to be recommended, but OT will follow her in the acute setting.    Follow Up Recommendations  SNF;Supervision/Assistance - 24 hour    Equipment Recommendations  3 in 1 bedside commode    Recommendations for Other Services      Precautions / Restrictions Precautions Precautions: Fall Restrictions Weight Bearing Restrictions: No       Mobility Bed Mobility   Bed Mobility: Sit to Supine       Sit to supine: Min assist;Mod assist     Patient Response: Anxious  Transfers Overall transfer level: Needs assistance Equipment used: Rolling walker (2 wheeled) Transfers: Sit to/from [employer] Sit to Stand: Mod assist Stand pivot transfers: Mod assist;Min assist       General transfer comment: patient has been up in the recliner for 3 hours per her report    Balance Overall balance assessment: Needs assistance Sitting-balance support: No upper extremity supported;Feet supported Sitting balance-Leahy Scale: Fair     Standing balance support: Bilateral upper extremity supported Standing balance-Leahy Scale: Poor Standing balance comment: needs external suppot                           ADL either performed or assessed with clinical judgement   ADL   Eating/Feeding: Set up;Sitting   Grooming: Supervision/safety;Sitting                                                                                                                            Pertinent Vitals/ Pain       Faces Pain Scale: Hurts little more Pain Location: both feet and ankles Pain Descriptors / Indicators: Tender;Grimacing;Guarding Pain Intervention(s): Monitored during session                                                          Frequency  Min 2X/week        Progress Toward Goals  OT Goals(current goals can now be found in the care plan section)  Progress towards OT goals: Progressing toward goals  Acute Rehab OT Goals Patient Stated Goal: hoping to get  back home OT Goal Formulation: With patient Time For Goal Achievement: 12/16/20 Potential to Achieve Goals: Good  Plan Discharge plan remains appropriate    Co-evaluation                 AM-PAC OT "6 Clicks" Daily Activity     Outcome Measure   Help from another person eating meals?: A Little Help from another person taking care of personal grooming?: A Little Help from another person toileting, which includes using toliet, bedpan, or urinal?: A Lot Help from another person bathing (including washing, rinsing, drying)?: A Lot Help from another person to put on and taking off regular upper body clothing?: A Little Help from another person to put on and taking off regular lower body clothing?: A Lot 6 Click Score: 15    End of Session Equipment Utilized During Treatment: Rolling walker  OT Visit Diagnosis: Unsteadiness on feet (R26.81);Other abnormalities of gait and mobility (R26.89);Muscle weakness (generalized) (M62.81);Other symptoms and signs involving cognitive function   Activity Tolerance Patient tolerated treatment well   Patient Left in bed;with call bell/phone within reach;with bed alarm set   Nurse Communication          Time: 1700-1715 OT Time Calculation (min): 15  min  Charges: OT General Charges $OT Visit: 1 Visit OT Treatments $Self Care/Home Management : 8-22 mins  12/04/2020  Rich, OTR/L  Acute Rehabilitation Services  Office:  [phone]    Suzanna Obey 12/04/2020, 5:21 PM

## 2020-12-04 NOTE — Progress Notes (Signed)
PROGRESS NOTE    Susan Pitts  PIR:[id number] DOB: 20-Dec-1923 DOA: 11/30/2020 PCP: Jarome Matin, MD     Brief Narrative:  [name] was admitted to the hospital with the working diagnosis of acute on chronic diastolic heart failure decompensation.   87 yo WF PMHx  Anxiety and DepressionHTN, PVD, Arthritis,    Presented with dyspnea and lower extremity edema.  Reported 3 weeks of worsening dyspnea, associated lower extremity edema but no chest pain.  On her initial physical examination blood pressure 178/71, heart rate 61, respiratory rate 23, temperature 97.6, oxygen saturation 94%.  She had dry mucous membranes, her lungs were clear to auscultation bilaterally, heart S1-S2, present, rhythmic, positive lower extremity edema.   Sodium 141, potassium 4.5, chloride 105, bicarb 25, glucose 108, BUN 31, creatinine 1.09, BNP 1310, high sensitive troponin 40-47-61, white count 8.3, hemoglobin 12.3, hematocrit 37.9, platelets 272. SARS COVID-19 negative.   Urinalysis specific gravity 1.009.   Chest radiograph with increased lung markings bilaterally, film is left-sided rotated with prominent hilar vasculature on the right.   EKG 74 bpm, normal axis, normal intervals, sinus rhythm with PVCs, no significant ST segment or T wave changes.   Patient placed on aggressive diuresis with improvement of her symptoms. Continue to be very weak and deconditioned, plan to transfer to SNF.    Subjective: 7/7 A/O x4.  No complaints   Assessment & Plan: Covid vaccination; vaccinated 2/3   Active Problems:   Anxiety disorder   Chronic pain   Essential hypertension   Peripheral vascular disease (HCC)   Acute on chronic diastolic CHF (congestive heart failure) (HCC)   Pulmonary hypertension (HCC)   PVD (peripheral vascular disease) (HCC)   Dyspnea on exertion   Anxiety   DJD (degenerative joint disease)  Acute on chronic diastolic CHF, severe chronic pulmonary HTN.  -Strict in and out -3.4  L - Daily weight Filed Weights   12/02/20 0256 12/03/20 0408 12/04/20 0301  Weight: 74.4 kg 76.4 kg 75.5 kg   - Orthostatic vitals q shift: Patient is preload dependent -Benazepril 40 mg daily - Lasix 40 mg daily - Metoprolol 50 mg daily  Essential HTN - See CHF  PVD - Raloxifene 60 mg daily  DOE - 7/6 obtain ambulatory SPO2 SATURATION QUALIFICATIONS: (This note is used to comply with regulatory documentation for home oxygen) Patient Saturations on Room Air at Rest = 96% Patient Saturations on Room Air while Ambulating = 95% Patient Saturations on 0 Liters of oxygen while Ambulating = n/a% Please briefly explain why patient needs home oxygen: -Patient does not meet criteria for home O2   CKD stage IIIb (baseline  Cr ~1.2) Lab Results  Component Value Date   CREATININE 1.16 (H) 12/04/2020   CREATININE 1.13 (H) 12/03/2020   CREATININE 1.07 (H) 12/03/2020   CREATININE 1.22 (H) 12/02/2020   CREATININE 1.25 (H) 12/01/2020  -Better than baseline.  Anxiety - Diazepam 5 mg BID PRN  DJD -Pain control with Tylenol and hydrocodone.   Goals of care - Awaiting SNF placement plan to transfer to SNF     DVT prophylaxis: Lovenox Code Status: Full Family Communication: 7/7 daughter at bedside for discussion plan of care all questions answered Status is: Inpatient   Remains inpatient appropriate because:Inpatient level of care appropriate due to severity of illness   Dispo: The patient is from: Home              Anticipated d/c is to: [facility]  Patient currently is stable for discharge.              Difficult to place patient No      Consultants:    Procedures/Significant Events:  7/4 echocardiogram:Left Ventricle: LVEF 50 to 55%. The left ventricle has low normal function.  Left ventricular diastolic parameters are indeterminate.  -Elevated left atrial pressure.  -Right Ventricle: severely elevated pulmonary artery systolic pressure.  the   estimated right ventricular systolic pressure is 65.8 mmHg.  Left Atrium: severely dilated.     I have personally reviewed and interpreted all radiology studies and my findings are as above.  VENTILATOR SETTINGS:    Cultures   Antimicrobials:    Devices    LINES / TUBES:      Continuous Infusions:   Objective: Vitals:   12/04/20 0512 12/04/20 0845 12/04/20 1537 12/04/20 1539  BP: (!) 143/57 (!) 135/52 (!) 147/118 (!) 163/58  Pulse: 62 65 68 64  Resp: 20     Temp: 97.6 F (36.4 C) 98 F (36.7 C)    TempSrc: Oral Oral    SpO2: 93% 96% 99% 99%  Weight:      Height:        Intake/Output Summary (Last 24 hours) at 12/04/2020 2032 Last data filed at 12/04/2020 1756 Gross per 24 hour  Intake 663 ml  Output 800 ml  Net -137 ml    Filed Weights   12/02/20 0256 12/03/20 0408 12/04/20 0301  Weight: 74.4 kg 76.4 kg 75.5 kg    Examination:  General: A/O x4, positive acute respiratory distress Eyes: negative scleral hemorrhage, negative anisocoria, negative icterus ENT: Negative Runny nose, negative gingival bleeding, Neck:  Negative scars, masses, torticollis, lymphadenopathy, JVD Lungs: tachypneic, clear to auscultation bilaterally without wheezes or crackles Cardiovascular: Regular rate and rhythm without murmur gallop or rub normal S1 and S2 Abdomen: negative abdominal pain, nondistended, positive soft, bowel sounds, no rebound, no ascites, no appreciable mass Extremities: No significant cyanosis, clubbing, or edema bilateral lower extremities Skin: Negative rashes, lesions, ulcers Psychiatric:  Negative depression, negative anxiety, negative fatigue, negative mania  Central nervous system:  Cranial nerves II through XII intact, tongue/uvula midline, all extremities muscle strength 5/5, sensation intact throughout, negative dysarthria, negative expressive aphasia, negative receptive aphasia.  .     Data Reviewed: Care during the described time interval  was provided by me .  I have reviewed this patient's available data, including medical history, events of note, physical examination, and all test results as part of my evaluation.  CBC: Recent Labs  Lab 11/30/20 1130 12/01/20 0235 12/03/20 1007 12/04/20 0232  WBC 8.3 9.6 8.2 8.7  NEUTROABS 6.5  --  5.7 5.8  HGB 12.3 12.7 11.7* 11.4*  HCT 37.9 38.3 35.7* 34.1*  MCV 96.2 96.0 96.2 96.3  PLT 272 265 216 219    Basic Metabolic Panel: Recent Labs  Lab 11/30/20 2006 12/01/20 0235 12/02/20 0245 12/03/20 0317 12/03/20 1007 12/04/20 0232  NA  --  137 139 139 139 138  K  --  3.9 3.5 3.8 3.9 3.6  CL  --  104 101 100 103 101  CO2  --  26 29 28 29  32  GLUCOSE  --  105* 97 83 105* 84  BUN  --  29* 30* 30* 27* 31*  CREATININE  --  1.25* 1.22* 1.07* 1.13* 1.16*  CALCIUM  --  8.9 8.9 8.3* 8.4* 8.0*  MG 1.7  --   --   --  1.7 1.8  PHOS  --   --   --   --  3.0 3.2    GFR: Estimated Creatinine Clearance: 27.6 mL/min (A) (by C-G formula based on SCr of 1.16 mg/dL (H)). Liver Function Tests: Recent Labs  Lab 12/03/20 1007 12/04/20 0232  AST 31 27  ALT 16 15  ALKPHOS 120 109  BILITOT 0.7 0.8  PROT 5.6* 5.0*  ALBUMIN 2.5* 2.2*    No results for input(s): LIPASE, AMYLASE in the last 168 hours. No results for input(s): AMMONIA in the last 168 hours. Coagulation Profile: No results for input(s): INR, PROTIME in the last 168 hours. Cardiac Enzymes: No results for input(s): CKTOTAL, CKMB, CKMBINDEX, TROPONINI in the last 168 hours. BNP (last 3 results) No results for input(s): PROBNP in the last 8760 hours. HbA1C: No results for input(s): HGBA1C in the last 72 hours. CBG: No results for input(s): GLUCAP in the last 168 hours. Lipid Profile: No results for input(s): CHOL, HDL, LDLCALC, TRIG, CHOLHDL, LDLDIRECT in the last 72 hours. Thyroid Function Tests: No results for input(s): TSH, T4TOTAL, FREET4, T3FREE, THYROIDAB in the last 72 hours. Anemia Panel: Recent Labs     12/04/20 0232  VITAMINB12 1,669*  FOLATE 21.7  FERRITIN 79  TIBC 207*  IRON 31  RETICCTPCT 2.5    Sepsis Labs: No results for input(s): PROCALCITON, LATICACIDVEN in the last 168 hours.  Recent Results (from the past 240 hour(s))  Resp Panel by RT-PCR (Flu A&B, Covid) Nasopharyngeal Swab     Status: None   Collection Time: 11/30/20 12:18 PM   Specimen: Nasopharyngeal Swab; Nasopharyngeal(NP) swabs in vial transport medium  Result Value Ref Range Status   SARS Coronavirus 2 by RT PCR NEGATIVE NEGATIVE Final    Comment: (NOTE) SARS-CoV-2 target nucleic acids are NOT DETECTED.  The SARS-CoV-2 RNA is generally detectable in upper respiratory specimens during the acute phase of infection. The lowest concentration of SARS-CoV-2 viral copies this assay can detect is 138 copies/mL. A negative result does not preclude SARS-Cov-2 infection and should not be used as the sole basis for treatment or other patient management decisions. A negative result may occur with  improper specimen collection/handling, submission of specimen other than nasopharyngeal swab, presence of viral mutation(s) within the areas targeted by this assay, and inadequate number of viral copies(<138 copies/mL). A negative result must be combined with clinical observations, patient history, and epidemiological information. The expected result is Negative.  Fact Sheet for Patients:  BloggerCourse.com  Fact Sheet for Healthcare Providers:  SeriousBroker.it  This test is no t yet approved or cleared by the [place] FDA and  has been authorized for detection and/or diagnosis of SARS-CoV-2 by FDA under an Emergency Use Authorization (EUA). This EUA will remain  in effect (meaning this test can be used) for the duration of the COVID-19 declaration under Section 564(b)(1) of the Act, 21 U.S.C.section 360bbb-3(b)(1), unless the authorization is terminated  or revoked  sooner.       Influenza A by PCR NEGATIVE NEGATIVE Final   Influenza B by PCR NEGATIVE NEGATIVE Final    Comment: (NOTE) The Xpert Xpress SARS-CoV-2/FLU/RSV plus assay is intended as an aid in the diagnosis of influenza from Nasopharyngeal swab specimens and should not be used as a sole basis for treatment. Nasal washings and aspirates are unacceptable for Xpert Xpress SARS-CoV-2/FLU/RSV testing.  Fact Sheet for Patients: BloggerCourse.com  Fact Sheet for Healthcare Providers: SeriousBroker.it  This test is not yet approved or cleared by the  [place] Fut[employer] trader and has been authorized for detection and/or diagnosis of SARS-CoV-2 by FDA under an TEFL teacher (EUA). This EUA will remain in effect (meaning this test can be used) for the duration of the COVID-19 declaration under Section 564(b)(1) of the Act, 21 U.S.C. section 360bbb-3(b)(1), unless the authorization is terminated or revoked.  Performed at [facility], [address] [place], [place] [address]   Resp Panel by RT-PCR (Flu A&B, Covid) Nasopharyngeal Swab     Status: None   Collection Time: 12/04/20  3:55 PM   Specimen: Nasopharyngeal Swab; Nasopharyngeal(NP) swabs in vial transport medium  Result Value Ref Range Status   SARS Coronavirus 2 by RT PCR NEGATIVE NEGATIVE Final    Comment: (NOTE) SARS-CoV-2 target nucleic acids are NOT DETECTED.  The SARS-CoV-2 RNA is generally detectable in upper respiratory specimens during the acute phase of infection. The lowest concentration of SARS-CoV-2 viral copies this assay can detect is 138 copies/mL. A negative result does not preclude SARS-Cov-2 infection and should not be used as the sole basis for treatment or other patient management decisions. A negative result may occur with  improper specimen collection/handling, submission of specimen other than nasopharyngeal swab, presence of  viral mutation(s) within the areas targeted by this assay, and inadequate number of viral copies(<138 copies/mL). A negative result must be combined with clinical observations, patient history, and epidemiological information. The expected result is Negative.  Fact Sheet for Patients:  BloggerCourse.com  Fact Sheet for Healthcare Providers:  SeriousBroker.it  This test is no t yet approved or cleared by the [place] FDA and  has been authorized for detection and/or diagnosis of SARS-CoV-2 by FDA under an Emergency Use Authorization (EUA). This EUA will remain  in effect (meaning this test can be used) for the duration of the COVID-19 declaration under Section 564(b)(1) of the Act, 21 U.S.C.section 360bbb-3(b)(1), unless the authorization is terminated  or revoked sooner.       Influenza A by PCR NEGATIVE NEGATIVE Final   Influenza B by PCR NEGATIVE NEGATIVE Final    Comment: (NOTE) The Xpert Xpress SARS-CoV-2/FLU/RSV plus assay is intended as an aid in the diagnosis of influenza from Nasopharyngeal swab specimens and should not be used as a sole basis for treatment. Nasal washings and aspirates are unacceptable for Xpert Xpress SARS-CoV-2/FLU/RSV testing.  Fact Sheet for Patients: BloggerCourse.com  Fact Sheet for Healthcare Providers: SeriousBroker.it  This test is not yet approved or cleared by the [place] FDA and has been authorized for detection and/or diagnosis of SARS-CoV-2 by FDA under an Emergency Use Authorization (EUA). This EUA will remain in effect (meaning this test can be used) for the duration of the COVID-19 declaration under Section 564(b)(1) of the Act, 21 U.S.C. section 360bbb-3(b)(1), unless the authorization is terminated or revoked.  Performed at [facility], [address] [place], [place] [address]           Radiology  Studies: No results found.      Scheduled Meds:  aspirin EC  81 mg Oral Daily   benazepril  40 mg Oral Daily   enoxaparin (LOVENOX) injection  30 mg Subcutaneous Q24H   furosemide  40 mg Oral Daily   metoprolol succinate  50 mg Oral Daily   multivitamin with minerals  1 tablet Oral Daily   raloxifene  60 mg Oral Daily   sodium chloride flush  3 mL Intravenous Q12H   sorbitol, milk of mag, mineral oil, glycerin (SMOG)  enema  400 mL Rectal Once   Continuous Infusions:   LOS: 4 days    Time spent:40 min    Ladaja Yusupov, Roselind MessierURTIS J, MD [facility]   If 7PM-7AM, please contact night-coverage 12/04/2020, 8:32 PM

## 2020-12-04 NOTE — TOC Progression Note (Addendum)
Transition of Care [facility]) - Progression Note    Patient Details  Name: LIAHNA BRICKNER MRN: [id number] Date of Birth: 1923-07-14  [facility] of [facility] CM/SW Contact  Ivette Loyal, [place] Phone Number: 12/04/2020, 10:23 AM  Clinical Narrative:    1023: CSW attempted to contact pt daughter Dois Davenport, no answer CSW left a VM.  1300: CSW spoke with pt daughter about SNF placement and wanting [facility] as preferred SNF and asked about [facility]. CSW contacted [facility], they are reviewing pt information to make and will follow up with CSW for availability.  1330: [facility] has bed available, CSW started [place] [facility]. CSW contacted pt daughter to inform her of bed offer, she accepted. Pt [facility] tomorrow.   Expected Discharge Plan: Skilled Nursing Facility Barriers to Discharge: Continued Medical Work up  Expected Discharge Plan and Services Expected Discharge Plan: Skilled Nursing Facility   Discharge Planning Services: CM Consult Post Acute Care Choice: Skilled Nursing Facility Living arrangements for the past 2 months: Single Family Home                   DME Agency: NA       HH Arranged: NA           Social Determinants of Health (SDOH) Interventions    Readmission Risk Interventions No flowsheet data found.

## 2020-12-05 ENCOUNTER — Other Ambulatory Visit (HOSPITAL_COMMUNITY): Payer: Self-pay

## 2020-12-05 ENCOUNTER — Inpatient Hospital Stay (HOSPITAL_COMMUNITY): Payer: Medicare PPO

## 2020-12-05 DIAGNOSIS — R1013 Epigastric pain: Secondary | ICD-10-CM

## 2020-12-05 LAB — COMPREHENSIVE METABOLIC PANEL
ALT: 19 U/L (ref 0–44)
AST: 38 U/L (ref 15–41)
Albumin: 2.6 g/dL — ABNORMAL LOW (ref 3.5–5.0)
Alkaline Phosphatase: 126 U/L (ref 38–126)
Anion gap: 7 (ref 5–15)
BUN: 35 mg/dL — ABNORMAL HIGH (ref 8–23)
CO2: 30 mmol/L (ref 22–32)
Calcium: 8.5 mg/dL — ABNORMAL LOW (ref 8.9–10.3)
Chloride: 102 mmol/L (ref 98–111)
Creatinine, Ser: 1.24 mg/dL — ABNORMAL HIGH (ref 0.44–1.00)
GFR, Estimated: 40 mL/min — ABNORMAL LOW (ref >60)
Glucose, Bld: 117 mg/dL — ABNORMAL HIGH (ref 70–99)
Potassium: 4.3 mmol/L (ref 3.5–5.1)
Sodium: 139 mmol/L (ref 135–145)
Total Bilirubin: 0.8 mg/dL (ref 0.3–1.2)
Total Protein: 5.8 g/dL — ABNORMAL LOW (ref 6.5–8.1)

## 2020-12-05 LAB — TYPE AND SCREEN
ABO/RH(D): A POS
Antibody Screen: NEGATIVE

## 2020-12-05 LAB — CBC WITH DIFFERENTIAL/PLATELET
Abs Immature Granulocytes: 0.03 10*3/uL (ref 0.00–0.07)
Basophils Absolute: 0 10*3/uL (ref 0.0–0.1)
Basophils Relative: 1 %
Eosinophils Absolute: 0.2 10*3/uL (ref 0.0–0.5)
Eosinophils Relative: 2 %
HCT: 40 % (ref 36.0–46.0)
Hemoglobin: 13.2 g/dL (ref 12.0–15.0)
Immature Granulocytes: 0 %
Lymphocytes Relative: 15 %
Lymphs Abs: 1.3 10*3/uL (ref 0.7–4.0)
MCH: 32 pg (ref 26.0–34.0)
MCHC: 33 g/dL (ref 30.0–36.0)
MCV: 97.1 fL (ref 80.0–100.0)
Monocytes Absolute: 0.4 10*3/uL (ref 0.1–1.0)
Monocytes Relative: 5 %
Neutro Abs: 6.3 10*3/uL (ref 1.7–7.7)
Neutrophils Relative %: 77 %
Platelets: 177 10*3/uL (ref 150–400)
RBC: 4.12 MIL/uL (ref 3.87–5.11)
RDW: 14.6 % (ref 11.5–15.5)
WBC: 8.2 10*3/uL (ref 4.0–10.5)
nRBC: 0 % (ref 0.0–0.2)

## 2020-12-05 LAB — MAGNESIUM: Magnesium: 2 mg/dL (ref 1.7–2.4)

## 2020-12-05 LAB — PHOSPHORUS: Phosphorus: 3.5 mg/dL (ref 2.5–4.6)

## 2020-12-05 LAB — ABO/RH: ABO/RH(D): A POS

## 2020-12-05 MED ORDER — POLYETHYLENE GLYCOL 3350 17 GM/SCOOP PO POWD
17.0000 g | Freq: Every day | ORAL | 0 refills | Status: DC | PRN
  Filled 2020-12-05: qty 14, 14d supply, fill #0

## 2020-12-05 MED ORDER — ASPIRIN 81 MG PO TBEC
81.0000 mg | DELAYED_RELEASE_TABLET | Freq: Every day | ORAL | 0 refills | Status: DC
  Filled 2020-12-05: qty 30, 30d supply, fill #0

## 2020-12-05 MED ORDER — MORPHINE SULFATE (PF) 2 MG/ML IV SOLN
0.5000 mg | Freq: Once | INTRAVENOUS | Status: DC
Start: 2020-12-05 — End: 2020-12-06

## 2020-12-05 MED ORDER — ACETAMINOPHEN 325 MG PO TABS
650.0000 mg | ORAL_TABLET | Freq: Four times a day (QID) | ORAL | 0 refills | Status: DC | PRN
  Filled 2020-12-05: qty 30, 4d supply, fill #0

## 2020-12-05 MED ORDER — FUROSEMIDE 40 MG PO TABS
40.0000 mg | ORAL_TABLET | Freq: Every day | ORAL | 0 refills | Status: DC
  Filled 2020-12-05: qty 30, 30d supply, fill #0

## 2020-12-05 MED ORDER — DIAZEPAM 5 MG PO TABS
5.0000 mg | ORAL_TABLET | Freq: Two times a day (BID) | ORAL | 0 refills | Status: DC | PRN
Start: 2020-12-05 — End: 2020-12-19

## 2020-12-05 MED ORDER — MECLIZINE HCL 12.5 MG PO TABS
12.5000 mg | ORAL_TABLET | Freq: Two times a day (BID) | ORAL | 0 refills | Status: DC | PRN
  Filled 2020-12-05: qty 30, 15d supply, fill #0

## 2020-12-05 MED ORDER — MORPHINE SULFATE (PF) 2 MG/ML IV SOLN
1.0000 mg | Freq: Once | INTRAVENOUS | Status: AC
  Administered 2020-12-05: 1 mg via INTRAVENOUS
  Filled 2020-12-05: qty 1

## 2020-12-05 NOTE — Care Management Important Message (Signed)
Important Message  Patient Details  Name: Susan Pitts MRN: [id number] Date of Birth: [birth year]   Medicare Important Message Given:  Yes     Renie Ora 12/05/2020, 7:47 AM

## 2020-12-05 NOTE — Discharge Summary (Signed)
Physician Discharge Summary  Susan Pitts ZOX:[id number] DOB: [birth year] DOA: 11/30/2020  PCP: Jarome Matin, MD  Admit date: 11/30/2020 Discharge date: 12/05/2020  Time spent: 35 minutes  Recommendations for Outpatient Follow-up:   Acute on chronic diastolic CHF, severe chronic pulmonary HTN.  -Strict in and out -3.4 L - Daily weight Filed Weights   12/02/20 0256 12/03/20 0408 12/04/20 0301  Weight: 74.4 kg 76.4 kg 75.5 kg    - Orthostatic vitals q shift: Patient is preload dependent -Benazepril 40 mg daily - Lasix 40 mg daily - Metoprolol 50 mg daily  Essential HTN - See CHF   PVD - Raloxifene 60 mg daily   DOE - 7/6 obtain ambulatory SPO2 SATURATION QUALIFICATIONS: (This note is used to comply with regulatory documentation for home oxygen) Patient Saturations on Room Air at Rest = 96% Patient Saturations on [employer] while Ambulating = 95% Patient Saturations on 0 Liters of oxygen while Ambulating = n/a% Please briefly explain why patient needs home oxygen: -Patient does not meet criteria for home O2   CKD stage IIIb (baseline  Cr ~1.2) Lab Results  Component Value Date   CREATININE 1.24 (H) 12/05/2020   CREATININE 1.16 (H) 12/04/2020   CREATININE 1.13 (H) 12/03/2020   CREATININE 1.07 (H) 12/03/2020   CREATININE 1.22 (H) 12/02/2020  -At baseline  Anxiety - Diazepam 5 mg BID PRN   DJD -Pain control with Tylenol and hydrocodone.   Discharge Diagnoses:  Active Problems:   Anxiety disorder   Chronic pain   Essential hypertension   Peripheral vascular disease (HCC)   Acute on chronic diastolic CHF (congestive heart failure) (HCC)   Pulmonary hypertension (HCC)   PVD (peripheral vascular disease) (HCC)   Dyspnea on exertion   Anxiety   DJD (degenerative joint disease)   Discharge Condition: Stable  Diet recommendation: Heart healthy  Filed Weights   12/02/20 0256 12/03/20 0408 12/04/20 0301  Weight: 74.4 kg 76.4 kg 75.5 kg    History of present  illness:  [name] was admitted to the hospital with the working diagnosis of acute on chronic diastolic heart failure decompensation.   87 yo WF PMHx  Anxiety and DepressionHTN, PVD, Arthritis,     Presented with dyspnea and lower extremity edema.  Reported 3 weeks of worsening dyspnea, associated lower extremity edema but no chest pain.  On her initial physical examination blood pressure 178/71, heart rate 61, respiratory rate 23, temperature 97.6, oxygen saturation 94%.  She had dry mucous membranes, her lungs were clear to auscultation bilaterally, heart S1-S2, present, rhythmic, positive lower extremity edema.   Sodium 141, potassium 4.5, chloride 105, bicarb 25, glucose 108, BUN 31, creatinine 1.09, BNP 1310, high sensitive troponin 40-47-61, white count 8.3, hemoglobin 12.3, hematocrit 37.9, platelets 272. SARS COVID-19 negative.   Urinalysis specific gravity 1.009.   Chest radiograph with increased lung markings bilaterally, film is left-sided rotated with prominent hilar vasculature on the right.   EKG 74 bpm, normal axis, normal intervals, sinus rhythm with PVCs, no significant ST segment or T wave changes.   Patient placed on aggressive diuresis with improvement of her symptoms. Continue to be very weak and deconditioned, plan to transfer to SNF.   Hospital Course:  See above  Procedures: 7/4 echocardiogram:Left Ventricle: LVEF 50 to 55%. The left ventricle has low normal function.  Left ventricular diastolic parameters are indeterminate. -Elevated left atrial pressure.  -Right Ventricle: severely elevated pulmonary artery systolic pressure.  the  estimated right ventricular systolic pressure is  65.8 mmHg.  Left Atrium: severely dilated.   Cultures  7/3 SARS coronavirus negative 7/3 influenza A/B negative 7/7 SARS coronavirus negative 7/7 influenza A/B negative    Discharge Exam: Vitals:   12/04/20 1539 12/04/20 2101 12/05/20 0415 12/05/20 0749  BP: (!) 163/58 (!)  139/47 (!) 111/47 (!) 133/51  Pulse: 64 62 (!) 53 63  Resp:  20 18 16   Temp:  (!) 97.5 F (36.4 C) (!) 97.5 F (36.4 C) 97.6 F (36.4 C)  TempSrc:  Oral Oral Oral  SpO2: 99% 93% 96% 94%  Weight:      Height:        General: A/O x4, positive acute respiratory distress Eyes: negative scleral hemorrhage, negative anisocoria, negative icterus ENT: Negative Runny nose, negative gingival bleeding, Neck:  Negative scars, masses, torticollis, lymphadenopathy, JVD Lungs: tachypneic, clear to auscultation bilaterally without wheezes or crackles Cardiovascular: Regular rate and rhythm without murmur gallop or rub normal S1 and S2  Discharge Instructions   Allergies as of 12/05/2020       Reactions   Cardura [doxazosin Mesylate] Other (See Comments)   UNK reaction   Cardura [doxazosin]    Other reaction(s): Unknown   Codeine Other (See Comments)   UNK reaction   Fosamax [alendronate]    Other reaction(s): gastritis   Inderal [propranolol] Other (See Comments)   UNK reaction   Penicillins Rash        Medication List     STOP taking these medications    aspirin 81 MG tablet Replaced by: aspirin 81 MG EC tablet   potassium gluconate 595 (99 K) MG Tabs tablet       TAKE these medications    acetaminophen 325 MG tablet Commonly known as: TYLENOL Take 2 tablets (650 mg total) by mouth every 6 (six) hours as needed for mild pain (or Fever >/= 101).   aspirin 81 MG EC tablet Take 1 tablet (81 mg total) by mouth daily. Swallow whole. Replaces: aspirin 81 MG tablet   benazepril 40 MG tablet Commonly known as: LOTENSIN Take 40 mg by mouth daily.   Blue-Emu Super Strength Crea Apply 1 application topically daily as needed (joint pain).   CALTRATE 600+D PO Take 1 tablet by mouth daily.   diazepam 5 MG tablet Commonly known as: VALIUM Take 1 tablet (5 mg total) by mouth 2 (two) times daily as needed for anxiety, sedation or muscle spasms. What changed:  when to take  this reasons to take this   furosemide 40 MG tablet Commonly known as: LASIX Take 1 tablet (40 mg total) by mouth daily. What changed:  medication strength how much to take   HYDROcodone-acetaminophen 5-325 MG tablet Commonly known as: NORCO/VICODIN Take 1 tablet by mouth every 6 (six) hours as needed for moderate pain.   Lidocaine HCl 4 % Crea apply to painful wrist and fingers daily as needed   loratadine 10 MG tablet Commonly known as: CLARITIN Take 10 mg by mouth daily as needed for allergies.   meclizine 12.5 MG tablet Commonly known as: ANTIVERT Take 1 tablet (12.5 mg total) by mouth 2 (two) times daily as needed for dizziness. What changed:  medication strength how much to take when to take this   metoprolol succinate 50 MG 24 hr tablet Commonly known as: TOPROL-XL Take 50 mg by mouth daily.   multivitamin capsule Take 1 capsule by mouth daily.   polyethylene glycol 17 g packet Commonly known as: MIRALAX / GLYCOLAX Take 17 g by  mouth daily as needed for mild constipation.   raloxifene 60 MG tablet Commonly known as: EVISTA Take 60 mg by mouth daily.   vitamin B-12 500 MCG tablet Commonly known as: CYANOCOBALAMIN Take 500 mcg by mouth daily.   VITAMIN D-3 PO Take 2,000 Units by mouth daily.       Allergies  Allergen Reactions   Cardura [Doxazosin Mesylate] Other (See Comments)    UNK reaction   Cardura [Doxazosin]     Other reaction(s): Unknown   Codeine Other (See Comments)    UNK reaction   Fosamax [Alendronate]     Other reaction(s): gastritis   Inderal [Propranolol] Other (See Comments)    UNK reaction   Penicillins Rash      The results of significant diagnostics from this hospitalization (including imaging, microbiology, ancillary and laboratory) are listed below for reference.    Significant Diagnostic Studies: DG Chest Port 1 View  Result Date: 11/30/2020 CLINICAL DATA:  87 year old with shortness of breath. EXAM: PORTABLE CHEST  1 VIEW COMPARISON:  None. FINDINGS: Single view of the chest demonstrates prominent lung and central vascular structures. Low lung volumes. Heart size is within normal limits. Negative for pneumothorax. Atherosclerotic calcifications at the aortic arch. No acute bone abnormality. IMPRESSION: Slightly prominent lung and vascular markings. Findings could represent chronic changes versus mild edema. No focal airspace disease or lung consolidation. Electronically Signed   By: Richarda OverlieAdam  Henn M.D.   On: 11/30/2020 12:12   DG Abd Portable 1V  Result Date: 12/05/2020 CLINICAL DATA:  87 year old female with history of abdominal pain. EXAM: PORTABLE ABDOMEN - 1 VIEW COMPARISON:  No priors. FINDINGS: No pathologic dilatation of small bowel or colon. Several nondilated loops of gas-filled small bowel are noted in the low abdomen and central anatomic pelvis. Gas is noted in the cecum, transverse colon and rectum. No radio-opaque calculi or other significant radiographic abnormality are seen. IMPRESSION: 1. Nonspecific, nonobstructive bowel gas pattern, as above. 2. No pneumoperitoneum. Electronically Signed   By: Trudie Reedaniel  Entrikin M.D.   On: 12/05/2020 07:12   ECHOCARDIOGRAM COMPLETE  Result Date: 12/01/2020    ECHOCARDIOGRAM REPORT   Patient Name:   Brenton GrillsOPAL M Elliff Date of Exam: 12/01/2020 Medical Rec #:  [id number]    Height:       62.0 in Accession #:    [id number]   Weight:       154.3 lb Date of Birth:  1923/11/25    BSA:          1.712 m Patient Age:    87 years     BP:           130/56 mmHg Patient Gender: F            HR:           84 bpm. Exam Location:  Inpatient Procedure: 2D Echo, Cardiac Doppler and Color Doppler Indications:    R06.02 SOB  History:        Patient has no prior history of Echocardiogram examinations.  Sonographer:    Roosvelt Maserachel Lane RDCS Referring Phys: [id number] Cecille PoLEXANDER B MELVIN IMPRESSIONS  1. Left ventricular ejection fraction, by estimation, is 50 to 55%. The left ventricle has low normal function. The  left ventricle has no regional wall motion abnormalities. There is mild concentric left ventricular hypertrophy. Left ventricular diastolic parameters are indeterminate. Elevated left atrial pressure.  2. Right ventricular systolic function is normal. The right ventricular size is normal. There is severely elevated pulmonary artery systolic  pressure. The estimated right ventricular systolic pressure is 65.8 mmHg.  3. Left atrial size was severely dilated.  4. The mitral valve is grossly normal. Mild mitral valve regurgitation.  5. Tricuspid valve regurgitation is mild to moderate.  6. The aortic valve was not well visualized. Aortic valve regurgitation is mild to moderate. No aortic stenosis is present.  7. The inferior vena cava is normal in size with <50% respiratory variability, suggesting right atrial pressure of 8 mmHg. Comparison(s): No prior Echocardiogram. Conclusion(s)/Recommendation(s): Notable ectopy during valvular regurgitation and diastology assessment. FINDINGS  Left Ventricle: Left ventricular ejection fraction, by estimation, is 50 to 55%. The left ventricle has low normal function. The left ventricle has no regional wall motion abnormalities. The left ventricular internal cavity size was normal in size. There is mild concentric left ventricular hypertrophy. Left ventricular diastolic parameters are indeterminate. Elevated left atrial pressure. Right Ventricle: The right ventricular size is normal. No increase in right ventricular wall thickness. Right ventricular systolic function is normal. There is severely elevated pulmonary artery systolic pressure. The tricuspid regurgitant velocity is 3.80 m/s, and with an assumed right atrial pressure of 8 mmHg, the estimated right ventricular systolic pressure is 65.8 mmHg. Left Atrium: Left atrial size was severely dilated. Right Atrium: Right atrial size was normal in size. Pericardium: There is no evidence of pericardial effusion. Mitral Valve: The  mitral valve is grossly normal. Mild mitral valve regurgitation. Tricuspid Valve: The tricuspid valve is normal in structure. Tricuspid valve regurgitation is mild to moderate. No evidence of tricuspid stenosis. Aortic Valve: The aortic valve was not well visualized. Aortic valve regurgitation is mild to moderate. Aortic regurgitation PHT measures 718 msec. No aortic stenosis is present. Aortic valve mean gradient measures 8.0 mmHg. Aortic valve peak gradient measures 15.2 mmHg. Aortic valve area, by VTI measures 1.62 cm. Pulmonic Valve: The pulmonic valve was not well visualized. Pulmonic valve regurgitation is not visualized. No evidence of pulmonic stenosis. Aorta: The aortic root is normal in size and structure. Venous: The inferior vena cava is normal in size with less than 50% respiratory variability, suggesting right atrial pressure of 8 mmHg. IAS/Shunts: The atrial septum is grossly normal.  LEFT VENTRICLE PLAX 2D LVIDd:         4.20 cm  Diastology LVIDs:         3.10 cm  LV e' medial:    3.59 cm/s LV PW:         1.20 cm  LV E/e' medial:  30.4 LV IVS:        1.10 cm  LV e' lateral:   7.72 cm/s LVOT diam:     2.00 cm  LV E/e' lateral: 14.1 LV SV:         81 LV SV Index:   47 LVOT Area:     3.14 cm  RIGHT VENTRICLE RV Basal diam:  3.70 cm LEFT ATRIUM             Index       RIGHT ATRIUM           Index LA diam:        3.40 cm 1.99 cm/m  RA Area:     16.40 cm LA Vol (A2C):   94.8 ml 55.37 ml/m RA Volume:   44.00 ml  25.70 ml/m LA Vol (A4C):   70.2 ml 41.00 ml/m LA Biplane Vol: 85.4 ml 49.88 ml/m  AORTIC VALVE AV Area (Vmax):    1.79 cm AV Area (Vmean):  1.78 cm AV Area (VTI):     1.62 cm AV Vmax:           195.00 cm/s AV Vmean:          131.000 cm/s AV VTI:            0.500 m AV Peak Grad:      15.2 mmHg AV Mean Grad:      8.0 mmHg LVOT Vmax:         111.00 cm/s LVOT Vmean:        74.400 cm/s LVOT VTI:          0.258 m LVOT/AV VTI ratio: 0.52 AI PHT:            718 msec  AORTA Ao Root diam: 3.40 cm  MITRAL VALVE                TRICUSPID VALVE MV Area (PHT): 3.31 cm     TR Peak grad:   57.8 mmHg MV Decel Time: 229 msec     TR Vmax:        380.00 cm/s MV E velocity: 109.00 cm/s MV A velocity: 83.80 cm/s   SHUNTS MV E/A ratio:  1.30         Systemic VTI:  0.26 m                             Systemic Diam: 2.00 cm Riley Lam MD Electronically signed by Riley Lam MD Signature Date/Time: 12/01/2020/5:01:00 PM    Final     Microbiology: Recent Results (from the past 240 hour(s))  Resp Panel by RT-PCR (Flu A&B, Covid) Nasopharyngeal Swab     Status: None   Collection Time: 11/30/20 12:18 PM   Specimen: Nasopharyngeal Swab; Nasopharyngeal(NP) swabs in vial transport medium  Result Value Ref Range Status   SARS Coronavirus 2 by RT PCR NEGATIVE NEGATIVE Final    Comment: (NOTE) SARS-CoV-2 target nucleic acids are NOT DETECTED.  The SARS-CoV-2 RNA is generally detectable in upper respiratory specimens during the acute phase of infection. The lowest concentration of SARS-CoV-2 viral copies this assay can detect is 138 copies/mL. A negative result does not preclude SARS-Cov-2 infection and should not be used as the sole basis for treatment or other patient management decisions. A negative result may occur with  improper specimen collection/handling, submission of specimen other than nasopharyngeal swab, presence of viral mutation(s) within the areas targeted by this assay, and inadequate number of viral copies(<138 copies/mL). A negative result must be combined with clinical observations, patient history, and epidemiological information. The expected result is Negative.  Fact Sheet for Patients:  BloggerCourse.com  Fact Sheet for Healthcare Providers:  SeriousBroker.it  This test is no t yet approved or cleared by the [place] FDA and  has been authorized for detection and/or diagnosis of SARS-CoV-2 by FDA under an  Emergency Use Authorization (EUA). This EUA will remain  in effect (meaning this test can be used) for the duration of the COVID-19 declaration under Section 564(b)(1) of the Act, 21 U.S.C.section 360bbb-3(b)(1), unless the authorization is terminated  or revoked sooner.       Influenza A by PCR NEGATIVE NEGATIVE Final   Influenza B by PCR NEGATIVE NEGATIVE Final    Comment: (NOTE) The Xpert Xpress SARS-CoV-2/FLU/RSV plus assay is intended as an aid in the diagnosis of influenza from Nasopharyngeal swab specimens and should not be used as a sole basis for  treatment. Nasal washings and aspirates are unacceptable for Xpert Xpress SARS-CoV-2/FLU/RSV testing.  Fact Sheet for Patients: BloggerCourse.com  Fact Sheet for Healthcare Providers: SeriousBroker.it  This test is not yet approved or cleared by the [place] FDA and has been authorized for detection and/or diagnosis of SARS-CoV-2 by FDA under an Emergency Use Authorization (EUA). This EUA will remain in effect (meaning this test can be used) for the duration of the COVID-19 declaration under Section 564(b)(1) of the Act, 21 U.S.C. section 360bbb-3(b)(1), unless the authorization is terminated or revoked.  Performed at [facility], [address], [place], [place] [address]   Resp Panel by RT-PCR (Flu A&B, Covid) Nasopharyngeal Swab     Status: None   Collection Time: 12/04/20  3:55 PM   Specimen: Nasopharyngeal Swab; Nasopharyngeal(NP) swabs in vial transport medium  Result Value Ref Range Status   SARS Coronavirus 2 by RT PCR NEGATIVE NEGATIVE Final    Comment: (NOTE) SARS-CoV-2 target nucleic acids are NOT DETECTED.  The SARS-CoV-2 RNA is generally detectable in upper respiratory specimens during the acute phase of infection. The lowest concentration of SARS-CoV-2 viral copies this assay can detect is 138 copies/mL. A negative result does not  preclude SARS-Cov-2 infection and should not be used as the sole basis for treatment or other patient management decisions. A negative result may occur with  improper specimen collection/handling, submission of specimen other than nasopharyngeal swab, presence of viral mutation(s) within the areas targeted by this assay, and inadequate number of viral copies(<138 copies/mL). A negative result must be combined with clinical observations, patient history, and epidemiological information. The expected result is Negative.  Fact Sheet for Patients:  BloggerCourse.com  Fact Sheet for Healthcare Providers:  SeriousBroker.it  This test is no t yet approved or cleared by the [place] FDA and  has been authorized for detection and/or diagnosis of SARS-CoV-2 by FDA under an Emergency Use Authorization (EUA). This EUA will remain  in effect (meaning this test can be used) for the duration of the COVID-19 declaration under Section 564(b)(1) of the Act, 21 U.S.C.section 360bbb-3(b)(1), unless the authorization is terminated  or revoked sooner.       Influenza A by PCR NEGATIVE NEGATIVE Final   Influenza B by PCR NEGATIVE NEGATIVE Final    Comment: (NOTE) The Xpert Xpress SARS-CoV-2/FLU/RSV plus assay is intended as an aid in the diagnosis of influenza from Nasopharyngeal swab specimens and should not be used as a sole basis for treatment. Nasal washings and aspirates are unacceptable for Xpert Xpress SARS-CoV-2/FLU/RSV testing.  Fact Sheet for Patients: BloggerCourse.com  Fact Sheet for Healthcare Providers: SeriousBroker.it  This test is not yet approved or cleared by the [place] FDA and has been authorized for detection and/or diagnosis of SARS-CoV-2 by FDA under an Emergency Use Authorization (EUA). This EUA will remain in effect (meaning this test can be used) for the  duration of the COVID-19 declaration under Section 564(b)(1) of the Act, 21 U.S.C. section 360bbb-3(b)(1), unless the authorization is terminated or revoked.  Performed at [facility], [address] [place], [place] [address]      Labs: Basic Metabolic Panel: Recent Labs  Lab 11/30/20 2006 12/01/20 0235 12/02/20 0245 12/03/20 0317 12/03/20 1007 12/04/20 0232 12/05/20 0432  NA  --    < > 139 139 139 138 139  K  --    < > 3.5 3.8 3.9 3.6 4.3  CL  --    < > 101 100 103  101 102  CO2  --    < > 32 30  GLUCOSE  --    < > 97 83 105* 84 117*  BUN  --    < > 30* 30* 27* 31* 35*  CREATININE  --    < > 1.22* 1.07* 1.13* 1.16* 1.24*  CALCIUM  --    < > 8.9 8.3* 8.4* 8.0* 8.5*  MG 1.7  --   --   --  1.7 1.8 2.0  PHOS  --   --   --   --  3.0 3.2 3.5   < > = values in this interval not displayed.   Liver Function Tests: Recent Labs  Lab 12/03/20 1007 12/04/20 0232 12/05/20 0432  AST 31 27 38  ALT ALKPHOS 120 109 126  BILITOT 0.7 0.8 0.8  PROT 5.6* 5.0* 5.8*  ALBUMIN 2.5* 2.2* 2.6*   No results for input(s): LIPASE, AMYLASE in the last 168 hours. No results for input(s): AMMONIA in the last 168 hours. CBC: Recent Labs  Lab 11/30/20 1130 12/01/20 0235 12/03/20 1007 12/04/20 0232 12/05/20 0432  WBC 8.3 9.6 8.2 8.7 8.2  NEUTROABS 6.5  --  5.7 5.8 6.3  HGB 12.3 12.7 11.7* 11.4* 13.2  HCT 37.9 38.3 35.7* 34.1* 40.0  MCV 96.2 96.0 96.2 96.3 97.1  PLT 272 265 216 219 177   Cardiac Enzymes: No results for input(s): CKTOTAL, CKMB, CKMBINDEX, TROPONINI in the last 168 hours. BNP: BNP (last 3 results) Recent Labs    11/30/20 1130  BNP 1,310.9*    ProBNP (last 3 results) No results for input(s): PROBNP in the last 8760 hours.  CBG: No results for input(s): GLUCAP in the last 168 hours.     Signed:  Carolyne Littles, MD [facility]

## 2020-12-05 NOTE — Progress Notes (Signed)
Occupational Therapy Treatment Patient Details Name: Susan Pitts MRN: [id number] DOB: 01-04-1924 Today's Date: 12/05/2020    History of present illness Pt is a 87 y/o female adm 7/3 with worsening edema and SOB due to acute on chronic diastolic heart failure. PMH - chf, pvd, HTN, anxiety.   OT comments  Pt. Seen for skilled OT treatment session.  Session limited as pt. States she did not sleep well last night.  Declined eob/oob.  Agreeable to bed mobility as pre curser for increasing safety with eob/oob.  Mod/max for pulling up in bed.  Encouragement to assist with use of b ues to pull up in bed.  Pt. Difficult to redirect to desired tasks once talking and/or telling a story.  Note d/c to snf today or over weekend.    Follow Up Recommendations  SNF;Supervision/Assistance - 24 hour    Equipment Recommendations  3 in 1 bedside commode    Recommendations for Other Services      Precautions / Restrictions Precautions Precautions: Fall Precaution Comments: falls at home, incontinence Restrictions Weight Bearing Restrictions: No       Mobility Bed Mobility Overal bed mobility: Needs Assistance             General bed mobility comments: agreeable to bed mobility in preparation for self feeding breakfast in bed.  instructions for use of b ues to pull up along with b les.  pt. max a to pull up with use of bed pads    Transfers                 General transfer comment: pt. declined eob/oob    Balance                                           ADL either performed or assessed with clinical judgement   ADL Overall ADL's : Needs assistance/impaired Eating/Feeding: Minimal assistance;Bed level Eating/Feeding Details (indicate cue type and reason): reports needing assistance                                   General ADL Comments: pt. with cont. confusing, states "im a little confused this morning" session limited as pt. reports she was up  all night after receiving an enema and declined oob.  agreeable to bed mobility and self feeding bed level     Vision       Perception     Praxis      Cognition Arousal/Alertness: Awake/alert Behavior During Therapy: WFL for tasks assessed/performed Overall Cognitive Status: Impaired/Different from baseline Area of Impairment: Orientation;Memory;Attention;Following commands;Safety/judgement;Awareness;Problem solving                   Current Attention Level: Sustained Memory: Decreased short-term memory Following Commands: Follows one step commands with increased time Safety/Judgement: Decreased awareness of safety;Decreased awareness of deficits Awareness: Emergent Problem Solving: Slow processing;Decreased initiation;Difficulty sequencing;Requires verbal cues;Requires tactile cues General Comments: difficult to redirect from stories about he enema from last night        Exercises     Shoulder Instructions       General Comments      Pertinent Vitals/ Pain       Pain Assessment: No/denies pain  Home Living  Prior Functioning/Environment              Frequency  Min 2X/week        Progress Toward Goals  OT Goals(current goals can now be found in the care plan section)  Progress towards OT goals: Progressing toward goals     Plan Discharge plan remains appropriate    Co-evaluation                 AM-PAC OT "6 Clicks" Daily Activity     Outcome Measure   Help from another person eating meals?: A Little Help from another person taking care of personal grooming?: A Little Help from another person toileting, which includes using toliet, bedpan, or urinal?: A Lot Help from another person bathing (including washing, rinsing, drying)?: A Lot Help from another person to put on and taking off regular upper body clothing?: A Little Help from another person to put on and taking off  regular lower body clothing?: A Lot 6 Click Score: 15    End of Session    OT Visit Diagnosis: Unsteadiness on feet (R26.81);Other abnormalities of gait and mobility (R26.89);Muscle weakness (generalized) (M62.81);Other symptoms and signs involving cognitive function   Activity Tolerance     Patient Left in bed;with call bell/phone within reach;with bed alarm set   Nurse Communication          Time: [id number] OT Time Calculation (min): 10 min  Charges: OT General Charges $OT Visit: 1 Visit OT Treatments $Therapeutic Activity: 8-22 mins  Boneta Lucks, COTA/L Acute Rehabilitation [phone]    Alessandra Bevels Lorraine-COTA/L 12/05/2020, 11:13 AM

## 2020-12-05 NOTE — TOC Progression Note (Addendum)
Transition of Care [facility]) - Progression Note    Patient Details  Name: Susan Pitts MRN: [id number] Date of Birth: [birth year]  Transition of Care [facility] CM/SW Contact  Ivette Loyal, [place] Phone Number: 12/05/2020, 9:39 AM  Clinical Narrative:    [phone]: CSW contacted pt daughter for DC updates for today and informed her that Sonny Dandy would be calling this morning for pt paperwork.   Pt auth approved for 5 days ref# [id number]   Expected Discharge Plan: Skilled Nursing Facility Barriers to Discharge: Continued Medical Work up  Expected Discharge Plan and Services Expected Discharge Plan: Skilled Nursing Facility   Discharge Planning Services: CM Consult Post Acute Care Choice: Skilled Nursing Facility Living arrangements for the past 2 months: Single Family Home                   DME Agency: NA       HH Arranged: NA           Social Determinants of Health (SDOH) Interventions    Readmission Risk Interventions No flowsheet data found.

## 2020-12-05 NOTE — Progress Notes (Signed)
12/05/20 0000: Pt reporting severe abdominal cramping 7/10 post SMOG enema. Also having pain around macerated buttocks d/t frequent BMs. PRN hydrocodone given. Meticulous peri-care performed, barrier cream applied. Pt was asleep when rounded on one hour later.   0300: TRN was notified by staff that pt had BM with red streaks @ approx 0230. RN entered and room and pt had c/o severe 9/10 ABD pain. She also was expressing dissatisfaction with after-effects of enema. TRN provided active listening and informed pt that enema was successful and that abd cramping can be a side effect and that staff will do their best to control pt pain. MD paged about continued pain and rectal bleeding. Orders placed for STAT CBC, Type and screen, and 1 mg IV morphine. Pt fell asleep shortly after morphine administration.   3491: Pt called RN into room with severe 9/10 ABD pain similar to what she had been experiencing since SMOG enema. Pt also reporting dissatisfaction over enema and states she "feels sicker then when I first came in". MD paged, orders placed for STAT ABD x-ray and  .5 mg morphine. Pt also requested preparation H suppository, MD said to hold off on ordering until ABD x-ray resulted. Pt refused morphine and was insistent on receiving preparation H suppository. Educated pt on importance of performing x-ray before administering any rectal medications.

## 2020-12-05 NOTE — TOC Transition Note (Signed)
Transition of Care [facility]) - CM/SW Discharge Note   Patient Details  Name: Susan Pitts MRN: [id number] Date of Birth: 1923-06-28  Transition of Care [facility]) CM/SW Contact:  Lynett Grimes Phone Number: 12/05/2020, 9:55 AM   Clinical Narrative:    Patient will DC to: [facility] Anticipated DC date: 12/05/2020 Family notified: Pt daughter  Transport by: Sharin Mons   Per MD patient ready for DC to [facility] 116. RN to call report prior to discharge [phone]). RN, patient, patient's family, and facility notified of DC. Discharge Summary and FL2 sent to facility. DC packet on chart. Ambulance transport will be requested  by Nurse [phone])  CSW will sign off for now as social work intervention is no longer needed. Please consult [place] again if new needs arise.       Barriers to Discharge: Continued Medical Work up   Patient Goals and CMS Choice Patient states their goals for this hospitalization and ongoing recovery are:: SNF CMS Medicare.gov Compare Post Acute Care list provided to:: Patient Choice offered to / list presented to : Patient  Discharge Placement                       Discharge Plan and Services   Discharge Planning Services: CM Consult Post Acute Care Choice: Skilled Nursing Facility            DME Agency: NA       HH Arranged: NA          Social Determinants of Health (SDOH) Interventions     Readmission Risk Interventions No flowsheet data found.

## 2020-12-05 NOTE — Progress Notes (Signed)
D/C instructions printed and placed in packet at nurse's station. Tele and IV removed, tolerated well.  

## 2020-12-05 NOTE — Plan of Care (Signed)

## 2020-12-05 NOTE — Progress Notes (Signed)
Called [facility]. Gave report to RN.

## 2020-12-06 DIAGNOSIS — E441 Mild protein-calorie malnutrition: Secondary | ICD-10-CM | POA: Diagnosis not present

## 2020-12-06 DIAGNOSIS — I10 Essential (primary) hypertension: Secondary | ICD-10-CM | POA: Diagnosis not present

## 2020-12-06 DIAGNOSIS — I272 Pulmonary hypertension, unspecified: Secondary | ICD-10-CM | POA: Diagnosis not present

## 2020-12-06 DIAGNOSIS — I739 Peripheral vascular disease, unspecified: Secondary | ICD-10-CM | POA: Diagnosis not present

## 2020-12-06 DIAGNOSIS — R1312 Dysphagia, oropharyngeal phase: Secondary | ICD-10-CM | POA: Diagnosis not present

## 2020-12-06 DIAGNOSIS — R5381 Other malaise: Secondary | ICD-10-CM | POA: Diagnosis not present

## 2020-12-06 DIAGNOSIS — M6281 Muscle weakness (generalized): Secondary | ICD-10-CM | POA: Diagnosis not present

## 2020-12-06 DIAGNOSIS — Z20818 Contact with and (suspected) exposure to other bacterial communicable diseases: Secondary | ICD-10-CM | POA: Diagnosis not present

## 2020-12-06 DIAGNOSIS — F419 Anxiety disorder, unspecified: Secondary | ICD-10-CM | POA: Diagnosis not present

## 2020-12-06 DIAGNOSIS — I959 Hypotension, unspecified: Secondary | ICD-10-CM | POA: Diagnosis not present

## 2020-12-06 DIAGNOSIS — M158 Other polyosteoarthritis: Secondary | ICD-10-CM | POA: Diagnosis not present

## 2020-12-06 DIAGNOSIS — N1832 Chronic kidney disease, stage 3b: Secondary | ICD-10-CM | POA: Diagnosis not present

## 2020-12-06 DIAGNOSIS — I5033 Acute on chronic diastolic (congestive) heart failure: Secondary | ICD-10-CM | POA: Diagnosis not present

## 2020-12-06 DIAGNOSIS — R0602 Shortness of breath: Secondary | ICD-10-CM | POA: Diagnosis not present

## 2020-12-06 DIAGNOSIS — M810 Age-related osteoporosis without current pathological fracture: Secondary | ICD-10-CM | POA: Diagnosis not present

## 2020-12-06 DIAGNOSIS — I5043 Acute on chronic combined systolic (congestive) and diastolic (congestive) heart failure: Secondary | ICD-10-CM | POA: Diagnosis not present

## 2020-12-06 DIAGNOSIS — Z743 Need for continuous supervision: Secondary | ICD-10-CM | POA: Diagnosis not present

## 2020-12-06 DIAGNOSIS — R2681 Unsteadiness on feet: Secondary | ICD-10-CM | POA: Diagnosis not present

## 2020-12-06 DIAGNOSIS — J069 Acute upper respiratory infection, unspecified: Secondary | ICD-10-CM | POA: Diagnosis not present

## 2020-12-08 ENCOUNTER — Non-Acute Institutional Stay (SKILLED_NURSING_FACILITY): Payer: Medicare PPO | Admitting: Adult Health

## 2020-12-08 ENCOUNTER — Encounter: Payer: Self-pay | Admitting: Adult Health

## 2020-12-08 DIAGNOSIS — M158 Other polyosteoarthritis: Secondary | ICD-10-CM | POA: Diagnosis not present

## 2020-12-08 DIAGNOSIS — I10 Essential (primary) hypertension: Secondary | ICD-10-CM

## 2020-12-08 DIAGNOSIS — I5033 Acute on chronic diastolic (congestive) heart failure: Secondary | ICD-10-CM | POA: Diagnosis not present

## 2020-12-08 DIAGNOSIS — N1832 Chronic kidney disease, stage 3b: Secondary | ICD-10-CM

## 2020-12-08 DIAGNOSIS — F419 Anxiety disorder, unspecified: Secondary | ICD-10-CM | POA: Diagnosis not present

## 2020-12-08 DIAGNOSIS — M810 Age-related osteoporosis without current pathological fracture: Secondary | ICD-10-CM

## 2020-12-08 DIAGNOSIS — R5381 Other malaise: Secondary | ICD-10-CM

## 2020-12-08 NOTE — Progress Notes (Signed)
Location:  [facility] Room Number: 116-A Place of Service:  SNF (31) Provider:  Kenard GowerMedina-Vargas, Affan Callow, DNP, FNP-BC  Patient Care Team: Jarome MatinPaterson, [name], MD as PCP - General (Internal Medicine)  Extended Emergency Contact Information Primary Emergency Contact: Anne NgKelly,Sandra  [place] Home Phone: [phone] Work Phone: [phone] Mobile Phone: [phone] Relation: Daughter  Code Status: Full   Goals of care: Advanced Directive information Advanced Directives 12/08/2020  Does Patient Have a Medical Advance Directive? No  Would patient like information on creating a medical advance directive? No - Patient declined     Chief Complaint  Patient presents with   Hospitalization Follow-up    Hospital follow-up    HPI:  Pt is a 87 y.o. female who [facility] on 12/05/20 post hospital admission 11/30/20 to 12/05/2020.  She has a PMH of anxiety and depression, hypertension, PVD and arthritis.  She was reported to be having worsening dyspnea x3 weeks, associated lower extremity edema but no chest pain. Initial presentation BP was 178/71, HR 61, O2 sat 94%  Chest x-ray showed increased markings bilaterally with prominent hilar vasculature on the right.  Echocardiogram LVEF 50 to 55%.  She was treated in the hospital for acute on chronic diastolic CHF and severe chronic pulmonary hypertension.  She was aggressively diuresed, -3.4 L, with improvement of her symptoms.  [facility] due to deconditioning.  She was seen in her room today. She was asking why her feet were blue. She was doing her sponge bath with OT. No noted bluish discoloration of feet.   Past Medical History:  Diagnosis Date   Arthritis    Constipation    Hypertension    Microscopic hematuria    Nocturia    Postmenopausal atrophic vaginitis    Past Surgical History:  Procedure Laterality Date    ABDOMINAL HYSTERECTOMY     JOINT REPLACEMENT     knee replacement   TONSILLECTOMY      Allergies  Allergen Reactions   Cardura [Doxazosin Mesylate] Other (See Comments)    UNK reaction   Cardura [Doxazosin]     Other reaction(s): Unknown   Codeine Other (See Comments)    UNK reaction   Fosamax [Alendronate]     Other reaction(s): gastritis   Inderal [Propranolol] Other (See Comments)    UNK reaction   Penicillins Rash    Outpatient Encounter Medications as of 12/08/2020  Medication Sig   acetaminophen (TYLENOL) 325 MG tablet Take 2 tablets (650 mg total) by mouth every 6 (six) hours as needed for mild pain (or Fever >/= 101).   aspirin 81 MG EC tablet Take 1 tablet (81 mg total) by mouth daily. Swallow whole.   benazepril (LOTENSIN) 40 MG tablet Take 40 mg by mouth daily.   bisacodyl (DULCOLAX) 10 MG suppository Place 10 mg rectally. If not relieved by MOM, give 10 mg Bisacodyl suppositiory rectally X 1 dose in 24 hours as needed   Calcium Carbonate-Vitamin D (CALTRATE 600+D PO) Take 1 tablet by mouth daily.    Cholecalciferol (VITAMIN D-3 PO) Take 2,000 Units by mouth daily.    diazepam (VALIUM) 5 MG tablet Take 1 tablet (5 mg total) by mouth 2 (two) times daily as needed for anxiety, sedation or muscle spasms.   furosemide (LASIX) 40 MG tablet Take 1 tablet (40 mg total) by mouth daily.   HYDROcodone-acetaminophen (NORCO/VICODIN) 5-325 MG tablet Take 1 tablet by mouth every 6 (  six) hours as needed for moderate pain.   Lidocaine HCl 4 % CREA apply to painful wrist and fingers daily as needed   Liniments (BLUE-EMU SUPER STRENGTH) CREA Apply 1 application topically daily as needed (joint pain).   loratadine (CLARITIN) 10 MG tablet Take 10 mg by mouth daily as needed for allergies.   meclizine (ANTIVERT) 12.5 MG tablet Take 1 tablet (12.5 mg total) by mouth 2 (two) times daily as needed for dizziness.   metoprolol succinate (TOPROL-XL) 50 MG 24 hr tablet Take 50 mg by mouth daily.    Multiple Vitamin (MULTIVITAMIN) capsule Take 1 capsule by mouth daily.   polyethylene glycol powder (GLYCOLAX/MIRALAX) 17 GM/SCOOP powder Take 17 g by mouth daily as needed for mild constipation.   raloxifene (EVISTA) 60 MG tablet Take 60 mg by mouth daily.   Sodium Phosphates (RA SALINE ENEMA RE) Place rectally. If not relieved by Biscodyl suppository, give disposable Saline Enema rectally X 1 dose/24 hrs as needed   vitamin B-12 (CYANOCOBALAMIN) 500 MCG tablet Take 500 mcg by mouth daily.   No facility-administered encounter medications on file as of 12/08/2020.    Review of Systems  GENERAL: No change in appetite, no fatigue, no weight changes, no fever or chills MOUTH and THROAT: Denies oral discomfort, gingival pain or bleeding RESPIRATORY: no cough, SOB, DOE, wheezing, hemoptysis CARDIAC: No chest pain, edema or palpitations GI: No abdominal pain, diarrhea, constipation, heart burn, nausea or vomiting GU: Denies dysuria, frequency incontinence, or discharge NEUROLOGICAL: Denies dizziness, syncope, numbness, or headache PSYCHIATRIC: Denies feelings of depression or anxiety. No report of hallucinations, insomnia, paranoia, or agitation   Immunization History  Administered Date(s) Administered   Fluad Quad(high Dose 65+) 02/09/2019   Influenza Split 05/31/2008, 03/12/2010, 03/03/2011, 02/09/2012, 02/20/2014, 02/12/2020   Influenza, High Dose Seasonal PF 01/23/2018   Pneumococcal Conjugate-13 12/25/2014   Tdap 10/22/2013   Zoster, Live 11/30/2012, 02/08/2013, 09/23/2016   Pertinent  Health Maintenance Due  Topic Date Due   DEXA SCAN  Never done   PNA vac Low Risk Adult (2 of 2 - PPSV23) 12/25/2015   INFLUENZA VACCINE  12/29/2020   No flowsheet data found.   Vitals:   12/08/20 1203  BP: 111/78  Pulse: 80  Resp: 20  Temp: (!) 96.8 F (36 C)  Height:  (1.575 m)   Body mass index is 30.44 kg/m.  Physical Exam  GENERAL APPEARANCE: Well nourished. In no acute  distress. Obese. SKIN:  Bruising on left arm. MOUTH and THROAT: Lips are without lesions. Oral mucosa is moist and without lesions.  RESPIRATORY: Breathing is even & unlabored, BS CTAB CARDIAC: RRR, no murmur,no extra heart sounds, no edema GI: Abdomen soft, normal BS, no masses, no tenderness EXTREMITIES:  Able to move X 4 extremities. NEUROLOGICAL: There is no tremor. Speech is clear. Alert and oriented X 3. PSYCHIATRIC:  slightly anxious  Labs reviewed: Recent Labs    12/03/20 1007 12/04/20 0232 12/05/20 0432  NA 139 138 139  K 3.9 3.6 4.3  CL 103 101 102  CO2 29 32 30  GLUCOSE 105* 84 117*  BUN 27* 31* 35*  CREATININE 1.13* 1.16* 1.24*  CALCIUM 8.4* 8.0* 8.5*  MG 1.7 1.8 2.0  PHOS 3.0 3.2 3.5   Recent Labs    12/03/20 1007 12/04/20 0232 12/05/20 0432  AST 31 27 38  ALT ALKPHOS 120 109 126  BILITOT 0.7 0.8 0.8  PROT 5.6* 5.0* 5.8*  ALBUMIN 2.5* 2.2* 2.6*  Recent Labs    12/03/20 1007 12/04/20 0232 12/05/20 0432  WBC 8.2 8.7 8.2  NEUTROABS 5.7 5.8 6.3  HGB 11.7* 11.4* 13.2  HCT 35.7* 34.1* 40.0  MCV 96.2 96.3 97.1  PLT 216 219 177   No results found for: TSH No results found for: HGBA1C No results found for: CHOL, HDL, LDLCALC, LDLDIRECT, TRIG, CHOLHDL  Significant Diagnostic Results in last 30 days:  DG Chest Port 1 View  Result Date: 11/30/2020 CLINICAL DATA:  87 year old with shortness of breath. EXAM: PORTABLE CHEST 1 VIEW COMPARISON:  None. FINDINGS: Single view of the chest demonstrates prominent lung and central vascular structures. Low lung volumes. Heart size is within normal limits. Negative for pneumothorax. Atherosclerotic calcifications at the aortic arch. No acute bone abnormality. IMPRESSION: Slightly prominent lung and vascular markings. Findings could represent chronic changes versus mild edema. No focal airspace disease or lung consolidation. Electronically Signed   By: Richarda Overlie M.D.   On: 11/30/2020 12:12   DG Abd Portable  1V  Result Date: 12/05/2020 CLINICAL DATA:  87 year old female with history of abdominal pain. EXAM: PORTABLE ABDOMEN - 1 VIEW COMPARISON:  No priors. FINDINGS: No pathologic dilatation of small bowel or colon. Several nondilated loops of gas-filled small bowel are noted in the low abdomen and central anatomic pelvis. Gas is noted in the cecum, transverse colon and rectum. No radio-opaque calculi or other significant radiographic abnormality are seen. IMPRESSION: 1. Nonspecific, nonobstructive bowel gas pattern, as above. 2. No pneumoperitoneum. Electronically Signed   By: Trudie Reed M.D.   On: 12/05/2020 07:12   ECHOCARDIOGRAM COMPLETE  Result Date: 12/01/2020    ECHOCARDIOGRAM REPORT   Patient Name:   Susan Pitts Date of Exam: 12/01/2020 Medical Rec #:  [id number]    Height:       62.0 in Accession #:    [id number]   Weight:       154.3 lb Date of Birth:  07-18-1923    BSA:          1.712 m Patient Age:    87 years     BP:           130/56 mmHg Patient Gender: F            HR:           84 bpm. Exam Location:  Inpatient Procedure: 2D Echo, Cardiac Doppler and Color Doppler Indications:    R06.02 SOB  History:        Patient has no prior history of Echocardiogram examinations.  Sonographer:    Roosvelt Maser RDCS Referring Phys: [id number] Cecille Po MELVIN IMPRESSIONS  1. Left ventricular ejection fraction, by estimation, is 50 to 55%. The left ventricle has low normal function. The left ventricle has no regional wall motion abnormalities. There is mild concentric left ventricular hypertrophy. Left ventricular diastolic parameters are indeterminate. Elevated left atrial pressure.  2. Right ventricular systolic function is normal. The right ventricular size is normal. There is severely elevated pulmonary artery systolic pressure. The estimated right ventricular systolic pressure is 65.8 mmHg.  3. Left atrial size was severely dilated.  4. The mitral valve is grossly normal. Mild mitral valve regurgitation.  5.  Tricuspid valve regurgitation is mild to moderate.  6. The aortic valve was not well visualized. Aortic valve regurgitation is mild to moderate. No aortic stenosis is present.  7. The inferior vena cava is normal in size with <50% respiratory variability, suggesting right atrial pressure of 8 mmHg.  Comparison(s): No prior Echocardiogram. Conclusion(s)/Recommendation(s): Notable ectopy during valvular regurgitation and diastology assessment. FINDINGS  Left Ventricle: Left ventricular ejection fraction, by estimation, is 50 to 55%. The left ventricle has low normal function. The left ventricle has no regional wall motion abnormalities. The left ventricular internal cavity size was normal in size. There is mild concentric left ventricular hypertrophy. Left ventricular diastolic parameters are indeterminate. Elevated left atrial pressure. Right Ventricle: The right ventricular size is normal. No increase in right ventricular wall thickness. Right ventricular systolic function is normal. There is severely elevated pulmonary artery systolic pressure. The tricuspid regurgitant velocity is 3.80 m/s, and with an assumed right atrial pressure of 8 mmHg, the estimated right ventricular systolic pressure is 65.8 mmHg. Left Atrium: Left atrial size was severely dilated. Right Atrium: Right atrial size was normal in size. Pericardium: There is no evidence of pericardial effusion. Mitral Valve: The mitral valve is grossly normal. Mild mitral valve regurgitation. Tricuspid Valve: The tricuspid valve is normal in structure. Tricuspid valve regurgitation is mild to moderate. No evidence of tricuspid stenosis. Aortic Valve: The aortic valve was not well visualized. Aortic valve regurgitation is mild to moderate. Aortic regurgitation PHT measures 718 msec. No aortic stenosis is present. Aortic valve mean gradient measures 8.0 mmHg. Aortic valve peak gradient measures 15.2 mmHg. Aortic valve area, by VTI measures 1.62 cm. Pulmonic  Valve: The pulmonic valve was not well visualized. Pulmonic valve regurgitation is not visualized. No evidence of pulmonic stenosis. Aorta: The aortic root is normal in size and structure. Venous: The inferior vena cava is normal in size with less than 50% respiratory variability, suggesting right atrial pressure of 8 mmHg. IAS/Shunts: The atrial septum is grossly normal.  LEFT VENTRICLE PLAX 2D LVIDd:         4.20 cm  Diastology LVIDs:         3.10 cm  LV e' medial:    3.59 cm/s LV PW:         1.20 cm  LV E/e' medial:  30.4 LV IVS:        1.10 cm  LV e' lateral:   7.72 cm/s LVOT diam:     2.00 cm  LV E/e' lateral: 14.1 LV SV:         81 LV SV Index:   47 LVOT Area:     3.14 cm  RIGHT VENTRICLE RV Basal diam:  3.70 cm LEFT ATRIUM             Index       RIGHT ATRIUM           Index LA diam:        3.40 cm 1.99 cm/m  RA Area:     16.40 cm LA Vol (A2C):   94.8 ml 55.37 ml/m RA Volume:   44.00 ml  25.70 ml/m LA Vol (A4C):   70.2 ml 41.00 ml/m LA Biplane Vol: 85.4 ml 49.88 ml/m  AORTIC VALVE AV Area (Vmax):    1.79 cm AV Area (Vmean):   1.78 cm AV Area (VTI):     1.62 cm AV Vmax:           195.00 cm/s AV Vmean:          131.000 cm/s AV VTI:            0.500 m AV Peak Grad:      15.2 mmHg AV Mean Grad:      8.0 mmHg LVOT Vmax:  111.00 cm/s LVOT Vmean:        74.400 cm/s LVOT VTI:          0.258 m LVOT/AV VTI ratio: 0.52 AI PHT:            718 msec  AORTA Ao Root diam: 3.40 cm MITRAL VALVE                TRICUSPID VALVE MV Area (PHT): 3.31 cm     TR Peak grad:   57.8 mmHg MV Decel Time: 229 msec     TR Vmax:        380.00 cm/s MV E velocity: 109.00 cm/s MV A velocity: 83.80 cm/s   SHUNTS MV E/A ratio:  1.30         Systemic VTI:  0.26 m                             Systemic Diam: 2.00 cm Riley Lam MD Electronically signed by Riley Lam MD Signature Date/Time: 12/01/2020/5:01:00 PM    Final     Assessment/Plan  1. Acute on chronic diastolic CHF (congestive heart failure) (HCC) -   was diuresed -3.4L -   no noted SOB -   continue furosemide 40 mg daily -   weigh daily and notify provider for weight gain of 3 lbs in a day or 5 lbs in a week  2. Essential hypertension   -  BP 111/78, will continue benazepril 40 mg daily and metoprolol succinate ER 50 mg daily  3. Age-related osteoporosis without current pathological fracture -   Continue raloxifene  4. Anxiety -  slightly anxious, continue diazepam 5 mg BID PRN  5. Stage 3b chronic kidney disease (HCC) Lab Results  Component Value Date   NA 139 12/05/2020   K 4.3 12/05/2020   CO2 30 12/05/2020   GLUCOSE 117 (H) 12/05/2020   BUN 35 (H) 12/05/2020   CREATININE 1.24 (H) 12/05/2020   CALCIUM 8.5 (L) 12/05/2020   GFRNONAA 40 (L) 12/05/2020   -   at baseline  6. Other osteoarthritis involving multiple joints -    continue Norco 5-325 mg 1 tab every 6 hours PRN and acetaminophen.  325 mg 2 tabs = 650 mg every 6 hours PRN for pain  7.  Physical deconditioning -   for PT and OT, for therapeutic strengthening exercises     Family/ staff Communication: Discussed plan of care with resident and charge nurse.  Labs/tests ordered: BMP  Goals of care:   Short-term care   Kenard Gower, DNP, MSN, FNP-BC [facility] [phone] (Monday-Friday 8:00 a.m. - 5:00 p.m.) [phone] (after hours)

## 2020-12-09 ENCOUNTER — Non-Acute Institutional Stay (SKILLED_NURSING_FACILITY): Payer: Medicare PPO | Admitting: Internal Medicine

## 2020-12-09 ENCOUNTER — Encounter: Payer: Self-pay | Admitting: Internal Medicine

## 2020-12-09 DIAGNOSIS — N1832 Chronic kidney disease, stage 3b: Secondary | ICD-10-CM | POA: Diagnosis not present

## 2020-12-09 DIAGNOSIS — E441 Mild protein-calorie malnutrition: Secondary | ICD-10-CM | POA: Diagnosis not present

## 2020-12-09 DIAGNOSIS — I10 Essential (primary) hypertension: Secondary | ICD-10-CM

## 2020-12-09 DIAGNOSIS — E46 Unspecified protein-calorie malnutrition: Secondary | ICD-10-CM | POA: Insufficient documentation

## 2020-12-09 DIAGNOSIS — I5033 Acute on chronic diastolic (congestive) heart failure: Secondary | ICD-10-CM | POA: Diagnosis not present

## 2020-12-09 DIAGNOSIS — N183 Chronic kidney disease, stage 3 unspecified: Secondary | ICD-10-CM | POA: Insufficient documentation

## 2020-12-09 NOTE — Assessment & Plan Note (Signed)
Creat 1.24/ GFR 40, CKD Stage 3 b Medication List reviewed; on Benazepril 40 mg If CKD progresses ACE-I will be weaned or discontinued with consideration of Carvedilol.

## 2020-12-09 NOTE — Assessment & Plan Note (Signed)
Albumin 2.6/ total protein 5.8 Nutrition consult @ SNF

## 2020-12-09 NOTE — Assessment & Plan Note (Addendum)
No NVD or HJR but edema persists. Edema dramatically better subjectively. Daily weights with strict I&O.  Consider changing ACE inhibitor to carvedilol if decompensates or if CKD progresses.

## 2020-12-09 NOTE — Patient Instructions (Signed)
See assessment and plan under each diagnosis in the problem list and acutely for this visit 

## 2020-12-09 NOTE — Assessment & Plan Note (Addendum)
BP soft; titrate ACE-I  antihypertensive medication down  if this persists

## 2020-12-09 NOTE — Progress Notes (Signed)
NURSING HOME LOCATION:  [facility] ROOM NUMBER:  116 A  CODE STATUS:  Full Code  PCP:  Ivery Quale MD  This is a comprehensive admission note to this SNFperformed on this date less than 30 days from date of admission. Included are preadmission medical/surgical history; reconciled medication list; family history; social history and comprehensive review of systems.  Corrections and additions to the records were documented. Comprehensive physical exam was also performed. Additionally a clinical summary was entered for each active diagnosis pertinent to this admission in the Problem List to enhance continuity of care.  HPI: Patient was hospitalized 7/3 - 12/05/2020 with diastolic congestive heart and pulmonary hypertension.  Patient presented with dyspnea and lower extremity edema with subjective progressive dyspnea over 3 weeks.  Initial vital signs included blood pressure 178/71 with respiratory rate of 23. Chest x-ray revealed increased lung markings bilaterally. BNP was 1310.  Echo revealed LVEF 50-55%.  Left atrial pressure was elevated and left atrium severely dilated.  Right ventricular pressure was severely elevated with pulmonary artery  pressure of 65.8 mmHg. Patient was aggressively diuresed with improvement in her symptoms.   High-sensitivity was troponin 40.  Subsequently this peaked at 61. Because of debilitation , she was discharged to SNF for PT/OT . Daily weights recommended with strict I/O.  Past medical and surgical history: Includes diagnoses of chronic pain, essential hypertension, PVD, and anxiety disorder. Surgeries and procedures include abdominal hysterectomy and TKA.  Social history: Nondrinker; never smoked.  Family history: Noncontributory due to advanced age.   Review of systems: Despite her advanced age she is an extremely good historian.  She states that the edema is dramatically better.  She prefaced that remark with "oh my  goodness". She states that she lost "6 pounds of water in the hospital".  She had previously weighed over 200 pounds but did lose weight with Weight Watchers. She states that she has had some shortness of breath the last 2 nights.  She clarifies this stating that her heart "beats faster". She describes occasional dysphagia.  She describes some abdominal discomfort postprandially.  She also has constipation. When asked about anxiety or depression; she states that she lives alone having been widowed 41 years ago.  She states that "I look at four walls".  She does have 2 sons 79 and 80 as well as her daughter 26.  She states that she has not considered an assisted living facility or SNF. She describes some urinary retention in that she must "push to start the stream".  Constitutional: No fever, fatigue  Eyes: No redness, discharge, pain, vision change ENT/mouth: No nasal congestion, purulent discharge, earache, change in hearing, sore throat  Cardiovascular: No chest pain, claudication  Respiratory: No cough, sputum production, hemoptysis, DOE, significant snoring, apnea  Gastrointestinal: No heartburn,  nausea /vomiting, rectal bleeding, melena Genitourinary: No dysuria, hematuria, pyuria, incontinence, nocturia Dermatologic: No rash, pruritus, change in appearance of skin Neurologic: No dizziness, headache, syncope, seizures, numbness, tingling Psychiatric: No significant insomnia, anorexia Endocrine: No change in hair/skin/nails, excessive thirst, excessive hunger, excessive urination  Hematologic/lymphatic: No significant bruising, lymphadenopathy, abnormal bleeding Allergy/immunology: No itchy/watery eyes, significant sneezing, urticaria, angioedema  Physical exam:  Pertinent or positive findings: She appears younger than her stated age.  Hair is starkly white.  Eyebrows are absent.  Ptosis is greater on the left than the right.  She retains her own teeth.  Low-grade rales are noted  anteriorly.  Pedal pulses are decreased.  She has  1+ edema at the sock line.  There is a dressing over the right shin.  She has extensive bruising over the left upper extremity.  Fusiform changes of the knees are present with well-healed operative scars.  She has severe mixed osteoarthritic changes of the hands.  General appearance: Adequately nourished; no acute distress, increased work of breathing is present.   Lymphatic: No lymphadenopathy about the head, neck, axilla. Eyes: No conjunctival inflammation or lid edema is present. There is no scleral icterus. Ears:  External ear exam shows no significant lesions or deformities.   Nose:  External nasal examination shows no deformity or inflammation. Nasal mucosa are pink and moist without lesions, exudates Oral exam: Lips and gums are healthy appearing.There is no oropharyngeal erythema or exudate. Neck:  No thyromegaly, masses, tenderness noted.    Heart:  Normal rate and regular rhythm. S1 and S2 normal without gallop, murmur, click, rub.  Lungs:  without wheezes, rhonchi,  rubs. Abdomen: Bowel sounds are normal.  Abdomen is soft and nontender with no organomegaly, hernias, masses. GU: Deferred  Extremities:  No cyanosis, clubbing. Neurologic exam: Balance, Rhomberg, finger to nose testing could not be completed due to clinical state Skin: Warm & dry w/o tenting. No significant rash.  See clinical summary under each active problem in the Problem List with associated updated therapeutic plan

## 2020-12-16 ENCOUNTER — Non-Acute Institutional Stay (SKILLED_NURSING_FACILITY): Payer: Medicare PPO | Admitting: Adult Health

## 2020-12-16 ENCOUNTER — Encounter: Payer: Self-pay | Admitting: Adult Health

## 2020-12-16 DIAGNOSIS — I5033 Acute on chronic diastolic (congestive) heart failure: Secondary | ICD-10-CM | POA: Diagnosis not present

## 2020-12-16 DIAGNOSIS — I10 Essential (primary) hypertension: Secondary | ICD-10-CM

## 2020-12-16 DIAGNOSIS — N1832 Chronic kidney disease, stage 3b: Secondary | ICD-10-CM | POA: Diagnosis not present

## 2020-12-16 DIAGNOSIS — F419 Anxiety disorder, unspecified: Secondary | ICD-10-CM

## 2020-12-16 DIAGNOSIS — R5381 Other malaise: Secondary | ICD-10-CM

## 2020-12-16 MED ORDER — HYDROCODONE-ACETAMINOPHEN 5-325 MG PO TABS: 1.0000 | ORAL_TABLET | Freq: Four times a day (QID) | ORAL | 0 refills | Status: DC | PRN

## 2020-12-16 NOTE — Progress Notes (Signed)
Location:  [facility] Room Number: 116 A Place of Service:  SNF (31) Provider:  Kenard GowerMedina-Vargas, Tayton Decaire, DNP, FNP-BC  Patient Care Team: Jarome MatinPaterson, [name], MD as PCP - General (Internal Medicine)  Extended Emergency Contact Information Primary Emergency Contact: Anne NgKelly,Sandra  [place] Home Phone: [phone] Work Phone: [phone] Mobile Phone: [phone] Relation: Daughter  Code Status:  FULL CODE  Goals of care: Advanced Directive information Advanced Directives 12/16/2020  Does Patient Have a Medical Advance Directive? No  Would patient like information on creating a medical advance directive? No - Patient declined     Chief Complaint  Patient presents with   Acute Visit    Short term rehab    HPI:  Pt is a 87 y.o. female seen today for medical management of chronic diseases. She is currently having a short-term rehabilitation, PT and OT. No reported SOB. SBPs ranging from 110 to 138, outlier 155. She takes Metoprolol succinate ER 50 mg daily and Benazepril 40 mg daily for hypertension.  She takes Furosemide 40 mg daily for chronic diastolic CHF. Repeat labs showed creatinine 1.09 and GFR 43.18, remained to be in CKD3b. She has not used her PRN Diazepam 5 mg BID for anxiety.    Past Medical History:  Diagnosis Date   Arthritis    Constipation    Hypertension    Microscopic hematuria    Nocturia    Postmenopausal atrophic vaginitis    Past Surgical History:  Procedure Laterality Date   ABDOMINAL HYSTERECTOMY     JOINT REPLACEMENT     knee replacement   TONSILLECTOMY      Allergies  Allergen Reactions   Cardura [Doxazosin Mesylate] Other (See Comments)    UNK reaction   Cardura [Doxazosin]     Other reaction(s): Unknown   Codeine Other (See Comments)    UNK reaction   Fosamax [Alendronate]     Other reaction(s): gastritis   Inderal [Propranolol] Other (See Comments)    UNK reaction   Penicillins Rash     Outpatient Encounter Medications as of 12/16/2020  Medication Sig   acetaminophen (TYLENOL) 325 MG tablet Take 2 tablets (650 mg total) by mouth every 6 (six) hours as needed for mild pain (or Fever >/= 101).   aspirin 81 MG EC tablet Take 1 tablet (81 mg total) by mouth daily. Swallow whole.   benazepril (LOTENSIN) 40 MG tablet Take 40 mg by mouth daily.   Calcium Carbonate-Vitamin D (CALTRATE 600+D PO) Take 1 tablet by mouth daily.    Cholecalciferol (VITAMIN D-3 PO) Take 2,000 Units by mouth daily.    diazepam (VALIUM) 5 MG tablet Take 1 tablet (5 mg total) by mouth 2 (two) times daily as needed for anxiety, sedation or muscle spasms.   furosemide (LASIX) 40 MG tablet Take 1 tablet (40 mg total) by mouth daily.   HYDROcodone-acetaminophen (NORCO/VICODIN) 5-325 MG tablet Take 1 tablet by mouth every 6 (six) hours as needed for moderate pain.   Lidocaine HCl 4 % CREA apply to painful wrist and fingers daily as needed   Liniments (BLUE-EMU SUPER STRENGTH) CREA Apply 1 application topically daily as needed (joint pain).   loratadine (CLARITIN) 10 MG tablet Take 10 mg by mouth daily as needed for allergies.   meclizine (ANTIVERT) 12.5 MG tablet Take 1 tablet (12.5 mg total) by mouth 2 (two) times daily as needed for dizziness.   metoprolol succinate (TOPROL-XL) 50 MG 24 hr tablet Take 50 mg by mouth daily.  Multiple Vitamin (MULTIVITAMIN) capsule Take 1 capsule by mouth daily.   polyethylene glycol powder (GLYCOLAX/MIRALAX) 17 GM/SCOOP powder Take 17 g by mouth daily as needed for mild constipation.   raloxifene (EVISTA) 60 MG tablet Take 60 mg by mouth daily.   vitamin B-12 (CYANOCOBALAMIN) 500 MCG tablet Take 500 mcg by mouth daily.   [DISCONTINUED] bisacodyl (DULCOLAX) 10 MG suppository Place 10 mg rectally. If not relieved by MOM, give 10 mg Bisacodyl suppositiory rectally X 1 dose in 24 hours as needed   [DISCONTINUED] Sodium Phosphates (RA SALINE ENEMA RE) Place rectally. If not  relieved by Biscodyl suppository, give disposable Saline Enema rectally X 1 dose/24 hrs as needed   No facility-administered encounter medications on file as of 12/16/2020.    Review of Systems  GENERAL: No change in appetite, no fatigue, no weight changes, no fever or chills  MOUTH and THROAT: Denies oral discomfort, gingival pain or bleeding RESPIRATORY: no cough, SOB, DOE, wheezing, hemoptysis CARDIAC: No chest pain, edema or palpitations GI: No abdominal pain, diarrhea, constipation, heart burn, nausea or vomiting GU: Denies dysuria, frequency, hematuria or discharge NEUROLOGICAL: Denies dizziness, syncope, numbness, or headache PSYCHIATRIC: Denies feelings of depression or anxiety. No report of hallucinations, insomnia, paranoia, or agitation   Immunization History  Administered Date(s) Administered   Fluad Quad(high Dose 65+) 02/09/2019   Influenza Split 05/31/2008, 03/12/2010, 03/03/2011, 02/09/2012, 02/20/2014, 02/12/2020   Influenza, High Dose Seasonal PF 01/23/2018   Pneumococcal Conjugate-13 12/25/2014   Tdap 10/22/2013   Zoster, Live 11/30/2012, 02/08/2013, 09/23/2016   Pertinent  Health Maintenance Due  Topic Date Due   DEXA SCAN  Never done   PNA vac Low Risk Adult (2 of 2 - PPSV23) 12/25/2015   INFLUENZA VACCINE  12/29/2020   No flowsheet data found.   Vitals:   12/16/20 1048  BP: (!) 110/50  Pulse: 67  Resp: 20  Temp: (!) 97.5 F (36.4 C)  Weight: 159 lb 6.4 oz (72.3 kg)  Height: 5\' 2"  (1.575 m)   Body mass index is 29.15 kg/m.  Physical Exam  GENERAL APPEARANCE: Well nourished. In no acute distress. Obese. SKIN:  Bilateral lower leg wound with dressing MOUTH and THROAT: Lips are without lesions. Oral mucosa is moist and without lesions. Tongue is normal in shape, size, and color and without lesions NECK: supple, trachea midline, no neck masses, no thyroid tenderness, no thyromegaly LYMPHATICS: No LAN in the neck, no supraclavicular  LAN RESPIRATORY: Breathing is even & unlabored, BS CTAB CARDIAC: RRR, no murmur,no extra heart sounds, no edema GI: Abdomen soft, normal BS, no masses, no tenderness EXTREMITIES:  Able to move X 4 extremities NEUROLOGICAL: There is no tremor. Speech is clear. Alert and oriented X 3. PSYCHIATRIC:  Affect and behavior are appropriate  Labs reviewed: Recent Labs    12/03/20 1007 12/04/20 0232 12/05/20 0432  NA 139 138 139  K 3.9 3.6 4.3  CL 103 101 102  CO2 29 32 30  GLUCOSE 105* 84 117*  BUN 27* 31* 35*  CREATININE 1.13* 1.16* 1.24*  CALCIUM 8.4* 8.0* 8.5*  MG 1.7 1.8 2.0  PHOS 3.0 3.2 3.5   Recent Labs    12/03/20 1007 12/04/20 0232 12/05/20 0432  AST 31 27 38  ALT 16 15 19   ALKPHOS 120 109 126  BILITOT 0.7 0.8 0.8  PROT 5.6* 5.0* 5.8*  ALBUMIN 2.5* 2.2* 2.6*   Recent Labs    12/03/20 1007 12/04/20 0232 12/05/20 0432  WBC 8.2 8.7 8.2  NEUTROABS 5.7 5.8 6.3  HGB 11.7* 11.4* 13.2  HCT 35.7* 34.1* 40.0  MCV 96.2 96.3 97.1  PLT 216 219 177   No results found for: TSH No results found for: HGBA1C No results found for: CHOL, HDL, LDLCALC, LDLDIRECT, TRIG, CHOLHDL  Significant Diagnostic Results in last 30 days:  DG Chest Port 1 View  Result Date: 11/30/2020 CLINICAL DATA:  87 year old with shortness of breath. EXAM: PORTABLE CHEST 1 VIEW COMPARISON:  None. FINDINGS: Single view of the chest demonstrates prominent lung and central vascular structures. Low lung volumes. Heart size is within normal limits. Negative for pneumothorax. Atherosclerotic calcifications at the aortic arch. No acute bone abnormality. IMPRESSION: Slightly prominent lung and vascular markings. Findings could represent chronic changes versus mild edema. No focal airspace disease or lung consolidation. Electronically Signed   By: Richarda Overlie M.D.   On: 11/30/2020 12:12   DG Abd Portable 1V  Result Date: 12/05/2020 CLINICAL DATA:  87 year old female with history of abdominal pain. EXAM: PORTABLE  ABDOMEN - 1 VIEW COMPARISON:  No priors. FINDINGS: No pathologic dilatation of small bowel or colon. Several nondilated loops of gas-filled small bowel are noted in the low abdomen and central anatomic pelvis. Gas is noted in the cecum, transverse colon and rectum. No radio-opaque calculi or other significant radiographic abnormality are seen. IMPRESSION: 1. Nonspecific, nonobstructive bowel gas pattern, as above. 2. No pneumoperitoneum. Electronically Signed   By: Trudie Reed M.D.   On: 12/05/2020 07:12   ECHOCARDIOGRAM COMPLETE  Result Date: 12/01/2020    ECHOCARDIOGRAM REPORT   Patient Name:   NANDI TONNESEN Korpi Date of Exam: 12/01/2020 Medical Rec #:  [id number]    Height:       62.0 in Accession #:    [id number]   Weight:       154.3 lb Date of Birth:  1924-03-06    BSA:          1.712 m Patient Age:    87 years     BP:           130/56 mmHg Patient Gender: F            HR:           84 bpm. Exam Location:  Inpatient Procedure: 2D Echo, Cardiac Doppler and Color Doppler Indications:    R06.02 SOB  History:        Patient has no prior history of Echocardiogram examinations.  Sonographer:    Roosvelt Maser RDCS Referring Phys: [id number] Cecille Po MELVIN IMPRESSIONS  1. Left ventricular ejection fraction, by estimation, is 50 to 55%. The left ventricle has low normal function. The left ventricle has no regional wall motion abnormalities. There is mild concentric left ventricular hypertrophy. Left ventricular diastolic parameters are indeterminate. Elevated left atrial pressure.  2. Right ventricular systolic function is normal. The right ventricular size is normal. There is severely elevated pulmonary artery systolic pressure. The estimated right ventricular systolic pressure is 65.8 mmHg.  3. Left atrial size was severely dilated.  4. The mitral valve is grossly normal. Mild mitral valve regurgitation.  5. Tricuspid valve regurgitation is mild to moderate.  6. The aortic valve was not well visualized. Aortic valve  regurgitation is mild to moderate. No aortic stenosis is present.  7. The inferior vena cava is normal in size with <50% respiratory variability, suggesting right atrial pressure of 8 mmHg. Comparison(s): No prior Echocardiogram. Conclusion(s)/Recommendation(s): Notable ectopy during valvular regurgitation and diastology assessment. FINDINGS  Left Ventricle:  Left ventricular ejection fraction, by estimation, is 50 to 55%. The left ventricle has low normal function. The left ventricle has no regional wall motion abnormalities. The left ventricular internal cavity size was normal in size. There is mild concentric left ventricular hypertrophy. Left ventricular diastolic parameters are indeterminate. Elevated left atrial pressure. Right Ventricle: The right ventricular size is normal. No increase in right ventricular wall thickness. Right ventricular systolic function is normal. There is severely elevated pulmonary artery systolic pressure. The tricuspid regurgitant velocity is 3.80 m/s, and with an assumed right atrial pressure of 8 mmHg, the estimated right ventricular systolic pressure is 65.8 mmHg. Left Atrium: Left atrial size was severely dilated. Right Atrium: Right atrial size was normal in size. Pericardium: There is no evidence of pericardial effusion. Mitral Valve: The mitral valve is grossly normal. Mild mitral valve regurgitation. Tricuspid Valve: The tricuspid valve is normal in structure. Tricuspid valve regurgitation is mild to moderate. No evidence of tricuspid stenosis. Aortic Valve: The aortic valve was not well visualized. Aortic valve regurgitation is mild to moderate. Aortic regurgitation PHT measures 718 msec. No aortic stenosis is present. Aortic valve mean gradient measures 8.0 mmHg. Aortic valve peak gradient measures 15.2 mmHg. Aortic valve area, by VTI measures 1.62 cm. Pulmonic Valve: The pulmonic valve was not well visualized. Pulmonic valve regurgitation is not visualized. No evidence of  pulmonic stenosis. Aorta: The aortic root is normal in size and structure. Venous: The inferior vena cava is normal in size with less than 50% respiratory variability, suggesting right atrial pressure of 8 mmHg. IAS/Shunts: The atrial septum is grossly normal.  LEFT VENTRICLE PLAX 2D LVIDd:         4.20 cm  Diastology LVIDs:         3.10 cm  LV e' medial:    3.59 cm/s LV PW:         1.20 cm  LV E/e' medial:  30.4 LV IVS:        1.10 cm  LV e' lateral:   7.72 cm/s LVOT diam:     2.00 cm  LV E/e' lateral: 14.1 LV SV:         81 LV SV Index:   47 LVOT Area:     3.14 cm  RIGHT VENTRICLE RV Basal diam:  3.70 cm LEFT ATRIUM             Index       RIGHT ATRIUM           Index LA diam:        3.40 cm 1.99 cm/m  RA Area:     16.40 cm LA Vol (A2C):   94.8 ml 55.37 ml/m RA Volume:   44.00 ml  25.70 ml/m LA Vol (A4C):   70.2 ml 41.00 ml/m LA Biplane Vol: 85.4 ml 49.88 ml/m  AORTIC VALVE AV Area (Vmax):    1.79 cm AV Area (Vmean):   1.78 cm AV Area (VTI):     1.62 cm AV Vmax:           195.00 cm/s AV Vmean:          131.000 cm/s AV VTI:            0.500 m AV Peak Grad:      15.2 mmHg AV Mean Grad:      8.0 mmHg LVOT Vmax:         111.00 cm/s LVOT Vmean:        74.400 cm/s LVOT  VTI:          0.258 m LVOT/AV VTI ratio: 0.52 AI PHT:            718 msec  AORTA Ao Root diam: 3.40 cm MITRAL VALVE                TRICUSPID VALVE MV Area (PHT): 3.31 cm     TR Peak grad:   57.8 mmHg MV Decel Time: 229 msec     TR Vmax:        380.00 cm/s MV E velocity: 109.00 cm/s MV A velocity: 83.80 cm/s   SHUNTS MV E/A ratio:  1.30         Systemic VTI:  0.26 m                             Systemic Diam: 2.00 cm Riley Lam MD Electronically signed by Riley Lam MD Signature Date/Time: 12/01/2020/5:01:00 PM    Final     Assessment/Plan  1. Essential hypertension -  BPs stable, continue Benazepril and Metoprolol succinate ER  2. Acute on chronic diastolic CHF (congestive heart failure) (HCC) -  no SOB, continue  Furosemide -  weigh daily  3. Stage 3b chronic kidney disease (HCC) Lab Results  Component Value Date   NA 139 12/05/2020   K 4.3 12/05/2020   CO2 30 12/05/2020   GLUCOSE 117 (H) 12/05/2020   BUN 35 (H) 12/05/2020   CREATININE 1.24 (H) 12/05/2020   CALCIUM 8.5 (L) 12/05/2020   GFRNONAA 40 (L) 12/05/2020   -  creatinine 1.24 and GFR 43.18, improved  4. Anxiety -  mood is stable, continue PRN Diazepam  5. Physical deconditioning -   continue PT and OT, for therapeutic strengthening exercises -  fall precautions     Family/ staff Communication:   Labs/tests ordered:    Goals of care:      Kenard Gower, DNP, MSN, FNP-BC [facility] [phone] (Monday-Friday 8:00 a.m. - 5:00 p.m.) [phone] (after hours)

## 2020-12-19 ENCOUNTER — Non-Acute Institutional Stay (SKILLED_NURSING_FACILITY): Payer: Medicare PPO | Admitting: Adult Health

## 2020-12-19 DIAGNOSIS — R5381 Other malaise: Secondary | ICD-10-CM

## 2020-12-19 DIAGNOSIS — M158 Other polyosteoarthritis: Secondary | ICD-10-CM

## 2020-12-19 DIAGNOSIS — N1832 Chronic kidney disease, stage 3b: Secondary | ICD-10-CM

## 2020-12-19 DIAGNOSIS — F419 Anxiety disorder, unspecified: Secondary | ICD-10-CM

## 2020-12-19 DIAGNOSIS — M810 Age-related osteoporosis without current pathological fracture: Secondary | ICD-10-CM

## 2020-12-19 DIAGNOSIS — I5033 Acute on chronic diastolic (congestive) heart failure: Secondary | ICD-10-CM

## 2020-12-19 DIAGNOSIS — I10 Essential (primary) hypertension: Secondary | ICD-10-CM | POA: Diagnosis not present

## 2020-12-19 NOTE — Progress Notes (Signed)
Location:  [facility] Room Number: 116 A Place of Service:  SNF (31) Provider:  Kenard Gower, DNP, FNP-BC  Patient Care Team: Jarome Matin, MD as PCP - General (Internal Medicine)  Extended Emergency Contact Information Primary Emergency Contact: Anne Ng [place] of [place] Home Phone: [phone] Work Phone: [phone] Mobile Phone: [phone] Relation: Daughter  Code Status:  FULL CODE  Goals of care: Advanced Directive information Advanced Directives 12/19/2020  Does Patient Have a Medical Advance Directive? No  Would patient like information on creating a medical advance directive? -     Chief Complaint  Patient presents with   Discharge Note    For discharge home on 12/21/20 with Home health PT and OT    HPI:  Pt is a 87 y.o. female who is for discharge home on 12/21/20 with Home health PT and OT.  She was admitted to [facility] on 12/05/20 post hospital admission,on 11/30/20 to 12/05/20. She has a PMH of anxiety and depression, hypertension, PVD and arthritis. She presented to the hospital with worsening dyspnea X 3 weeks, associated with lower extremity edema. Chest x-ray showed increased markings bilaterally with prominent hilar vasculature on the right. Echocardiogram LVEF 50 to 55%. She was treated in the hospital for acute on chronic CHF and severe chronic pulmonary hypertension. She was aggressively diuresed, -3.4L, with improvement of her symptoms.  Patient was admitted to this facility for short-term rehabilitation after the patient's recent hospitalization.  Patient has completed SNF rehabilitation and therapy has cleared the patient for discharge.   Past Medical History:  Diagnosis Date   Arthritis    Constipation    Hypertension    Microscopic hematuria    Nocturia    Postmenopausal atrophic vaginitis    Past Surgical History:  Procedure Laterality Date   ABDOMINAL HYSTERECTOMY      JOINT REPLACEMENT     knee replacement   TONSILLECTOMY      Allergies  Allergen Reactions   Alendronate Sodium Other (See Comments)   Cardura [Doxazosin Mesylate] Other (See Comments)    UNK reaction   Cardura [Doxazosin]     Other reaction(s): Unknown   Carvedilol Other (See Comments)   Codeine Other (See Comments)    UNK reaction   Fosamax [Alendronate]     Other reaction(s): gastritis   Inderal [Propranolol] Other (See Comments)    UNK reaction   Penicillin G Sodium Other (See Comments)   Penicillins Rash    Outpatient Encounter Medications as of 12/19/2020  Medication Sig   acetaminophen (TYLENOL) 325 MG tablet Take 2 tablets (650 mg total) by mouth every 6 (six) hours as needed for mild pain (or Fever >/= 101).   Calcium Carbonate-Vitamin D (CALTRATE 600+D PO) Take 1 tablet by mouth daily.    Cholecalciferol (VITAMIN D-3 PO) Take 2,000 Units by mouth daily.    Ensure (ENSURE) Take 237 mLs by mouth.   Liniments (BLUE-EMU SUPER STRENGTH) CREA Apply 1 application topically daily as needed (joint pain).   Multiple Vitamin (MULTIVITAMIN) capsule Take 1 capsule by mouth daily.   polyethylene glycol powder (GLYCOLAX/MIRALAX) 17 GM/SCOOP powder Take 17 g by mouth daily as needed for mild constipation.   vitamin B-12 (CYANOCOBALAMIN) 500 MCG tablet Take 500 mcg by mouth daily.   aspirin 81 MG EC tablet Take 1 tablet (81 mg total) by mouth daily. Swallow whole.   benazepril (LOTENSIN) 40 MG tablet Take 1 tablet (40 mg total) by mouth daily.   diazepam (  VALIUM) 5 MG tablet Take 1 tablet (5 mg total) by mouth 2 (two) times daily as needed for anxiety, sedation or muscle spasms.   furosemide (LASIX) 40 MG tablet Take 1 tablet (40 mg total) by mouth daily.   HYDROcodone-acetaminophen (NORCO/VICODIN) 5-325 MG tablet Take 1 tablet by mouth every 6 (six) hours as needed for moderate pain.   Lidocaine HCl 4 % CREA apply to painful wrist and fingers daily as needed   loratadine (CLARITIN)  10 MG tablet Take 1 tablet (10 mg total) by mouth daily as needed for allergies.   meclizine (ANTIVERT) 12.5 MG tablet Take 1 tablet (12.5 mg total) by mouth 2 (two) times daily as needed for dizziness.   metoprolol succinate (TOPROL-XL) 50 MG 24 hr tablet Take 1 tablet (50 mg total) by mouth daily.   raloxifene (EVISTA) 60 MG tablet Take 1 tablet (60 mg total) by mouth daily.   [DISCONTINUED] aspirin 81 MG EC tablet Take 1 tablet (81 mg total) by mouth daily. Swallow whole.   [DISCONTINUED] benazepril (LOTENSIN) 40 MG tablet Take 40 mg by mouth daily.   [DISCONTINUED] diazepam (VALIUM) 5 MG tablet Take 1 tablet (5 mg total) by mouth 2 (two) times daily as needed for anxiety, sedation or muscle spasms.   [DISCONTINUED] furosemide (LASIX) 40 MG tablet Take 1 tablet (40 mg total) by mouth daily.   [DISCONTINUED] HYDROcodone-acetaminophen (NORCO/VICODIN) 5-325 MG tablet Take 1 tablet by mouth every 6 (six) hours as needed for moderate pain.   [DISCONTINUED] Lidocaine HCl 4 % CREA apply to painful wrist and fingers daily as needed   [DISCONTINUED] loratadine (CLARITIN) 10 MG tablet Take 10 mg by mouth daily as needed for allergies.   [DISCONTINUED] meclizine (ANTIVERT) 12.5 MG tablet Take 1 tablet (12.5 mg total) by mouth 2 (two) times daily as needed for dizziness.   [DISCONTINUED] metoprolol succinate (TOPROL-XL) 50 MG 24 hr tablet Take 50 mg by mouth daily.   [DISCONTINUED] raloxifene (EVISTA) 60 MG tablet Take 60 mg by mouth daily.   No facility-administered encounter medications on file as of 12/19/2020.    Review of Systems  GENERAL: No change in appetite, no fatigue, no weight changes, no fever, chills or weakness MOUTH and THROAT: Denies oral discomfort, gingival pain or bleeding RESPIRATORY: no cough, SOB, DOE, wheezing, hemoptysis CARDIAC: No chest pai or palpitations GI: No abdominal pain, diarrhea, constipation, heart burn, nausea or vomiting GU: Denies dysuria, frequency, hematuria  or discharge NEUROLOGICAL: Denies dizziness, syncope, numbness, or headache PSYCHIATRIC: Denies feelings of depression or anxiety. No report of hallucinations, insomnia, paranoia, or agitation   Immunization History  Administered Date(s) Administered   Fluad Quad(high Dose 65+) 02/09/2019   Influenza Split 05/31/2008, 03/12/2010, 03/03/2011, 02/09/2012, 02/22/2013, 02/20/2014, 02/12/2020   Influenza, High Dose Seasonal PF 01/28/2017, 01/23/2018   Influenza,inj,Quad PF,6+ Mos 02/20/2014, 02/15/2015   Influenza-Unspecified 02/14/2016   Pneumococcal Conjugate-13 12/25/2014   Pneumococcal Polysaccharide-23 04/30/1998, 10/28/2010   Td 07/31/2007, 10/22/2013   Tdap 10/22/2013   Zoster, Live 11/30/2012, 02/08/2013, 09/23/2016   Pertinent  Health Maintenance Due  Topic Date Due   DEXA SCAN  Never done   INFLUENZA VACCINE  12/29/2020   PNA vac Low Risk Adult  Completed   No flowsheet data found.   Vitals:   12/19/20 1000  BP: 125/60  Pulse: 76  Resp: 17  Temp: 97.6 F (36.4 C)  Weight: 157 lb 6.4 oz (71.4 kg)  Height: 5\' 3"  (1.6 m)   Body mass index is 27.88 kg/m.  Physical Exam  GENERAL APPEARANCE: Well nourished. In no acute distress.  SKIN:  Skin is warm and dry.  MOUTH and THROAT: Lips are without lesions. Oral mucosa is moist and without lesions.  RESPIRATORY: Breathing is even & unlabored, BS CTAB CARDIAC: RRR, no murmur,no extra heart sounds, BLE trace edema GI: Abdomen soft, normal BS, no masses, no tenderness NEUROLOGICAL: There is no tremor. Speech is clear. Alert and oriented X 3. PSYCHIATRIC:  Affect and behavior are appropriate  Labs reviewed: Recent Labs    12/03/20 1007 12/04/20 0232 12/05/20 0432  NA 139 138 139  K 3.9 3.6 4.3  CL 103 101 102  CO2 29 32 30  GLUCOSE 105* 84 117*  BUN 27* 31* 35*  CREATININE 1.13* 1.16* 1.24*  CALCIUM 8.4* 8.0* 8.5*  MG 1.7 1.8 2.0  PHOS 3.0 3.2 3.5   Recent Labs    12/03/20 1007 12/04/20 0232 12/05/20 0432   AST 31 27 38  ALT 16 15 19   ALKPHOS 120 109 126  BILITOT 0.7 0.8 0.8  PROT 5.6* 5.0* 5.8*  ALBUMIN 2.5* 2.2* 2.6*   Recent Labs    12/03/20 1007 12/04/20 0232 12/05/20 0432  WBC 8.2 8.7 8.2  NEUTROABS 5.7 5.8 6.3  HGB 11.7* 11.4* 13.2  HCT 35.7* 34.1* 40.0  MCV 96.2 96.3 97.1  PLT 216 219 177    Significant Diagnostic Results in last 30 days:  DG Chest Port 1 View  Result Date: 11/30/2020 CLINICAL DATA:  87 year old with shortness of breath. EXAM: PORTABLE CHEST 1 VIEW COMPARISON:  None. FINDINGS: Single view of the chest demonstrates prominent lung and central vascular structures. Low lung volumes. Heart size is within normal limits. Negative for pneumothorax. Atherosclerotic calcifications at the aortic arch. No acute bone abnormality. IMPRESSION: Slightly prominent lung and vascular markings. Findings could represent chronic changes versus mild edema. No focal airspace disease or lung consolidation. Electronically Signed   By: 92 M.D.   On: 11/30/2020 12:12   DG Abd Portable 1V  Result Date: 12/05/2020 CLINICAL DATA:  87 year old female with history of abdominal pain. EXAM: PORTABLE ABDOMEN - 1 VIEW COMPARISON:  No priors. FINDINGS: No pathologic dilatation of small bowel or colon. Several nondilated loops of gas-filled small bowel are noted in the low abdomen and central anatomic pelvis. Gas is noted in the cecum, transverse colon and rectum. No radio-opaque calculi or other significant radiographic abnormality are seen. IMPRESSION: 1. Nonspecific, nonobstructive bowel gas pattern, as above. 2. No pneumoperitoneum. Electronically Signed   By: 92 M.D.   On: 12/05/2020 07:12   ECHOCARDIOGRAM COMPLETE  Result Date: 12/01/2020    ECHOCARDIOGRAM REPORT   Patient Name:   Susan Pitts Date of Exam: 12/01/2020 Medical Rec #:  02/01/2021    Height:       62.0 in Accession #:    [id number]   Weight:       154.3 lb Date of Birth:  1923/06/28    BSA:          1.712 m Patient  Age:    89 years     BP:           130/56 mmHg Patient Gender: F            HR:           84 bpm. Exam Location:  Inpatient Procedure: 2D Echo, Cardiac Doppler and Color Doppler Indications:    R06.02 SOB  History:  Patient has no prior history of Echocardiogram examinations.  Sonographer:    Roosvelt Maserachel Lane RDCS Referring Phys: [id number] Cecille PoLEXANDER B MELVIN IMPRESSIONS  1. Left ventricular ejection fraction, by estimation, is 50 to 55%. The left ventricle has low normal function. The left ventricle has no regional wall motion abnormalities. There is mild concentric left ventricular hypertrophy. Left ventricular diastolic parameters are indeterminate. Elevated left atrial pressure.  2. Right ventricular systolic function is normal. The right ventricular size is normal. There is severely elevated pulmonary artery systolic pressure. The estimated right ventricular systolic pressure is 65.8 mmHg.  3. Left atrial size was severely dilated.  4. The mitral valve is grossly normal. Mild mitral valve regurgitation.  5. Tricuspid valve regurgitation is mild to moderate.  6. The aortic valve was not well visualized. Aortic valve regurgitation is mild to moderate. No aortic stenosis is present.  7. The inferior vena cava is normal in size with <50% respiratory variability, suggesting right atrial pressure of 8 mmHg. Comparison(s): No prior Echocardiogram. Conclusion(s)/Recommendation(s): Notable ectopy during valvular regurgitation and diastology assessment. FINDINGS  Left Ventricle: Left ventricular ejection fraction, by estimation, is 50 to 55%. The left ventricle has low normal function. The left ventricle has no regional wall motion abnormalities. The left ventricular internal cavity size was normal in size. There is mild concentric left ventricular hypertrophy. Left ventricular diastolic parameters are indeterminate. Elevated left atrial pressure. Right Ventricle: The right ventricular size is normal. No increase in right  ventricular wall thickness. Right ventricular systolic function is normal. There is severely elevated pulmonary artery systolic pressure. The tricuspid regurgitant velocity is 3.80 m/s, and with an assumed right atrial pressure of 8 mmHg, the estimated right ventricular systolic pressure is 65.8 mmHg. Left Atrium: Left atrial size was severely dilated. Right Atrium: Right atrial size was normal in size. Pericardium: There is no evidence of pericardial effusion. Mitral Valve: The mitral valve is grossly normal. Mild mitral valve regurgitation. Tricuspid Valve: The tricuspid valve is normal in structure. Tricuspid valve regurgitation is mild to moderate. No evidence of tricuspid stenosis. Aortic Valve: The aortic valve was not well visualized. Aortic valve regurgitation is mild to moderate. Aortic regurgitation PHT measures 718 msec. No aortic stenosis is present. Aortic valve mean gradient measures 8.0 mmHg. Aortic valve peak gradient measures 15.2 mmHg. Aortic valve area, by VTI measures 1.62 cm. Pulmonic Valve: The pulmonic valve was not well visualized. Pulmonic valve regurgitation is not visualized. No evidence of pulmonic stenosis. Aorta: The aortic root is normal in size and structure. Venous: The inferior vena cava is normal in size with less than 50% respiratory variability, suggesting right atrial pressure of 8 mmHg. IAS/Shunts: The atrial septum is grossly normal.  LEFT VENTRICLE PLAX 2D LVIDd:         4.20 cm  Diastology LVIDs:         3.10 cm  LV e' medial:    3.59 cm/s LV PW:         1.20 cm  LV E/e' medial:  30.4 LV IVS:        1.10 cm  LV e' lateral:   7.72 cm/s LVOT diam:     2.00 cm  LV E/e' lateral: 14.1 LV SV:         81 LV SV Index:   47 LVOT Area:     3.14 cm  RIGHT VENTRICLE RV Basal diam:  3.70 cm LEFT ATRIUM             Index  RIGHT ATRIUM           Index LA diam:        3.40 cm 1.99 cm/m  RA Area:     16.40 cm LA Vol (A2C):   94.8 ml 55.37 ml/m RA Volume:   44.00 ml  25.70 ml/m LA  Vol (A4C):   70.2 ml 41.00 ml/m LA Biplane Vol: 85.4 ml 49.88 ml/m  AORTIC VALVE AV Area (Vmax):    1.79 cm AV Area (Vmean):   1.78 cm AV Area (VTI):     1.62 cm AV Vmax:           195.00 cm/s AV Vmean:          131.000 cm/s AV VTI:            0.500 m AV Peak Grad:      15.2 mmHg AV Mean Grad:      8.0 mmHg LVOT Vmax:         111.00 cm/s LVOT Vmean:        74.400 cm/s LVOT VTI:          0.258 m LVOT/AV VTI ratio: 0.52 AI PHT:            718 msec  AORTA Ao Root diam: 3.40 cm MITRAL VALVE                TRICUSPID VALVE MV Area (PHT): 3.31 cm     TR Peak grad:   57.8 mmHg MV Decel Time: 229 msec     TR Vmax:        380.00 cm/s MV E velocity: 109.00 cm/s MV A velocity: 83.80 cm/s   SHUNTS MV E/A ratio:  1.30         Systemic VTI:  0.26 m                             Systemic Diam: 2.00 cm Riley Lam MD Electronically signed by Riley Lam MD Signature Date/Time: 12/01/2020/5:01:00 PM    Final     Assessment/Plan  1. Acute on chronic diastolic CHF (congestive heart failure) (HCC) - furosemide (LASIX) 40 MG tablet; Take 1 tablet (40 mg total) by mouth daily.  Dispense: 30 tablet; Refill: 0  2. Stage 3b chronic kidney disease (HCC) Lab Results  Component Value Date   NA 139 12/05/2020   K 4.3 12/05/2020   CO2 30 12/05/2020   GLUCOSE 117 (H) 12/05/2020   BUN 35 (H) 12/05/2020   CREATININE 1.24 (H) 12/05/2020   CALCIUM 8.5 (L) 12/05/2020   GFRNONAA 40 (L) 12/05/2020    -   creatinine 43,18 and GFR 43.18, 12/11/20  3. Essential hypertension - benazepril (LOTENSIN) 40 MG tablet; Take 1 tablet (40 mg total) by mouth daily.  Dispense: 30 tablet; Refill: 0 - metoprolol succinate (TOPROL-XL) 50 MG 24 hr tablet; Take 1 tablet (50 mg total) by mouth daily.  Dispense: 30 tablet; Refill: 0  4. Age-related osteoporosis without current pathological fracture - raloxifene (EVISTA) 60 MG tablet; Take 1 tablet (60 mg total) by mouth daily.  Dispense: 30 tablet; Refill: 0  5. Other  osteoarthritis involving multiple joints - HYDROcodone-acetaminophen (NORCO/VICODIN) 5-325 MG tablet; Take 1 tablet by mouth every 6 (six) hours as needed for moderate pain.  Dispense: 15 tablet; Refill: 0 - Lidocaine HCl 4 % CREA; apply to painful wrist and fingers daily as needed  Dispense: 15 g; Refill: 0  6. Anxiety -  diazepam (VALIUM) 5 MG tablet; Take 1 tablet (5 mg total) by mouth 2 (two) times daily as needed for anxiety, sedation or muscle spasms.  Dispense: 14 tablet; Refill: 0  7. Physical deconditioning -  for Home health PT and OT, for therapeutic strengthening exercises     I have filled out patient's discharge paperwork and e-prescribed medications.  Patient will have home health PT and OT.  DME provided: Semielectric hospital bed, walker and shower chair.  Semielectric hospital bed  -patient suffers from chronic diastolic CHF and has trouble breathing at night when head is elevated less than 30 degrees or more.  Bed wedges do not provide enough elevation to resolve breathing issues. Shortness of breath cause patient to require frequent and immediate changes in body position which cannot be achieved with a normal bed.  Total discharge time: Greater than 30 minutes Greater than 50% was spent in counseling and coordination of care.   Discharge time involved coordination of the discharge process with social worker, nursing staff and therapy department. Medical justification for home health services/DME verified.   Kenard Gower, DNP, MSN, FNP-BC [facility] [phone] (Monday-Friday 8:00 a.m. - 5:00 p.m.) [phone] (after hours)

## 2020-12-20 MED ORDER — HYDROCODONE-ACETAMINOPHEN 5-325 MG PO TABS: 1.0000 | ORAL_TABLET | Freq: Four times a day (QID) | ORAL | 0 refills | Status: DC | PRN

## 2020-12-20 MED ORDER — RALOXIFENE HCL 60 MG PO TABS: 60.0000 mg | ORAL_TABLET | Freq: Every day | ORAL | 0 refills | Status: DC

## 2020-12-20 MED ORDER — BENAZEPRIL HCL 40 MG PO TABS: 40.0000 mg | ORAL_TABLET | Freq: Every day | ORAL | 0 refills | Status: DC

## 2020-12-20 MED ORDER — FUROSEMIDE 40 MG PO TABS: 40.0000 mg | ORAL_TABLET | Freq: Every day | ORAL | 0 refills | Status: DC

## 2020-12-20 MED ORDER — ASPIRIN 81 MG PO TBEC: 81.0000 mg | DELAYED_RELEASE_TABLET | Freq: Every day | ORAL | 0 refills | Status: DC

## 2020-12-20 MED ORDER — DIAZEPAM 5 MG PO TABS: 5.0000 mg | ORAL_TABLET | Freq: Two times a day (BID) | ORAL | 0 refills | Status: DC | PRN

## 2020-12-20 MED ORDER — MECLIZINE HCL 12.5 MG PO TABS: 12.5000 mg | ORAL_TABLET | Freq: Two times a day (BID) | ORAL | 0 refills | Status: DC | PRN

## 2020-12-20 MED ORDER — LORATADINE 10 MG PO TABS: 10.0000 mg | ORAL_TABLET | Freq: Every day | ORAL | 0 refills | Status: DC | PRN

## 2020-12-20 MED ORDER — LIDOCAINE HCL 4 % EX CREA: TOPICAL_CREAM | CUTANEOUS | 0 refills | Status: DC

## 2020-12-20 MED ORDER — METOPROLOL SUCCINATE ER 50 MG PO TB24: 50.0000 mg | ORAL_TABLET | Freq: Every day | ORAL | 0 refills | Status: DC

## 2020-12-23 DIAGNOSIS — I130 Hypertensive heart and chronic kidney disease with heart failure and stage 1 through stage 4 chronic kidney disease, or unspecified chronic kidney disease: Secondary | ICD-10-CM | POA: Diagnosis not present

## 2020-12-23 DIAGNOSIS — E441 Mild protein-calorie malnutrition: Secondary | ICD-10-CM | POA: Diagnosis not present

## 2020-12-23 DIAGNOSIS — I272 Pulmonary hypertension, unspecified: Secondary | ICD-10-CM | POA: Diagnosis not present

## 2020-12-23 DIAGNOSIS — I739 Peripheral vascular disease, unspecified: Secondary | ICD-10-CM | POA: Diagnosis not present

## 2020-12-23 DIAGNOSIS — F329 Major depressive disorder, single episode, unspecified: Secondary | ICD-10-CM | POA: Diagnosis not present

## 2020-12-23 DIAGNOSIS — G8929 Other chronic pain: Secondary | ICD-10-CM | POA: Diagnosis not present

## 2020-12-23 DIAGNOSIS — I5032 Chronic diastolic (congestive) heart failure: Secondary | ICD-10-CM | POA: Diagnosis not present

## 2020-12-23 DIAGNOSIS — N1832 Chronic kidney disease, stage 3b: Secondary | ICD-10-CM | POA: Diagnosis not present

## 2020-12-23 DIAGNOSIS — F419 Anxiety disorder, unspecified: Secondary | ICD-10-CM | POA: Diagnosis not present

## 2020-12-24 DIAGNOSIS — G8929 Other chronic pain: Secondary | ICD-10-CM | POA: Diagnosis not present

## 2020-12-24 DIAGNOSIS — F419 Anxiety disorder, unspecified: Secondary | ICD-10-CM | POA: Diagnosis not present

## 2020-12-24 DIAGNOSIS — I272 Pulmonary hypertension, unspecified: Secondary | ICD-10-CM | POA: Diagnosis not present

## 2020-12-24 DIAGNOSIS — E441 Mild protein-calorie malnutrition: Secondary | ICD-10-CM | POA: Diagnosis not present

## 2020-12-24 DIAGNOSIS — I5032 Chronic diastolic (congestive) heart failure: Secondary | ICD-10-CM | POA: Diagnosis not present

## 2020-12-24 DIAGNOSIS — I739 Peripheral vascular disease, unspecified: Secondary | ICD-10-CM | POA: Diagnosis not present

## 2020-12-24 DIAGNOSIS — F329 Major depressive disorder, single episode, unspecified: Secondary | ICD-10-CM | POA: Diagnosis not present

## 2020-12-24 DIAGNOSIS — N1832 Chronic kidney disease, stage 3b: Secondary | ICD-10-CM | POA: Diagnosis not present

## 2020-12-24 DIAGNOSIS — I130 Hypertensive heart and chronic kidney disease with heart failure and stage 1 through stage 4 chronic kidney disease, or unspecified chronic kidney disease: Secondary | ICD-10-CM | POA: Diagnosis not present

## 2020-12-26 DIAGNOSIS — I872 Venous insufficiency (chronic) (peripheral): Secondary | ICD-10-CM | POA: Diagnosis not present

## 2020-12-26 DIAGNOSIS — I5043 Acute on chronic combined systolic (congestive) and diastolic (congestive) heart failure: Secondary | ICD-10-CM | POA: Diagnosis not present

## 2020-12-26 DIAGNOSIS — I10 Essential (primary) hypertension: Secondary | ICD-10-CM | POA: Diagnosis not present

## 2020-12-26 DIAGNOSIS — R5381 Other malaise: Secondary | ICD-10-CM | POA: Diagnosis not present

## 2020-12-26 DIAGNOSIS — F419 Anxiety disorder, unspecified: Secondary | ICD-10-CM | POA: Diagnosis not present

## 2020-12-26 DIAGNOSIS — R269 Unspecified abnormalities of gait and mobility: Secondary | ICD-10-CM | POA: Diagnosis not present

## 2020-12-26 DIAGNOSIS — I272 Pulmonary hypertension, unspecified: Secondary | ICD-10-CM | POA: Diagnosis not present

## 2020-12-26 DIAGNOSIS — I739 Peripheral vascular disease, unspecified: Secondary | ICD-10-CM | POA: Diagnosis not present

## 2020-12-30 DIAGNOSIS — I130 Hypertensive heart and chronic kidney disease with heart failure and stage 1 through stage 4 chronic kidney disease, or unspecified chronic kidney disease: Secondary | ICD-10-CM | POA: Diagnosis not present

## 2020-12-30 DIAGNOSIS — I739 Peripheral vascular disease, unspecified: Secondary | ICD-10-CM | POA: Diagnosis not present

## 2020-12-30 DIAGNOSIS — E441 Mild protein-calorie malnutrition: Secondary | ICD-10-CM | POA: Diagnosis not present

## 2020-12-30 DIAGNOSIS — I272 Pulmonary hypertension, unspecified: Secondary | ICD-10-CM | POA: Diagnosis not present

## 2020-12-30 DIAGNOSIS — I5032 Chronic diastolic (congestive) heart failure: Secondary | ICD-10-CM | POA: Diagnosis not present

## 2020-12-30 DIAGNOSIS — N1832 Chronic kidney disease, stage 3b: Secondary | ICD-10-CM | POA: Diagnosis not present

## 2020-12-30 DIAGNOSIS — F329 Major depressive disorder, single episode, unspecified: Secondary | ICD-10-CM | POA: Diagnosis not present

## 2020-12-30 DIAGNOSIS — F419 Anxiety disorder, unspecified: Secondary | ICD-10-CM | POA: Diagnosis not present

## 2020-12-30 DIAGNOSIS — G8929 Other chronic pain: Secondary | ICD-10-CM | POA: Diagnosis not present

## 2020-12-31 DIAGNOSIS — N1832 Chronic kidney disease, stage 3b: Secondary | ICD-10-CM | POA: Diagnosis not present

## 2020-12-31 DIAGNOSIS — I272 Pulmonary hypertension, unspecified: Secondary | ICD-10-CM | POA: Diagnosis not present

## 2020-12-31 DIAGNOSIS — F419 Anxiety disorder, unspecified: Secondary | ICD-10-CM | POA: Diagnosis not present

## 2020-12-31 DIAGNOSIS — F329 Major depressive disorder, single episode, unspecified: Secondary | ICD-10-CM | POA: Diagnosis not present

## 2020-12-31 DIAGNOSIS — G8929 Other chronic pain: Secondary | ICD-10-CM | POA: Diagnosis not present

## 2020-12-31 DIAGNOSIS — E441 Mild protein-calorie malnutrition: Secondary | ICD-10-CM | POA: Diagnosis not present

## 2020-12-31 DIAGNOSIS — I739 Peripheral vascular disease, unspecified: Secondary | ICD-10-CM | POA: Diagnosis not present

## 2020-12-31 DIAGNOSIS — I5032 Chronic diastolic (congestive) heart failure: Secondary | ICD-10-CM | POA: Diagnosis not present

## 2020-12-31 DIAGNOSIS — I130 Hypertensive heart and chronic kidney disease with heart failure and stage 1 through stage 4 chronic kidney disease, or unspecified chronic kidney disease: Secondary | ICD-10-CM | POA: Diagnosis not present

## 2021-01-01 DIAGNOSIS — I272 Pulmonary hypertension, unspecified: Secondary | ICD-10-CM | POA: Diagnosis not present

## 2021-01-01 DIAGNOSIS — N1832 Chronic kidney disease, stage 3b: Secondary | ICD-10-CM | POA: Diagnosis not present

## 2021-01-01 DIAGNOSIS — F329 Major depressive disorder, single episode, unspecified: Secondary | ICD-10-CM | POA: Diagnosis not present

## 2021-01-01 DIAGNOSIS — I130 Hypertensive heart and chronic kidney disease with heart failure and stage 1 through stage 4 chronic kidney disease, or unspecified chronic kidney disease: Secondary | ICD-10-CM | POA: Diagnosis not present

## 2021-01-01 DIAGNOSIS — E441 Mild protein-calorie malnutrition: Secondary | ICD-10-CM | POA: Diagnosis not present

## 2021-01-01 DIAGNOSIS — F419 Anxiety disorder, unspecified: Secondary | ICD-10-CM | POA: Diagnosis not present

## 2021-01-01 DIAGNOSIS — I5032 Chronic diastolic (congestive) heart failure: Secondary | ICD-10-CM | POA: Diagnosis not present

## 2021-01-01 DIAGNOSIS — G8929 Other chronic pain: Secondary | ICD-10-CM | POA: Diagnosis not present

## 2021-01-01 DIAGNOSIS — I739 Peripheral vascular disease, unspecified: Secondary | ICD-10-CM | POA: Diagnosis not present

## 2021-01-05 DIAGNOSIS — I130 Hypertensive heart and chronic kidney disease with heart failure and stage 1 through stage 4 chronic kidney disease, or unspecified chronic kidney disease: Secondary | ICD-10-CM | POA: Diagnosis not present

## 2021-01-05 DIAGNOSIS — G8929 Other chronic pain: Secondary | ICD-10-CM | POA: Diagnosis not present

## 2021-01-05 DIAGNOSIS — I5032 Chronic diastolic (congestive) heart failure: Secondary | ICD-10-CM | POA: Diagnosis not present

## 2021-01-05 DIAGNOSIS — F329 Major depressive disorder, single episode, unspecified: Secondary | ICD-10-CM | POA: Diagnosis not present

## 2021-01-05 DIAGNOSIS — E441 Mild protein-calorie malnutrition: Secondary | ICD-10-CM | POA: Diagnosis not present

## 2021-01-05 DIAGNOSIS — I272 Pulmonary hypertension, unspecified: Secondary | ICD-10-CM | POA: Diagnosis not present

## 2021-01-05 DIAGNOSIS — F419 Anxiety disorder, unspecified: Secondary | ICD-10-CM | POA: Diagnosis not present

## 2021-01-05 DIAGNOSIS — N1832 Chronic kidney disease, stage 3b: Secondary | ICD-10-CM | POA: Diagnosis not present

## 2021-01-05 DIAGNOSIS — I739 Peripheral vascular disease, unspecified: Secondary | ICD-10-CM | POA: Diagnosis not present

## 2021-01-06 DIAGNOSIS — N1832 Chronic kidney disease, stage 3b: Secondary | ICD-10-CM | POA: Diagnosis not present

## 2021-01-06 DIAGNOSIS — G8929 Other chronic pain: Secondary | ICD-10-CM | POA: Diagnosis not present

## 2021-01-06 DIAGNOSIS — I272 Pulmonary hypertension, unspecified: Secondary | ICD-10-CM | POA: Diagnosis not present

## 2021-01-06 DIAGNOSIS — I130 Hypertensive heart and chronic kidney disease with heart failure and stage 1 through stage 4 chronic kidney disease, or unspecified chronic kidney disease: Secondary | ICD-10-CM | POA: Diagnosis not present

## 2021-01-06 DIAGNOSIS — F419 Anxiety disorder, unspecified: Secondary | ICD-10-CM | POA: Diagnosis not present

## 2021-01-06 DIAGNOSIS — E441 Mild protein-calorie malnutrition: Secondary | ICD-10-CM | POA: Diagnosis not present

## 2021-01-06 DIAGNOSIS — I5032 Chronic diastolic (congestive) heart failure: Secondary | ICD-10-CM | POA: Diagnosis not present

## 2021-01-06 DIAGNOSIS — I739 Peripheral vascular disease, unspecified: Secondary | ICD-10-CM | POA: Diagnosis not present

## 2021-01-06 DIAGNOSIS — F329 Major depressive disorder, single episode, unspecified: Secondary | ICD-10-CM | POA: Diagnosis not present

## 2021-01-07 DIAGNOSIS — I130 Hypertensive heart and chronic kidney disease with heart failure and stage 1 through stage 4 chronic kidney disease, or unspecified chronic kidney disease: Secondary | ICD-10-CM | POA: Diagnosis not present

## 2021-01-07 DIAGNOSIS — I272 Pulmonary hypertension, unspecified: Secondary | ICD-10-CM | POA: Diagnosis not present

## 2021-01-07 DIAGNOSIS — I5032 Chronic diastolic (congestive) heart failure: Secondary | ICD-10-CM | POA: Diagnosis not present

## 2021-01-07 DIAGNOSIS — G8929 Other chronic pain: Secondary | ICD-10-CM | POA: Diagnosis not present

## 2021-01-07 DIAGNOSIS — E441 Mild protein-calorie malnutrition: Secondary | ICD-10-CM | POA: Diagnosis not present

## 2021-01-07 DIAGNOSIS — F419 Anxiety disorder, unspecified: Secondary | ICD-10-CM | POA: Diagnosis not present

## 2021-01-07 DIAGNOSIS — F329 Major depressive disorder, single episode, unspecified: Secondary | ICD-10-CM | POA: Diagnosis not present

## 2021-01-07 DIAGNOSIS — N1832 Chronic kidney disease, stage 3b: Secondary | ICD-10-CM | POA: Diagnosis not present

## 2021-01-07 DIAGNOSIS — I739 Peripheral vascular disease, unspecified: Secondary | ICD-10-CM | POA: Diagnosis not present

## 2021-01-08 DIAGNOSIS — F329 Major depressive disorder, single episode, unspecified: Secondary | ICD-10-CM | POA: Diagnosis not present

## 2021-01-08 DIAGNOSIS — N1832 Chronic kidney disease, stage 3b: Secondary | ICD-10-CM | POA: Diagnosis not present

## 2021-01-08 DIAGNOSIS — I272 Pulmonary hypertension, unspecified: Secondary | ICD-10-CM | POA: Diagnosis not present

## 2021-01-08 DIAGNOSIS — G8929 Other chronic pain: Secondary | ICD-10-CM | POA: Diagnosis not present

## 2021-01-08 DIAGNOSIS — I130 Hypertensive heart and chronic kidney disease with heart failure and stage 1 through stage 4 chronic kidney disease, or unspecified chronic kidney disease: Secondary | ICD-10-CM | POA: Diagnosis not present

## 2021-01-08 DIAGNOSIS — I739 Peripheral vascular disease, unspecified: Secondary | ICD-10-CM | POA: Diagnosis not present

## 2021-01-08 DIAGNOSIS — E441 Mild protein-calorie malnutrition: Secondary | ICD-10-CM | POA: Diagnosis not present

## 2021-01-08 DIAGNOSIS — F419 Anxiety disorder, unspecified: Secondary | ICD-10-CM | POA: Diagnosis not present

## 2021-01-08 DIAGNOSIS — I5032 Chronic diastolic (congestive) heart failure: Secondary | ICD-10-CM | POA: Diagnosis not present

## 2021-01-12 DIAGNOSIS — N1832 Chronic kidney disease, stage 3b: Secondary | ICD-10-CM | POA: Diagnosis not present

## 2021-01-12 DIAGNOSIS — E441 Mild protein-calorie malnutrition: Secondary | ICD-10-CM | POA: Diagnosis not present

## 2021-01-12 DIAGNOSIS — I5032 Chronic diastolic (congestive) heart failure: Secondary | ICD-10-CM | POA: Diagnosis not present

## 2021-01-12 DIAGNOSIS — F329 Major depressive disorder, single episode, unspecified: Secondary | ICD-10-CM | POA: Diagnosis not present

## 2021-01-12 DIAGNOSIS — I130 Hypertensive heart and chronic kidney disease with heart failure and stage 1 through stage 4 chronic kidney disease, or unspecified chronic kidney disease: Secondary | ICD-10-CM | POA: Diagnosis not present

## 2021-01-12 DIAGNOSIS — I739 Peripheral vascular disease, unspecified: Secondary | ICD-10-CM | POA: Diagnosis not present

## 2021-01-12 DIAGNOSIS — I272 Pulmonary hypertension, unspecified: Secondary | ICD-10-CM | POA: Diagnosis not present

## 2021-01-12 DIAGNOSIS — G8929 Other chronic pain: Secondary | ICD-10-CM | POA: Diagnosis not present

## 2021-01-12 DIAGNOSIS — F419 Anxiety disorder, unspecified: Secondary | ICD-10-CM | POA: Diagnosis not present

## 2021-01-13 DIAGNOSIS — N1832 Chronic kidney disease, stage 3b: Secondary | ICD-10-CM | POA: Diagnosis not present

## 2021-01-13 DIAGNOSIS — F419 Anxiety disorder, unspecified: Secondary | ICD-10-CM | POA: Diagnosis not present

## 2021-01-13 DIAGNOSIS — I739 Peripheral vascular disease, unspecified: Secondary | ICD-10-CM | POA: Diagnosis not present

## 2021-01-13 DIAGNOSIS — G8929 Other chronic pain: Secondary | ICD-10-CM | POA: Diagnosis not present

## 2021-01-13 DIAGNOSIS — E441 Mild protein-calorie malnutrition: Secondary | ICD-10-CM | POA: Diagnosis not present

## 2021-01-13 DIAGNOSIS — F329 Major depressive disorder, single episode, unspecified: Secondary | ICD-10-CM | POA: Diagnosis not present

## 2021-01-13 DIAGNOSIS — I5032 Chronic diastolic (congestive) heart failure: Secondary | ICD-10-CM | POA: Diagnosis not present

## 2021-01-13 DIAGNOSIS — I272 Pulmonary hypertension, unspecified: Secondary | ICD-10-CM | POA: Diagnosis not present

## 2021-01-13 DIAGNOSIS — I130 Hypertensive heart and chronic kidney disease with heart failure and stage 1 through stage 4 chronic kidney disease, or unspecified chronic kidney disease: Secondary | ICD-10-CM | POA: Diagnosis not present

## 2021-01-14 DIAGNOSIS — N1832 Chronic kidney disease, stage 3b: Secondary | ICD-10-CM | POA: Diagnosis not present

## 2021-01-14 DIAGNOSIS — I272 Pulmonary hypertension, unspecified: Secondary | ICD-10-CM | POA: Diagnosis not present

## 2021-01-14 DIAGNOSIS — F419 Anxiety disorder, unspecified: Secondary | ICD-10-CM | POA: Diagnosis not present

## 2021-01-14 DIAGNOSIS — G8929 Other chronic pain: Secondary | ICD-10-CM | POA: Diagnosis not present

## 2021-01-14 DIAGNOSIS — I5032 Chronic diastolic (congestive) heart failure: Secondary | ICD-10-CM | POA: Diagnosis not present

## 2021-01-14 DIAGNOSIS — I739 Peripheral vascular disease, unspecified: Secondary | ICD-10-CM | POA: Diagnosis not present

## 2021-01-14 DIAGNOSIS — I130 Hypertensive heart and chronic kidney disease with heart failure and stage 1 through stage 4 chronic kidney disease, or unspecified chronic kidney disease: Secondary | ICD-10-CM | POA: Diagnosis not present

## 2021-01-14 DIAGNOSIS — F329 Major depressive disorder, single episode, unspecified: Secondary | ICD-10-CM | POA: Diagnosis not present

## 2021-01-14 DIAGNOSIS — E441 Mild protein-calorie malnutrition: Secondary | ICD-10-CM | POA: Diagnosis not present

## 2021-01-15 DIAGNOSIS — N1832 Chronic kidney disease, stage 3b: Secondary | ICD-10-CM | POA: Diagnosis not present

## 2021-01-15 DIAGNOSIS — F419 Anxiety disorder, unspecified: Secondary | ICD-10-CM | POA: Diagnosis not present

## 2021-01-15 DIAGNOSIS — I5032 Chronic diastolic (congestive) heart failure: Secondary | ICD-10-CM | POA: Diagnosis not present

## 2021-01-15 DIAGNOSIS — G8929 Other chronic pain: Secondary | ICD-10-CM | POA: Diagnosis not present

## 2021-01-15 DIAGNOSIS — E441 Mild protein-calorie malnutrition: Secondary | ICD-10-CM | POA: Diagnosis not present

## 2021-01-15 DIAGNOSIS — I272 Pulmonary hypertension, unspecified: Secondary | ICD-10-CM | POA: Diagnosis not present

## 2021-01-15 DIAGNOSIS — I739 Peripheral vascular disease, unspecified: Secondary | ICD-10-CM | POA: Diagnosis not present

## 2021-01-15 DIAGNOSIS — I130 Hypertensive heart and chronic kidney disease with heart failure and stage 1 through stage 4 chronic kidney disease, or unspecified chronic kidney disease: Secondary | ICD-10-CM | POA: Diagnosis not present

## 2021-01-15 DIAGNOSIS — F329 Major depressive disorder, single episode, unspecified: Secondary | ICD-10-CM | POA: Diagnosis not present

## 2021-01-16 DIAGNOSIS — I10 Essential (primary) hypertension: Secondary | ICD-10-CM | POA: Diagnosis not present

## 2021-01-17 ENCOUNTER — Other Ambulatory Visit: Payer: Self-pay | Admitting: Adult Health

## 2021-01-19 DIAGNOSIS — I272 Pulmonary hypertension, unspecified: Secondary | ICD-10-CM | POA: Diagnosis not present

## 2021-01-19 DIAGNOSIS — N1832 Chronic kidney disease, stage 3b: Secondary | ICD-10-CM | POA: Diagnosis not present

## 2021-01-19 DIAGNOSIS — I739 Peripheral vascular disease, unspecified: Secondary | ICD-10-CM | POA: Diagnosis not present

## 2021-01-19 DIAGNOSIS — E441 Mild protein-calorie malnutrition: Secondary | ICD-10-CM | POA: Diagnosis not present

## 2021-01-19 DIAGNOSIS — F329 Major depressive disorder, single episode, unspecified: Secondary | ICD-10-CM | POA: Diagnosis not present

## 2021-01-19 DIAGNOSIS — G8929 Other chronic pain: Secondary | ICD-10-CM | POA: Diagnosis not present

## 2021-01-19 DIAGNOSIS — I130 Hypertensive heart and chronic kidney disease with heart failure and stage 1 through stage 4 chronic kidney disease, or unspecified chronic kidney disease: Secondary | ICD-10-CM | POA: Diagnosis not present

## 2021-01-19 DIAGNOSIS — I5032 Chronic diastolic (congestive) heart failure: Secondary | ICD-10-CM | POA: Diagnosis not present

## 2021-01-19 DIAGNOSIS — F419 Anxiety disorder, unspecified: Secondary | ICD-10-CM | POA: Diagnosis not present

## 2021-01-20 DIAGNOSIS — I739 Peripheral vascular disease, unspecified: Secondary | ICD-10-CM | POA: Diagnosis not present

## 2021-01-20 DIAGNOSIS — I272 Pulmonary hypertension, unspecified: Secondary | ICD-10-CM | POA: Diagnosis not present

## 2021-01-20 DIAGNOSIS — M6281 Muscle weakness (generalized): Secondary | ICD-10-CM | POA: Diagnosis not present

## 2021-01-20 DIAGNOSIS — N1832 Chronic kidney disease, stage 3b: Secondary | ICD-10-CM | POA: Diagnosis not present

## 2021-01-20 DIAGNOSIS — G8929 Other chronic pain: Secondary | ICD-10-CM | POA: Diagnosis not present

## 2021-01-20 DIAGNOSIS — E441 Mild protein-calorie malnutrition: Secondary | ICD-10-CM | POA: Diagnosis not present

## 2021-01-20 DIAGNOSIS — I5032 Chronic diastolic (congestive) heart failure: Secondary | ICD-10-CM | POA: Diagnosis not present

## 2021-01-20 DIAGNOSIS — F329 Major depressive disorder, single episode, unspecified: Secondary | ICD-10-CM | POA: Diagnosis not present

## 2021-01-20 DIAGNOSIS — I130 Hypertensive heart and chronic kidney disease with heart failure and stage 1 through stage 4 chronic kidney disease, or unspecified chronic kidney disease: Secondary | ICD-10-CM | POA: Diagnosis not present

## 2021-01-20 DIAGNOSIS — F419 Anxiety disorder, unspecified: Secondary | ICD-10-CM | POA: Diagnosis not present

## 2021-01-21 DIAGNOSIS — I739 Peripheral vascular disease, unspecified: Secondary | ICD-10-CM | POA: Diagnosis not present

## 2021-01-21 DIAGNOSIS — I272 Pulmonary hypertension, unspecified: Secondary | ICD-10-CM | POA: Diagnosis not present

## 2021-01-21 DIAGNOSIS — F329 Major depressive disorder, single episode, unspecified: Secondary | ICD-10-CM | POA: Diagnosis not present

## 2021-01-21 DIAGNOSIS — I130 Hypertensive heart and chronic kidney disease with heart failure and stage 1 through stage 4 chronic kidney disease, or unspecified chronic kidney disease: Secondary | ICD-10-CM | POA: Diagnosis not present

## 2021-01-21 DIAGNOSIS — G8929 Other chronic pain: Secondary | ICD-10-CM | POA: Diagnosis not present

## 2021-01-21 DIAGNOSIS — E441 Mild protein-calorie malnutrition: Secondary | ICD-10-CM | POA: Diagnosis not present

## 2021-01-21 DIAGNOSIS — F419 Anxiety disorder, unspecified: Secondary | ICD-10-CM | POA: Diagnosis not present

## 2021-01-21 DIAGNOSIS — N1832 Chronic kidney disease, stage 3b: Secondary | ICD-10-CM | POA: Diagnosis not present

## 2021-01-21 DIAGNOSIS — I5032 Chronic diastolic (congestive) heart failure: Secondary | ICD-10-CM | POA: Diagnosis not present

## 2021-01-22 DIAGNOSIS — I272 Pulmonary hypertension, unspecified: Secondary | ICD-10-CM | POA: Diagnosis not present

## 2021-01-22 DIAGNOSIS — I130 Hypertensive heart and chronic kidney disease with heart failure and stage 1 through stage 4 chronic kidney disease, or unspecified chronic kidney disease: Secondary | ICD-10-CM | POA: Diagnosis not present

## 2021-01-22 DIAGNOSIS — I739 Peripheral vascular disease, unspecified: Secondary | ICD-10-CM | POA: Diagnosis not present

## 2021-01-22 DIAGNOSIS — G8929 Other chronic pain: Secondary | ICD-10-CM | POA: Diagnosis not present

## 2021-01-22 DIAGNOSIS — F329 Major depressive disorder, single episode, unspecified: Secondary | ICD-10-CM | POA: Diagnosis not present

## 2021-01-22 DIAGNOSIS — I5032 Chronic diastolic (congestive) heart failure: Secondary | ICD-10-CM | POA: Diagnosis not present

## 2021-01-22 DIAGNOSIS — N1832 Chronic kidney disease, stage 3b: Secondary | ICD-10-CM | POA: Diagnosis not present

## 2021-01-22 DIAGNOSIS — F419 Anxiety disorder, unspecified: Secondary | ICD-10-CM | POA: Diagnosis not present

## 2021-01-22 DIAGNOSIS — E441 Mild protein-calorie malnutrition: Secondary | ICD-10-CM | POA: Diagnosis not present

## 2021-01-23 DIAGNOSIS — E669 Obesity, unspecified: Secondary | ICD-10-CM | POA: Diagnosis not present

## 2021-01-23 DIAGNOSIS — R82998 Other abnormal findings in urine: Secondary | ICD-10-CM | POA: Diagnosis not present

## 2021-01-23 DIAGNOSIS — I272 Pulmonary hypertension, unspecified: Secondary | ICD-10-CM | POA: Diagnosis not present

## 2021-01-23 DIAGNOSIS — I5043 Acute on chronic combined systolic (congestive) and diastolic (congestive) heart failure: Secondary | ICD-10-CM | POA: Diagnosis not present

## 2021-01-23 DIAGNOSIS — I10 Essential (primary) hypertension: Secondary | ICD-10-CM | POA: Diagnosis not present

## 2021-01-23 DIAGNOSIS — Z0000 Encounter for general adult medical examination without abnormal findings: Secondary | ICD-10-CM | POA: Diagnosis not present

## 2021-01-23 DIAGNOSIS — M170 Bilateral primary osteoarthritis of knee: Secondary | ICD-10-CM | POA: Diagnosis not present

## 2021-01-23 DIAGNOSIS — I872 Venous insufficiency (chronic) (peripheral): Secondary | ICD-10-CM | POA: Diagnosis not present

## 2021-01-23 DIAGNOSIS — I739 Peripheral vascular disease, unspecified: Secondary | ICD-10-CM | POA: Diagnosis not present

## 2021-01-23 DIAGNOSIS — R5381 Other malaise: Secondary | ICD-10-CM | POA: Diagnosis not present

## 2021-01-26 DIAGNOSIS — G8929 Other chronic pain: Secondary | ICD-10-CM | POA: Diagnosis not present

## 2021-01-26 DIAGNOSIS — E441 Mild protein-calorie malnutrition: Secondary | ICD-10-CM | POA: Diagnosis not present

## 2021-01-26 DIAGNOSIS — I272 Pulmonary hypertension, unspecified: Secondary | ICD-10-CM | POA: Diagnosis not present

## 2021-01-26 DIAGNOSIS — F329 Major depressive disorder, single episode, unspecified: Secondary | ICD-10-CM | POA: Diagnosis not present

## 2021-01-26 DIAGNOSIS — F419 Anxiety disorder, unspecified: Secondary | ICD-10-CM | POA: Diagnosis not present

## 2021-01-26 DIAGNOSIS — I130 Hypertensive heart and chronic kidney disease with heart failure and stage 1 through stage 4 chronic kidney disease, or unspecified chronic kidney disease: Secondary | ICD-10-CM | POA: Diagnosis not present

## 2021-01-26 DIAGNOSIS — I739 Peripheral vascular disease, unspecified: Secondary | ICD-10-CM | POA: Diagnosis not present

## 2021-01-26 DIAGNOSIS — I5032 Chronic diastolic (congestive) heart failure: Secondary | ICD-10-CM | POA: Diagnosis not present

## 2021-01-26 DIAGNOSIS — N1832 Chronic kidney disease, stage 3b: Secondary | ICD-10-CM | POA: Diagnosis not present

## 2021-01-29 DIAGNOSIS — I272 Pulmonary hypertension, unspecified: Secondary | ICD-10-CM | POA: Diagnosis not present

## 2021-01-29 DIAGNOSIS — I130 Hypertensive heart and chronic kidney disease with heart failure and stage 1 through stage 4 chronic kidney disease, or unspecified chronic kidney disease: Secondary | ICD-10-CM | POA: Diagnosis not present

## 2021-01-29 DIAGNOSIS — E441 Mild protein-calorie malnutrition: Secondary | ICD-10-CM | POA: Diagnosis not present

## 2021-01-29 DIAGNOSIS — G8929 Other chronic pain: Secondary | ICD-10-CM | POA: Diagnosis not present

## 2021-01-29 DIAGNOSIS — F329 Major depressive disorder, single episode, unspecified: Secondary | ICD-10-CM | POA: Diagnosis not present

## 2021-01-29 DIAGNOSIS — I739 Peripheral vascular disease, unspecified: Secondary | ICD-10-CM | POA: Diagnosis not present

## 2021-01-29 DIAGNOSIS — I5032 Chronic diastolic (congestive) heart failure: Secondary | ICD-10-CM | POA: Diagnosis not present

## 2021-01-29 DIAGNOSIS — N1832 Chronic kidney disease, stage 3b: Secondary | ICD-10-CM | POA: Diagnosis not present

## 2021-01-29 DIAGNOSIS — F419 Anxiety disorder, unspecified: Secondary | ICD-10-CM | POA: Diagnosis not present

## 2021-02-01 DIAGNOSIS — I83009 Varicose veins of unspecified lower extremity with ulcer of unspecified site: Secondary | ICD-10-CM | POA: Diagnosis not present

## 2021-02-02 ENCOUNTER — Other Ambulatory Visit: Payer: Self-pay | Admitting: Adult Health

## 2021-02-02 DIAGNOSIS — I10 Essential (primary) hypertension: Secondary | ICD-10-CM

## 2021-02-03 DIAGNOSIS — I872 Venous insufficiency (chronic) (peripheral): Secondary | ICD-10-CM | POA: Diagnosis not present

## 2021-02-03 DIAGNOSIS — S80811A Abrasion, right lower leg, initial encounter: Secondary | ICD-10-CM | POA: Diagnosis not present

## 2021-02-03 DIAGNOSIS — I10 Essential (primary) hypertension: Secondary | ICD-10-CM | POA: Diagnosis not present

## 2021-02-03 DIAGNOSIS — I272 Pulmonary hypertension, unspecified: Secondary | ICD-10-CM | POA: Diagnosis not present

## 2021-02-03 DIAGNOSIS — I5043 Acute on chronic combined systolic (congestive) and diastolic (congestive) heart failure: Secondary | ICD-10-CM | POA: Diagnosis not present

## 2021-02-03 DIAGNOSIS — I739 Peripheral vascular disease, unspecified: Secondary | ICD-10-CM | POA: Diagnosis not present

## 2021-02-04 DIAGNOSIS — I130 Hypertensive heart and chronic kidney disease with heart failure and stage 1 through stage 4 chronic kidney disease, or unspecified chronic kidney disease: Secondary | ICD-10-CM | POA: Diagnosis not present

## 2021-02-04 DIAGNOSIS — I5032 Chronic diastolic (congestive) heart failure: Secondary | ICD-10-CM | POA: Diagnosis not present

## 2021-02-04 DIAGNOSIS — I272 Pulmonary hypertension, unspecified: Secondary | ICD-10-CM | POA: Diagnosis not present

## 2021-02-04 DIAGNOSIS — N1832 Chronic kidney disease, stage 3b: Secondary | ICD-10-CM | POA: Diagnosis not present

## 2021-02-04 DIAGNOSIS — F419 Anxiety disorder, unspecified: Secondary | ICD-10-CM | POA: Diagnosis not present

## 2021-02-04 DIAGNOSIS — I739 Peripheral vascular disease, unspecified: Secondary | ICD-10-CM | POA: Diagnosis not present

## 2021-02-04 DIAGNOSIS — G8929 Other chronic pain: Secondary | ICD-10-CM | POA: Diagnosis not present

## 2021-02-04 DIAGNOSIS — E441 Mild protein-calorie malnutrition: Secondary | ICD-10-CM | POA: Diagnosis not present

## 2021-02-04 DIAGNOSIS — F329 Major depressive disorder, single episode, unspecified: Secondary | ICD-10-CM | POA: Diagnosis not present

## 2021-02-05 DIAGNOSIS — F419 Anxiety disorder, unspecified: Secondary | ICD-10-CM | POA: Diagnosis not present

## 2021-02-05 DIAGNOSIS — N1832 Chronic kidney disease, stage 3b: Secondary | ICD-10-CM | POA: Diagnosis not present

## 2021-02-05 DIAGNOSIS — F329 Major depressive disorder, single episode, unspecified: Secondary | ICD-10-CM | POA: Diagnosis not present

## 2021-02-05 DIAGNOSIS — E441 Mild protein-calorie malnutrition: Secondary | ICD-10-CM | POA: Diagnosis not present

## 2021-02-05 DIAGNOSIS — I130 Hypertensive heart and chronic kidney disease with heart failure and stage 1 through stage 4 chronic kidney disease, or unspecified chronic kidney disease: Secondary | ICD-10-CM | POA: Diagnosis not present

## 2021-02-05 DIAGNOSIS — I739 Peripheral vascular disease, unspecified: Secondary | ICD-10-CM | POA: Diagnosis not present

## 2021-02-05 DIAGNOSIS — I272 Pulmonary hypertension, unspecified: Secondary | ICD-10-CM | POA: Diagnosis not present

## 2021-02-05 DIAGNOSIS — I5032 Chronic diastolic (congestive) heart failure: Secondary | ICD-10-CM | POA: Diagnosis not present

## 2021-02-05 DIAGNOSIS — G8929 Other chronic pain: Secondary | ICD-10-CM | POA: Diagnosis not present

## 2021-02-06 DIAGNOSIS — I130 Hypertensive heart and chronic kidney disease with heart failure and stage 1 through stage 4 chronic kidney disease, or unspecified chronic kidney disease: Secondary | ICD-10-CM | POA: Diagnosis not present

## 2021-02-06 DIAGNOSIS — G8929 Other chronic pain: Secondary | ICD-10-CM | POA: Diagnosis not present

## 2021-02-06 DIAGNOSIS — E441 Mild protein-calorie malnutrition: Secondary | ICD-10-CM | POA: Diagnosis not present

## 2021-02-06 DIAGNOSIS — I739 Peripheral vascular disease, unspecified: Secondary | ICD-10-CM | POA: Diagnosis not present

## 2021-02-06 DIAGNOSIS — I272 Pulmonary hypertension, unspecified: Secondary | ICD-10-CM | POA: Diagnosis not present

## 2021-02-06 DIAGNOSIS — F419 Anxiety disorder, unspecified: Secondary | ICD-10-CM | POA: Diagnosis not present

## 2021-02-06 DIAGNOSIS — F329 Major depressive disorder, single episode, unspecified: Secondary | ICD-10-CM | POA: Diagnosis not present

## 2021-02-06 DIAGNOSIS — I5032 Chronic diastolic (congestive) heart failure: Secondary | ICD-10-CM | POA: Diagnosis not present

## 2021-02-06 DIAGNOSIS — N1832 Chronic kidney disease, stage 3b: Secondary | ICD-10-CM | POA: Diagnosis not present

## 2021-02-09 DIAGNOSIS — F419 Anxiety disorder, unspecified: Secondary | ICD-10-CM | POA: Diagnosis not present

## 2021-02-09 DIAGNOSIS — E441 Mild protein-calorie malnutrition: Secondary | ICD-10-CM | POA: Diagnosis not present

## 2021-02-09 DIAGNOSIS — I272 Pulmonary hypertension, unspecified: Secondary | ICD-10-CM | POA: Diagnosis not present

## 2021-02-09 DIAGNOSIS — N1832 Chronic kidney disease, stage 3b: Secondary | ICD-10-CM | POA: Diagnosis not present

## 2021-02-09 DIAGNOSIS — I130 Hypertensive heart and chronic kidney disease with heart failure and stage 1 through stage 4 chronic kidney disease, or unspecified chronic kidney disease: Secondary | ICD-10-CM | POA: Diagnosis not present

## 2021-02-09 DIAGNOSIS — G8929 Other chronic pain: Secondary | ICD-10-CM | POA: Diagnosis not present

## 2021-02-09 DIAGNOSIS — F329 Major depressive disorder, single episode, unspecified: Secondary | ICD-10-CM | POA: Diagnosis not present

## 2021-02-09 DIAGNOSIS — I5032 Chronic diastolic (congestive) heart failure: Secondary | ICD-10-CM | POA: Diagnosis not present

## 2021-02-09 DIAGNOSIS — I739 Peripheral vascular disease, unspecified: Secondary | ICD-10-CM | POA: Diagnosis not present

## 2021-02-12 DIAGNOSIS — I5032 Chronic diastolic (congestive) heart failure: Secondary | ICD-10-CM | POA: Diagnosis not present

## 2021-02-12 DIAGNOSIS — G8929 Other chronic pain: Secondary | ICD-10-CM | POA: Diagnosis not present

## 2021-02-12 DIAGNOSIS — I130 Hypertensive heart and chronic kidney disease with heart failure and stage 1 through stage 4 chronic kidney disease, or unspecified chronic kidney disease: Secondary | ICD-10-CM | POA: Diagnosis not present

## 2021-02-12 DIAGNOSIS — I739 Peripheral vascular disease, unspecified: Secondary | ICD-10-CM | POA: Diagnosis not present

## 2021-02-12 DIAGNOSIS — F329 Major depressive disorder, single episode, unspecified: Secondary | ICD-10-CM | POA: Diagnosis not present

## 2021-02-12 DIAGNOSIS — E441 Mild protein-calorie malnutrition: Secondary | ICD-10-CM | POA: Diagnosis not present

## 2021-02-12 DIAGNOSIS — N1832 Chronic kidney disease, stage 3b: Secondary | ICD-10-CM | POA: Diagnosis not present

## 2021-02-12 DIAGNOSIS — I272 Pulmonary hypertension, unspecified: Secondary | ICD-10-CM | POA: Diagnosis not present

## 2021-02-12 DIAGNOSIS — F419 Anxiety disorder, unspecified: Secondary | ICD-10-CM | POA: Diagnosis not present

## 2021-02-13 DIAGNOSIS — F419 Anxiety disorder, unspecified: Secondary | ICD-10-CM | POA: Diagnosis not present

## 2021-02-13 DIAGNOSIS — I739 Peripheral vascular disease, unspecified: Secondary | ICD-10-CM | POA: Diagnosis not present

## 2021-02-13 DIAGNOSIS — N1832 Chronic kidney disease, stage 3b: Secondary | ICD-10-CM | POA: Diagnosis not present

## 2021-02-13 DIAGNOSIS — I5032 Chronic diastolic (congestive) heart failure: Secondary | ICD-10-CM | POA: Diagnosis not present

## 2021-02-13 DIAGNOSIS — I130 Hypertensive heart and chronic kidney disease with heart failure and stage 1 through stage 4 chronic kidney disease, or unspecified chronic kidney disease: Secondary | ICD-10-CM | POA: Diagnosis not present

## 2021-02-13 DIAGNOSIS — E441 Mild protein-calorie malnutrition: Secondary | ICD-10-CM | POA: Diagnosis not present

## 2021-02-13 DIAGNOSIS — I272 Pulmonary hypertension, unspecified: Secondary | ICD-10-CM | POA: Diagnosis not present

## 2021-02-13 DIAGNOSIS — G8929 Other chronic pain: Secondary | ICD-10-CM | POA: Diagnosis not present

## 2021-02-13 DIAGNOSIS — F329 Major depressive disorder, single episode, unspecified: Secondary | ICD-10-CM | POA: Diagnosis not present

## 2021-02-16 DIAGNOSIS — I10 Essential (primary) hypertension: Secondary | ICD-10-CM | POA: Diagnosis not present

## 2021-02-16 DIAGNOSIS — M810 Age-related osteoporosis without current pathological fracture: Secondary | ICD-10-CM | POA: Diagnosis not present

## 2021-02-16 DIAGNOSIS — R32 Unspecified urinary incontinence: Secondary | ICD-10-CM | POA: Diagnosis not present

## 2021-02-16 DIAGNOSIS — G8929 Other chronic pain: Secondary | ICD-10-CM | POA: Diagnosis not present

## 2021-02-16 DIAGNOSIS — R609 Edema, unspecified: Secondary | ICD-10-CM | POA: Diagnosis not present

## 2021-02-16 DIAGNOSIS — F419 Anxiety disorder, unspecified: Secondary | ICD-10-CM | POA: Diagnosis not present

## 2021-02-16 DIAGNOSIS — M1990 Unspecified osteoarthritis, unspecified site: Secondary | ICD-10-CM | POA: Diagnosis not present

## 2021-02-16 DIAGNOSIS — Z79810 Long term (current) use of selective estrogen receptor modulators (SERMs): Secondary | ICD-10-CM | POA: Diagnosis not present

## 2021-02-16 DIAGNOSIS — Z7409 Other reduced mobility: Secondary | ICD-10-CM | POA: Diagnosis not present

## 2021-02-17 DIAGNOSIS — I739 Peripheral vascular disease, unspecified: Secondary | ICD-10-CM | POA: Diagnosis not present

## 2021-02-17 DIAGNOSIS — I272 Pulmonary hypertension, unspecified: Secondary | ICD-10-CM | POA: Diagnosis not present

## 2021-02-17 DIAGNOSIS — I5032 Chronic diastolic (congestive) heart failure: Secondary | ICD-10-CM | POA: Diagnosis not present

## 2021-02-17 DIAGNOSIS — E441 Mild protein-calorie malnutrition: Secondary | ICD-10-CM | POA: Diagnosis not present

## 2021-02-17 DIAGNOSIS — G8929 Other chronic pain: Secondary | ICD-10-CM | POA: Diagnosis not present

## 2021-02-17 DIAGNOSIS — F329 Major depressive disorder, single episode, unspecified: Secondary | ICD-10-CM | POA: Diagnosis not present

## 2021-02-17 DIAGNOSIS — N1832 Chronic kidney disease, stage 3b: Secondary | ICD-10-CM | POA: Diagnosis not present

## 2021-02-17 DIAGNOSIS — F419 Anxiety disorder, unspecified: Secondary | ICD-10-CM | POA: Diagnosis not present

## 2021-02-17 DIAGNOSIS — I130 Hypertensive heart and chronic kidney disease with heart failure and stage 1 through stage 4 chronic kidney disease, or unspecified chronic kidney disease: Secondary | ICD-10-CM | POA: Diagnosis not present

## 2021-02-18 DIAGNOSIS — F419 Anxiety disorder, unspecified: Secondary | ICD-10-CM | POA: Diagnosis not present

## 2021-02-18 DIAGNOSIS — N1832 Chronic kidney disease, stage 3b: Secondary | ICD-10-CM | POA: Diagnosis not present

## 2021-02-18 DIAGNOSIS — I272 Pulmonary hypertension, unspecified: Secondary | ICD-10-CM | POA: Diagnosis not present

## 2021-02-18 DIAGNOSIS — I739 Peripheral vascular disease, unspecified: Secondary | ICD-10-CM | POA: Diagnosis not present

## 2021-02-18 DIAGNOSIS — E441 Mild protein-calorie malnutrition: Secondary | ICD-10-CM | POA: Diagnosis not present

## 2021-02-18 DIAGNOSIS — F329 Major depressive disorder, single episode, unspecified: Secondary | ICD-10-CM | POA: Diagnosis not present

## 2021-02-18 DIAGNOSIS — I5032 Chronic diastolic (congestive) heart failure: Secondary | ICD-10-CM | POA: Diagnosis not present

## 2021-02-18 DIAGNOSIS — G8929 Other chronic pain: Secondary | ICD-10-CM | POA: Diagnosis not present

## 2021-02-18 DIAGNOSIS — I130 Hypertensive heart and chronic kidney disease with heart failure and stage 1 through stage 4 chronic kidney disease, or unspecified chronic kidney disease: Secondary | ICD-10-CM | POA: Diagnosis not present

## 2021-02-20 DIAGNOSIS — I130 Hypertensive heart and chronic kidney disease with heart failure and stage 1 through stage 4 chronic kidney disease, or unspecified chronic kidney disease: Secondary | ICD-10-CM | POA: Diagnosis not present

## 2021-02-20 DIAGNOSIS — F329 Major depressive disorder, single episode, unspecified: Secondary | ICD-10-CM | POA: Diagnosis not present

## 2021-02-20 DIAGNOSIS — F419 Anxiety disorder, unspecified: Secondary | ICD-10-CM | POA: Diagnosis not present

## 2021-02-20 DIAGNOSIS — E441 Mild protein-calorie malnutrition: Secondary | ICD-10-CM | POA: Diagnosis not present

## 2021-02-20 DIAGNOSIS — N1832 Chronic kidney disease, stage 3b: Secondary | ICD-10-CM | POA: Diagnosis not present

## 2021-02-20 DIAGNOSIS — M6281 Muscle weakness (generalized): Secondary | ICD-10-CM | POA: Diagnosis not present

## 2021-02-20 DIAGNOSIS — I272 Pulmonary hypertension, unspecified: Secondary | ICD-10-CM | POA: Diagnosis not present

## 2021-02-20 DIAGNOSIS — I739 Peripheral vascular disease, unspecified: Secondary | ICD-10-CM | POA: Diagnosis not present

## 2021-02-20 DIAGNOSIS — I5032 Chronic diastolic (congestive) heart failure: Secondary | ICD-10-CM | POA: Diagnosis not present

## 2021-02-20 DIAGNOSIS — G8929 Other chronic pain: Secondary | ICD-10-CM | POA: Diagnosis not present

## 2021-02-21 DIAGNOSIS — I130 Hypertensive heart and chronic kidney disease with heart failure and stage 1 through stage 4 chronic kidney disease, or unspecified chronic kidney disease: Secondary | ICD-10-CM | POA: Diagnosis not present

## 2021-02-21 DIAGNOSIS — I872 Venous insufficiency (chronic) (peripheral): Secondary | ICD-10-CM | POA: Diagnosis not present

## 2021-02-21 DIAGNOSIS — G8929 Other chronic pain: Secondary | ICD-10-CM | POA: Diagnosis not present

## 2021-02-21 DIAGNOSIS — F419 Anxiety disorder, unspecified: Secondary | ICD-10-CM | POA: Diagnosis not present

## 2021-02-21 DIAGNOSIS — F329 Major depressive disorder, single episode, unspecified: Secondary | ICD-10-CM | POA: Diagnosis not present

## 2021-02-21 DIAGNOSIS — I739 Peripheral vascular disease, unspecified: Secondary | ICD-10-CM | POA: Diagnosis not present

## 2021-02-21 DIAGNOSIS — I5042 Chronic combined systolic (congestive) and diastolic (congestive) heart failure: Secondary | ICD-10-CM | POA: Diagnosis not present

## 2021-02-21 DIAGNOSIS — N1832 Chronic kidney disease, stage 3b: Secondary | ICD-10-CM | POA: Diagnosis not present

## 2021-02-21 DIAGNOSIS — I272 Pulmonary hypertension, unspecified: Secondary | ICD-10-CM | POA: Diagnosis not present

## 2021-02-25 DIAGNOSIS — G8929 Other chronic pain: Secondary | ICD-10-CM | POA: Diagnosis not present

## 2021-02-25 DIAGNOSIS — F419 Anxiety disorder, unspecified: Secondary | ICD-10-CM | POA: Diagnosis not present

## 2021-02-25 DIAGNOSIS — I739 Peripheral vascular disease, unspecified: Secondary | ICD-10-CM | POA: Diagnosis not present

## 2021-02-25 DIAGNOSIS — I5042 Chronic combined systolic (congestive) and diastolic (congestive) heart failure: Secondary | ICD-10-CM | POA: Diagnosis not present

## 2021-02-25 DIAGNOSIS — N1832 Chronic kidney disease, stage 3b: Secondary | ICD-10-CM | POA: Diagnosis not present

## 2021-02-25 DIAGNOSIS — F329 Major depressive disorder, single episode, unspecified: Secondary | ICD-10-CM | POA: Diagnosis not present

## 2021-02-25 DIAGNOSIS — I130 Hypertensive heart and chronic kidney disease with heart failure and stage 1 through stage 4 chronic kidney disease, or unspecified chronic kidney disease: Secondary | ICD-10-CM | POA: Diagnosis not present

## 2021-02-25 DIAGNOSIS — I272 Pulmonary hypertension, unspecified: Secondary | ICD-10-CM | POA: Diagnosis not present

## 2021-02-25 DIAGNOSIS — I872 Venous insufficiency (chronic) (peripheral): Secondary | ICD-10-CM | POA: Diagnosis not present

## 2021-02-27 DIAGNOSIS — I739 Peripheral vascular disease, unspecified: Secondary | ICD-10-CM | POA: Diagnosis not present

## 2021-02-27 DIAGNOSIS — I5042 Chronic combined systolic (congestive) and diastolic (congestive) heart failure: Secondary | ICD-10-CM | POA: Diagnosis not present

## 2021-02-27 DIAGNOSIS — I872 Venous insufficiency (chronic) (peripheral): Secondary | ICD-10-CM | POA: Diagnosis not present

## 2021-02-27 DIAGNOSIS — I130 Hypertensive heart and chronic kidney disease with heart failure and stage 1 through stage 4 chronic kidney disease, or unspecified chronic kidney disease: Secondary | ICD-10-CM | POA: Diagnosis not present

## 2021-02-27 DIAGNOSIS — I272 Pulmonary hypertension, unspecified: Secondary | ICD-10-CM | POA: Diagnosis not present

## 2021-02-27 DIAGNOSIS — G8929 Other chronic pain: Secondary | ICD-10-CM | POA: Diagnosis not present

## 2021-02-27 DIAGNOSIS — N1832 Chronic kidney disease, stage 3b: Secondary | ICD-10-CM | POA: Diagnosis not present

## 2021-02-27 DIAGNOSIS — F419 Anxiety disorder, unspecified: Secondary | ICD-10-CM | POA: Diagnosis not present

## 2021-02-27 DIAGNOSIS — F329 Major depressive disorder, single episode, unspecified: Secondary | ICD-10-CM | POA: Diagnosis not present

## 2021-02-28 DIAGNOSIS — Z23 Encounter for immunization: Secondary | ICD-10-CM | POA: Diagnosis not present

## 2021-03-03 DIAGNOSIS — I272 Pulmonary hypertension, unspecified: Secondary | ICD-10-CM | POA: Diagnosis not present

## 2021-03-03 DIAGNOSIS — I872 Venous insufficiency (chronic) (peripheral): Secondary | ICD-10-CM | POA: Diagnosis not present

## 2021-03-03 DIAGNOSIS — I130 Hypertensive heart and chronic kidney disease with heart failure and stage 1 through stage 4 chronic kidney disease, or unspecified chronic kidney disease: Secondary | ICD-10-CM | POA: Diagnosis not present

## 2021-03-03 DIAGNOSIS — I739 Peripheral vascular disease, unspecified: Secondary | ICD-10-CM | POA: Diagnosis not present

## 2021-03-03 DIAGNOSIS — G8929 Other chronic pain: Secondary | ICD-10-CM | POA: Diagnosis not present

## 2021-03-03 DIAGNOSIS — F329 Major depressive disorder, single episode, unspecified: Secondary | ICD-10-CM | POA: Diagnosis not present

## 2021-03-03 DIAGNOSIS — I5042 Chronic combined systolic (congestive) and diastolic (congestive) heart failure: Secondary | ICD-10-CM | POA: Diagnosis not present

## 2021-03-03 DIAGNOSIS — F419 Anxiety disorder, unspecified: Secondary | ICD-10-CM | POA: Diagnosis not present

## 2021-03-03 DIAGNOSIS — N1832 Chronic kidney disease, stage 3b: Secondary | ICD-10-CM | POA: Diagnosis not present

## 2021-03-04 DIAGNOSIS — F329 Major depressive disorder, single episode, unspecified: Secondary | ICD-10-CM | POA: Diagnosis not present

## 2021-03-04 DIAGNOSIS — N1832 Chronic kidney disease, stage 3b: Secondary | ICD-10-CM | POA: Diagnosis not present

## 2021-03-04 DIAGNOSIS — I739 Peripheral vascular disease, unspecified: Secondary | ICD-10-CM | POA: Diagnosis not present

## 2021-03-04 DIAGNOSIS — G8929 Other chronic pain: Secondary | ICD-10-CM | POA: Diagnosis not present

## 2021-03-04 DIAGNOSIS — I872 Venous insufficiency (chronic) (peripheral): Secondary | ICD-10-CM | POA: Diagnosis not present

## 2021-03-04 DIAGNOSIS — F419 Anxiety disorder, unspecified: Secondary | ICD-10-CM | POA: Diagnosis not present

## 2021-03-04 DIAGNOSIS — I130 Hypertensive heart and chronic kidney disease with heart failure and stage 1 through stage 4 chronic kidney disease, or unspecified chronic kidney disease: Secondary | ICD-10-CM | POA: Diagnosis not present

## 2021-03-04 DIAGNOSIS — I272 Pulmonary hypertension, unspecified: Secondary | ICD-10-CM | POA: Diagnosis not present

## 2021-03-04 DIAGNOSIS — I5042 Chronic combined systolic (congestive) and diastolic (congestive) heart failure: Secondary | ICD-10-CM | POA: Diagnosis not present

## 2021-03-10 DIAGNOSIS — G8929 Other chronic pain: Secondary | ICD-10-CM | POA: Diagnosis not present

## 2021-03-10 DIAGNOSIS — N1832 Chronic kidney disease, stage 3b: Secondary | ICD-10-CM | POA: Diagnosis not present

## 2021-03-10 DIAGNOSIS — I272 Pulmonary hypertension, unspecified: Secondary | ICD-10-CM | POA: Diagnosis not present

## 2021-03-10 DIAGNOSIS — F419 Anxiety disorder, unspecified: Secondary | ICD-10-CM | POA: Diagnosis not present

## 2021-03-10 DIAGNOSIS — I872 Venous insufficiency (chronic) (peripheral): Secondary | ICD-10-CM | POA: Diagnosis not present

## 2021-03-10 DIAGNOSIS — I739 Peripheral vascular disease, unspecified: Secondary | ICD-10-CM | POA: Diagnosis not present

## 2021-03-10 DIAGNOSIS — I5042 Chronic combined systolic (congestive) and diastolic (congestive) heart failure: Secondary | ICD-10-CM | POA: Diagnosis not present

## 2021-03-10 DIAGNOSIS — I130 Hypertensive heart and chronic kidney disease with heart failure and stage 1 through stage 4 chronic kidney disease, or unspecified chronic kidney disease: Secondary | ICD-10-CM | POA: Diagnosis not present

## 2021-03-10 DIAGNOSIS — F329 Major depressive disorder, single episode, unspecified: Secondary | ICD-10-CM | POA: Diagnosis not present

## 2021-03-13 DIAGNOSIS — I272 Pulmonary hypertension, unspecified: Secondary | ICD-10-CM | POA: Diagnosis not present

## 2021-03-13 DIAGNOSIS — I739 Peripheral vascular disease, unspecified: Secondary | ICD-10-CM | POA: Diagnosis not present

## 2021-03-13 DIAGNOSIS — G8929 Other chronic pain: Secondary | ICD-10-CM | POA: Diagnosis not present

## 2021-03-13 DIAGNOSIS — I5042 Chronic combined systolic (congestive) and diastolic (congestive) heart failure: Secondary | ICD-10-CM | POA: Diagnosis not present

## 2021-03-13 DIAGNOSIS — F419 Anxiety disorder, unspecified: Secondary | ICD-10-CM | POA: Diagnosis not present

## 2021-03-13 DIAGNOSIS — I872 Venous insufficiency (chronic) (peripheral): Secondary | ICD-10-CM | POA: Diagnosis not present

## 2021-03-13 DIAGNOSIS — I130 Hypertensive heart and chronic kidney disease with heart failure and stage 1 through stage 4 chronic kidney disease, or unspecified chronic kidney disease: Secondary | ICD-10-CM | POA: Diagnosis not present

## 2021-03-13 DIAGNOSIS — N1832 Chronic kidney disease, stage 3b: Secondary | ICD-10-CM | POA: Diagnosis not present

## 2021-03-13 DIAGNOSIS — F329 Major depressive disorder, single episode, unspecified: Secondary | ICD-10-CM | POA: Diagnosis not present

## 2021-03-18 DIAGNOSIS — F329 Major depressive disorder, single episode, unspecified: Secondary | ICD-10-CM | POA: Diagnosis not present

## 2021-03-18 DIAGNOSIS — I272 Pulmonary hypertension, unspecified: Secondary | ICD-10-CM | POA: Diagnosis not present

## 2021-03-18 DIAGNOSIS — I5042 Chronic combined systolic (congestive) and diastolic (congestive) heart failure: Secondary | ICD-10-CM | POA: Diagnosis not present

## 2021-03-18 DIAGNOSIS — N1832 Chronic kidney disease, stage 3b: Secondary | ICD-10-CM | POA: Diagnosis not present

## 2021-03-18 DIAGNOSIS — G8929 Other chronic pain: Secondary | ICD-10-CM | POA: Diagnosis not present

## 2021-03-18 DIAGNOSIS — I739 Peripheral vascular disease, unspecified: Secondary | ICD-10-CM | POA: Diagnosis not present

## 2021-03-18 DIAGNOSIS — F419 Anxiety disorder, unspecified: Secondary | ICD-10-CM | POA: Diagnosis not present

## 2021-03-18 DIAGNOSIS — I130 Hypertensive heart and chronic kidney disease with heart failure and stage 1 through stage 4 chronic kidney disease, or unspecified chronic kidney disease: Secondary | ICD-10-CM | POA: Diagnosis not present

## 2021-03-18 DIAGNOSIS — I872 Venous insufficiency (chronic) (peripheral): Secondary | ICD-10-CM | POA: Diagnosis not present

## 2021-03-22 DIAGNOSIS — M6281 Muscle weakness (generalized): Secondary | ICD-10-CM | POA: Diagnosis not present

## 2021-05-01 DIAGNOSIS — N3281 Overactive bladder: Secondary | ICD-10-CM | POA: Diagnosis not present

## 2021-05-01 DIAGNOSIS — M25561 Pain in right knee: Secondary | ICD-10-CM | POA: Diagnosis not present

## 2021-05-01 DIAGNOSIS — M25562 Pain in left knee: Secondary | ICD-10-CM | POA: Diagnosis not present

## 2021-05-01 DIAGNOSIS — I5042 Chronic combined systolic (congestive) and diastolic (congestive) heart failure: Secondary | ICD-10-CM | POA: Diagnosis not present

## 2021-05-01 DIAGNOSIS — I872 Venous insufficiency (chronic) (peripheral): Secondary | ICD-10-CM | POA: Diagnosis not present

## 2021-05-01 DIAGNOSIS — I10 Essential (primary) hypertension: Secondary | ICD-10-CM | POA: Diagnosis not present

## 2021-05-27 DIAGNOSIS — Z96653 Presence of artificial knee joint, bilateral: Secondary | ICD-10-CM | POA: Diagnosis not present

## 2021-05-29 DIAGNOSIS — M1990 Unspecified osteoarthritis, unspecified site: Secondary | ICD-10-CM | POA: Diagnosis not present

## 2021-05-29 DIAGNOSIS — I10 Essential (primary) hypertension: Secondary | ICD-10-CM | POA: Diagnosis not present

## 2021-05-29 DIAGNOSIS — E786 Lipoprotein deficiency: Secondary | ICD-10-CM | POA: Diagnosis not present

## 2021-05-29 DIAGNOSIS — I5043 Acute on chronic combined systolic (congestive) and diastolic (congestive) heart failure: Secondary | ICD-10-CM | POA: Diagnosis not present

## 2021-06-04 ENCOUNTER — Other Ambulatory Visit: Payer: Self-pay | Admitting: Adult Health

## 2021-08-28 DIAGNOSIS — E786 Lipoprotein deficiency: Secondary | ICD-10-CM | POA: Diagnosis not present

## 2021-08-28 DIAGNOSIS — I5043 Acute on chronic combined systolic (congestive) and diastolic (congestive) heart failure: Secondary | ICD-10-CM | POA: Diagnosis not present

## 2021-08-28 DIAGNOSIS — I10 Essential (primary) hypertension: Secondary | ICD-10-CM | POA: Diagnosis not present

## 2021-08-28 DIAGNOSIS — M1990 Unspecified osteoarthritis, unspecified site: Secondary | ICD-10-CM | POA: Diagnosis not present

## 2021-09-22 DIAGNOSIS — I5042 Chronic combined systolic (congestive) and diastolic (congestive) heart failure: Secondary | ICD-10-CM | POA: Diagnosis not present

## 2021-09-22 DIAGNOSIS — I872 Venous insufficiency (chronic) (peripheral): Secondary | ICD-10-CM | POA: Diagnosis not present

## 2021-09-22 DIAGNOSIS — R32 Unspecified urinary incontinence: Secondary | ICD-10-CM | POA: Diagnosis not present

## 2021-09-22 DIAGNOSIS — I10 Essential (primary) hypertension: Secondary | ICD-10-CM | POA: Diagnosis not present

## 2021-09-22 DIAGNOSIS — I272 Pulmonary hypertension, unspecified: Secondary | ICD-10-CM | POA: Diagnosis not present

## 2021-09-22 DIAGNOSIS — I83012 Varicose veins of right lower extremity with ulcer of calf: Secondary | ICD-10-CM | POA: Diagnosis not present

## 2021-09-22 DIAGNOSIS — L97211 Non-pressure chronic ulcer of right calf limited to breakdown of skin: Secondary | ICD-10-CM | POA: Diagnosis not present

## 2021-10-21 DIAGNOSIS — E785 Hyperlipidemia, unspecified: Secondary | ICD-10-CM | POA: Diagnosis not present

## 2021-10-21 DIAGNOSIS — M1990 Unspecified osteoarthritis, unspecified site: Secondary | ICD-10-CM | POA: Diagnosis not present

## 2021-10-21 DIAGNOSIS — I5043 Acute on chronic combined systolic (congestive) and diastolic (congestive) heart failure: Secondary | ICD-10-CM | POA: Diagnosis not present

## 2021-10-21 DIAGNOSIS — F419 Anxiety disorder, unspecified: Secondary | ICD-10-CM | POA: Diagnosis not present

## 2021-10-21 DIAGNOSIS — I272 Pulmonary hypertension, unspecified: Secondary | ICD-10-CM | POA: Diagnosis not present

## 2021-10-21 DIAGNOSIS — R32 Unspecified urinary incontinence: Secondary | ICD-10-CM | POA: Diagnosis not present

## 2021-10-21 DIAGNOSIS — I10 Essential (primary) hypertension: Secondary | ICD-10-CM | POA: Diagnosis not present

## 2021-10-21 DIAGNOSIS — L97211 Non-pressure chronic ulcer of right calf limited to breakdown of skin: Secondary | ICD-10-CM | POA: Diagnosis not present

## 2021-10-21 DIAGNOSIS — I83012 Varicose veins of right lower extremity with ulcer of calf: Secondary | ICD-10-CM | POA: Diagnosis not present

## 2021-10-27 DIAGNOSIS — I272 Pulmonary hypertension, unspecified: Secondary | ICD-10-CM | POA: Diagnosis not present

## 2021-11-27 DIAGNOSIS — I5042 Chronic combined systolic (congestive) and diastolic (congestive) heart failure: Secondary | ICD-10-CM | POA: Diagnosis not present

## 2021-11-27 DIAGNOSIS — I10 Essential (primary) hypertension: Secondary | ICD-10-CM | POA: Diagnosis not present

## 2021-12-08 DIAGNOSIS — M810 Age-related osteoporosis without current pathological fracture: Secondary | ICD-10-CM | POA: Diagnosis not present

## 2021-12-08 DIAGNOSIS — R0609 Other forms of dyspnea: Secondary | ICD-10-CM | POA: Diagnosis not present

## 2021-12-08 DIAGNOSIS — R0600 Dyspnea, unspecified: Secondary | ICD-10-CM | POA: Diagnosis not present

## 2021-12-08 DIAGNOSIS — I872 Venous insufficiency (chronic) (peripheral): Secondary | ICD-10-CM | POA: Diagnosis not present

## 2021-12-08 DIAGNOSIS — I5042 Chronic combined systolic (congestive) and diastolic (congestive) heart failure: Secondary | ICD-10-CM | POA: Diagnosis not present

## 2021-12-08 DIAGNOSIS — M1990 Unspecified osteoarthritis, unspecified site: Secondary | ICD-10-CM | POA: Diagnosis not present

## 2021-12-08 DIAGNOSIS — I10 Essential (primary) hypertension: Secondary | ICD-10-CM | POA: Diagnosis not present

## 2021-12-08 DIAGNOSIS — R32 Unspecified urinary incontinence: Secondary | ICD-10-CM | POA: Diagnosis not present

## 2021-12-08 DIAGNOSIS — L97211 Non-pressure chronic ulcer of right calf limited to breakdown of skin: Secondary | ICD-10-CM | POA: Diagnosis not present

## 2021-12-24 ENCOUNTER — Emergency Department (HOSPITAL_COMMUNITY): Payer: Medicare PPO

## 2021-12-24 ENCOUNTER — Other Ambulatory Visit: Payer: Self-pay

## 2021-12-24 ENCOUNTER — Observation Stay (HOSPITAL_COMMUNITY)
Admission: EM | Admit: 2021-12-24 | Discharge: 2021-12-26 | Disposition: A | Discharge status: Home / Self Care | Payer: Medicare PPO | Attending: General Surgery | Admitting: General Surgery

## 2021-12-24 ENCOUNTER — Encounter (HOSPITAL_COMMUNITY): Payer: Self-pay | Admitting: Emergency Medicine

## 2021-12-24 DIAGNOSIS — I9589 Other hypotension: Secondary | ICD-10-CM | POA: Diagnosis not present

## 2021-12-24 DIAGNOSIS — I959 Hypotension, unspecified: Secondary | ICD-10-CM | POA: Diagnosis not present

## 2021-12-24 DIAGNOSIS — R918 Other nonspecific abnormal finding of lung field: Secondary | ICD-10-CM | POA: Diagnosis not present

## 2021-12-24 DIAGNOSIS — Z79899 Other long term (current) drug therapy: Secondary | ICD-10-CM | POA: Diagnosis not present

## 2021-12-24 DIAGNOSIS — W010XXA Fall on same level from slipping, tripping and stumbling without subsequent striking against object, initial encounter: Secondary | ICD-10-CM | POA: Diagnosis not present

## 2021-12-24 DIAGNOSIS — M6281 Muscle weakness (generalized): Secondary | ICD-10-CM | POA: Insufficient documentation

## 2021-12-24 DIAGNOSIS — Z7982 Long term (current) use of aspirin: Secondary | ICD-10-CM | POA: Diagnosis not present

## 2021-12-24 DIAGNOSIS — S3993XA Unspecified injury of pelvis, initial encounter: Secondary | ICD-10-CM | POA: Diagnosis not present

## 2021-12-24 DIAGNOSIS — R102 Pelvic and perineal pain: Secondary | ICD-10-CM | POA: Diagnosis not present

## 2021-12-24 DIAGNOSIS — M25569 Pain in unspecified knee: Secondary | ICD-10-CM | POA: Diagnosis not present

## 2021-12-24 DIAGNOSIS — Z602 Problems related to living alone: Secondary | ICD-10-CM | POA: Insufficient documentation

## 2021-12-24 DIAGNOSIS — D62 Acute posthemorrhagic anemia: Secondary | ICD-10-CM | POA: Insufficient documentation

## 2021-12-24 DIAGNOSIS — I700 Atherosclerosis of aorta: Secondary | ICD-10-CM | POA: Diagnosis not present

## 2021-12-24 DIAGNOSIS — Y929 Unspecified place or not applicable: Secondary | ICD-10-CM | POA: Insufficient documentation

## 2021-12-24 DIAGNOSIS — S22028A Other fracture of second thoracic vertebra, initial encounter for closed fracture: Secondary | ICD-10-CM | POA: Insufficient documentation

## 2021-12-24 DIAGNOSIS — Y999 Unspecified external cause status: Secondary | ICD-10-CM | POA: Insufficient documentation

## 2021-12-24 DIAGNOSIS — Z9181 History of falling: Secondary | ICD-10-CM | POA: Insufficient documentation

## 2021-12-24 DIAGNOSIS — M47816 Spondylosis without myelopathy or radiculopathy, lumbar region: Secondary | ICD-10-CM | POA: Diagnosis not present

## 2021-12-24 DIAGNOSIS — M47812 Spondylosis without myelopathy or radiculopathy, cervical region: Secondary | ICD-10-CM | POA: Diagnosis not present

## 2021-12-24 DIAGNOSIS — M79606 Pain in leg, unspecified: Secondary | ICD-10-CM | POA: Diagnosis not present

## 2021-12-24 DIAGNOSIS — Z23 Encounter for immunization: Secondary | ICD-10-CM | POA: Diagnosis not present

## 2021-12-24 DIAGNOSIS — S0181XA Laceration without foreign body of other part of head, initial encounter: Secondary | ICD-10-CM | POA: Insufficient documentation

## 2021-12-24 DIAGNOSIS — Z20822 Contact with and (suspected) exposure to covid-19: Secondary | ICD-10-CM | POA: Diagnosis not present

## 2021-12-24 DIAGNOSIS — S22020A Wedge compression fracture of second thoracic vertebra, initial encounter for closed fracture: Secondary | ICD-10-CM

## 2021-12-24 DIAGNOSIS — R42 Dizziness and giddiness: Secondary | ICD-10-CM | POA: Diagnosis not present

## 2021-12-24 DIAGNOSIS — R001 Bradycardia, unspecified: Secondary | ICD-10-CM | POA: Diagnosis not present

## 2021-12-24 DIAGNOSIS — W19XXXA Unspecified fall, initial encounter: Secondary | ICD-10-CM

## 2021-12-24 DIAGNOSIS — G8929 Other chronic pain: Secondary | ICD-10-CM | POA: Diagnosis not present

## 2021-12-24 DIAGNOSIS — R2681 Unsteadiness on feet: Secondary | ICD-10-CM | POA: Insufficient documentation

## 2021-12-24 DIAGNOSIS — M549 Dorsalgia, unspecified: Secondary | ICD-10-CM | POA: Diagnosis not present

## 2021-12-24 DIAGNOSIS — R2689 Other abnormalities of gait and mobility: Secondary | ICD-10-CM | POA: Insufficient documentation

## 2021-12-24 DIAGNOSIS — W1830XA Fall on same level, unspecified, initial encounter: Secondary | ICD-10-CM | POA: Insufficient documentation

## 2021-12-24 DIAGNOSIS — S299XXA Unspecified injury of thorax, initial encounter: Secondary | ICD-10-CM | POA: Diagnosis not present

## 2021-12-24 DIAGNOSIS — S199XXA Unspecified injury of neck, initial encounter: Secondary | ICD-10-CM | POA: Diagnosis not present

## 2021-12-24 DIAGNOSIS — R58 Hemorrhage, not elsewhere classified: Secondary | ICD-10-CM | POA: Diagnosis not present

## 2021-12-24 DIAGNOSIS — G319 Degenerative disease of nervous system, unspecified: Secondary | ICD-10-CM | POA: Diagnosis not present

## 2021-12-24 DIAGNOSIS — S0990XA Unspecified injury of head, initial encounter: Secondary | ICD-10-CM | POA: Diagnosis not present

## 2021-12-24 DIAGNOSIS — S22029A Unspecified fracture of second thoracic vertebra, initial encounter for closed fracture: Secondary | ICD-10-CM | POA: Diagnosis present

## 2021-12-24 DIAGNOSIS — Y939 Activity, unspecified: Secondary | ICD-10-CM | POA: Insufficient documentation

## 2021-12-24 LAB — COMPREHENSIVE METABOLIC PANEL
ALT: 20 U/L (ref 0–44)
AST: 31 U/L (ref 15–41)
Albumin: 2.7 g/dL — ABNORMAL LOW (ref 3.5–5.0)
Alkaline Phosphatase: 71 U/L (ref 38–126)
Anion gap: 8 (ref 5–15)
BUN: 41 mg/dL — ABNORMAL HIGH (ref 8–23)
CO2: 23 mmol/L (ref 22–32)
Calcium: 8.5 mg/dL — ABNORMAL LOW (ref 8.9–10.3)
Chloride: 108 mmol/L (ref 98–111)
Creatinine, Ser: 1.28 mg/dL — ABNORMAL HIGH (ref 0.44–1.00)
GFR, Estimated: 38 mL/min — ABNORMAL LOW (ref >60)
Glucose, Bld: 139 mg/dL — ABNORMAL HIGH (ref 70–99)
Potassium: 4.1 mmol/L (ref 3.5–5.1)
Sodium: 139 mmol/L (ref 135–145)
Total Bilirubin: 0.7 mg/dL (ref 0.3–1.2)
Total Protein: 5.3 g/dL — ABNORMAL LOW (ref 6.5–8.1)

## 2021-12-24 LAB — LACTIC ACID, PLASMA: Lactic Acid, Venous: 3.9 mmol/L (ref 0.5–1.9)

## 2021-12-24 LAB — CBC
HCT: 33.2 % — ABNORMAL LOW (ref 36.0–46.0)
Hemoglobin: 10.9 g/dL — ABNORMAL LOW (ref 12.0–15.0)
MCH: 32.4 pg (ref 26.0–34.0)
MCHC: 32.8 g/dL (ref 30.0–36.0)
MCV: 98.8 fL (ref 80.0–100.0)
Platelets: 169 10*3/uL (ref 150–400)
RBC: 3.36 MIL/uL — ABNORMAL LOW (ref 3.87–5.11)
RDW: 13.7 % (ref 11.5–15.5)
WBC: 9.5 10*3/uL (ref 4.0–10.5)
nRBC: 0 % (ref 0.0–0.2)

## 2021-12-24 LAB — I-STAT CHEM 8, ED
BUN: 38 mg/dL — ABNORMAL HIGH (ref 8–23)
Calcium, Ion: 1.07 mmol/L — ABNORMAL LOW (ref 1.15–1.40)
Chloride: 105 mmol/L (ref 98–111)
Creatinine, Ser: 1.3 mg/dL — ABNORMAL HIGH (ref 0.44–1.00)
Glucose, Bld: 134 mg/dL — ABNORMAL HIGH (ref 70–99)
HCT: 30 % — ABNORMAL LOW (ref 36.0–46.0)
Hemoglobin: 10.2 g/dL — ABNORMAL LOW (ref 12.0–15.0)
Potassium: 4.2 mmol/L (ref 3.5–5.1)
Sodium: 140 mmol/L (ref 135–145)
TCO2: 21 mmol/L — ABNORMAL LOW (ref 22–32)

## 2021-12-24 LAB — RESP PANEL BY RT-PCR (FLU A&B, COVID) ARPGX2
Influenza A by PCR: NEGATIVE
Influenza B by PCR: NEGATIVE
SARS Coronavirus 2 by RT PCR: NEGATIVE

## 2021-12-24 LAB — SAMPLE TO BLOOD BANK

## 2021-12-24 LAB — PROTIME-INR
INR: 1.3 — ABNORMAL HIGH (ref 0.8–1.2)
Prothrombin Time: 16.4 seconds — ABNORMAL HIGH (ref 11.4–15.2)

## 2021-12-24 LAB — ETHANOL: Alcohol, Ethyl (B): <10 mg/dL (ref <10)

## 2021-12-24 MED ORDER — ONDANSETRON HCL 4 MG/2ML IJ SOLN: 4.0000 mg | Freq: Four times a day (QID) | INTRAMUSCULAR | Status: DC | PRN

## 2021-12-24 MED ORDER — ENOXAPARIN SODIUM 30 MG/0.3ML IJ SOSY
30.0000 mg | PREFILLED_SYRINGE | Freq: Two times a day (BID) | INTRAMUSCULAR | Status: DC
Start: 2021-12-26 — End: 2021-12-26
  Administered 2021-12-26: 30 mg via SUBCUTANEOUS
  Filled 2021-12-24: qty 0.3

## 2021-12-24 MED ORDER — SODIUM CHLORIDE 0.9 % IV SOLN
INTRAVENOUS | Status: AC | PRN
  Administered 2021-12-24: 10 mL/h via INTRAVENOUS

## 2021-12-24 MED ORDER — TRAMADOL HCL 50 MG PO TABS: 50.0000 mg | ORAL_TABLET | Freq: Four times a day (QID) | ORAL | Status: DC | PRN

## 2021-12-24 MED ORDER — ONDANSETRON 4 MG PO TBDP: 4.0000 mg | ORAL_TABLET | Freq: Four times a day (QID) | ORAL | Status: DC | PRN

## 2021-12-24 MED ORDER — MORPHINE SULFATE (PF) 2 MG/ML IV SOLN: 2.0000 mg | INTRAVENOUS | Status: DC | PRN

## 2021-12-24 MED ORDER — ACETAMINOPHEN 325 MG PO TABS: 650.0000 mg | ORAL_TABLET | ORAL | Status: DC | PRN

## 2021-12-24 MED ORDER — METOPROLOL TARTRATE 5 MG/5ML IV SOLN: 5.0000 mg | Freq: Four times a day (QID) | INTRAVENOUS | Status: DC | PRN

## 2021-12-24 MED ORDER — POTASSIUM CHLORIDE IN NACL 20-0.9 MEQ/L-% IV SOLN
INTRAVENOUS | Status: DC
  Filled 2021-12-24: qty 1000

## 2021-12-24 MED ORDER — TETANUS-DIPHTH-ACELL PERTUSSIS 5-2.5-18.5 LF-MCG/0.5 IM SUSY
0.5000 mL | PREFILLED_SYRINGE | Freq: Once | INTRAMUSCULAR | Status: AC
Start: 2021-12-24 — End: 2021-12-24
  Administered 2021-12-24: 0.5 mL via INTRAMUSCULAR

## 2021-12-24 NOTE — ED Notes (Signed)
ED TO INPATIENT HANDOFF REPORT    S Name/Age/Gender Susan Pitts 88 y.o. female Room/Bed: TRACC/TRACC  Code Status : Full  From Home  Patient oriented to self   Triage Complete: Triage complete  Chief Complaint Closed T2 fracture (HCC) [S22.029A]  Triage Note Patient arrived with EMS from home fell this evening hypotensive BP= 70 palpated by EMS prior to arrival , no LOC , received NS IV 500 ml prior to arrival , C-collar applied. Patient sustained laceration approx. 1/2" at right forehead with profuse bleeding at scene / controlled at arrival .    Allergies Allergies  Allergen Reactions   Penicillins     Level of Care/Admitting Diagnosis ED Disposition     ED Disposition  Admit   Condition  --   Comment  Hospital Area: [facility] [[id number]]  Level of Care: Med-Surg [16]  May admit patient to [facility] or [facility] if equivalent level of care is available:: No  Covid Evaluation: Asymptomatic - no recent exposure (last 10 days) testing not required  Diagnosis: Closed T2 fracture [facility]) [009381]  Admitting Physician: Violeta Gelinas [[id number]]  Attending Physician: TRAUMA MD [[id number]]  Bed request comments: 6N or 5N  Certification:: I certify this patient will need inpatient services for at least 2 midnights  Estimated Length of Stay: 2          B Medical/Surgery History History reviewed. No pertinent past medical history. Past Surgical History:  Procedure Laterality Date   REPLACEMENT TOTAL KNEE       A IV Location/Drains/Wounds Patient Lines/Drains/Airways Status     Active Line/Drains/Airways     Name Placement date Placement time Site Days   Peripheral IV 12/24/21 18 G Left Wrist 12/24/21  --  Wrist  less than 1   Peripheral IV 12/24/21 20 G Right Forearm 12/24/21  1916  Forearm  less than 1            Intake/Output Last 24 hours No intake or output data in the 24 hours ending 12/24/21 2034  Labs/Imaging Results for orders  placed or performed during the hospital encounter of 12/24/21 (from the past 48 hour(s))  Sample to Blood Bank     Status: None   Collection Time: 12/24/21  7:00 PM  Result Value Ref Range   Blood Bank Specimen SAMPLE AVAILABLE FOR TESTING    Sample Expiration      12/25/2021,2359 Performed at [facility], [address] [place], [place] [address]   Comprehensive metabolic panel     Status: Abnormal   Collection Time: 12/24/21  7:15 PM  Result Value Ref Range   Sodium 139 135 - 145 mmol/L   Potassium 4.1 3.5 - 5.1 mmol/L   Chloride 108 98 - 111 mmol/L   CO2 23 22 - 32 mmol/L   Glucose, Bld 139 (H) 70 - 99 mg/dL    Comment: Glucose reference range applies only to samples taken after fasting for at least 8 hours.   BUN 41 (H) 8 - 23 mg/dL   Creatinine, Ser 7.16 (H) 0.44 - 1.00 mg/dL   Calcium 8.5 (L) 8.9 - 10.3 mg/dL   Total Protein 5.3 (L) 6.5 - 8.1 g/dL   Albumin 2.7 (L) 3.5 - 5.0 g/dL   AST 31 15 - 41 U/L   ALT 20 0 - 44 U/L   Alkaline Phosphatase 71 38 - 126 U/L   Total Bilirubin 0.7 0.3 - 1.2 mg/dL   GFR, Estimated 38 (  L) >60 mL/min    Comment: (NOTE) Calculated using the CKD-EPI Creatinine Equation (2021)    Anion gap 8 5 - 15    Comment: Performed at [facility], [address], [place], [place] [address]  I-Stat Chem 8, ED     Status: Abnormal   Collection Time: 12/24/21  7:15 PM  Result Value Ref Range   Sodium 140 135 - 145 mmol/L   Potassium 4.2 3.5 - 5.1 mmol/L   Chloride 105 98 - 111 mmol/L   BUN 38 (H) 8 - 23 mg/dL   Creatinine, Ser 5.62 (H) 0.44 - 1.00 mg/dL   Glucose, Bld 130 (H) 70 - 99 mg/dL    Comment: Glucose reference range applies only to samples taken after fasting for at least 8 hours.   Calcium, Ion 1.07 (L) 1.15 - 1.40 mmol/L   TCO2 21 (L) 22 - 32 mmol/L   Hemoglobin 10.2 (L) 12.0 - 15.0 g/dL   HCT 86.5 (L) 78.4 - 69.6 %  CBC     Status: Abnormal   Collection Time: 12/24/21  7:15 PM  Result Value Ref Range   WBC 9.5 4.0 - 10.5  K/uL   RBC 3.36 (L) 3.87 - 5.11 MIL/uL   Hemoglobin 10.9 (L) 12.0 - 15.0 g/dL   HCT 29.5 (L) 28.4 - 13.2 %   MCV 98.8 80.0 - 100.0 fL   MCH 32.4 26.0 - 34.0 pg   MCHC 32.8 30.0 - 36.0 g/dL   RDW 44.0 10.2 - 72.5 %   Platelets 169 150 - 400 K/uL   nRBC 0.0 0.0 - 0.2 %    Comment: Performed at [facility], [address], [place], [place] [address]  Ethanol     Status: None   Collection Time: 12/24/21  7:15 PM  Result Value Ref Range   Alcohol, Ethyl (B) <10 <10 mg/dL    Comment: (NOTE) Lowest detectable limit for serum alcohol is 10 mg/dL.  For medical purposes only. Performed at [facility], [address], [place], [place] [address]   Protime-INR     Status: Abnormal   Collection Time: 12/24/21  7:15 PM  Result Value Ref Range   Prothrombin Time 16.4 (H) 11.4 - 15.2 seconds   INR 1.3 (H) 0.8 - 1.2    Comment: (NOTE) INR goal varies based on device and disease states. Performed at [facility], [address], [place], [place] [address]   Lactic acid, plasma     Status: Abnormal   Collection Time: 12/24/21  7:16 PM  Result Value Ref Range   Lactic Acid, Venous 3.9 (HH) 0.5 - 1.9 mmol/L    Comment: CRITICAL RESULT CALLED TO, READ BACK BY AND VERIFIED WITH R.Sansa Alkema,RN @2014  12/24/2021 VANG.J Performed at [facility], [address] [place], [place]    CT HEAD WO CONTRAST  Result Date: 12/24/2021 CLINICAL DATA:  Head trauma, moderate-severe; Polytrauma, blunt EXAM: CT HEAD WITHOUT CONTRAST CT CERVICAL SPINE WITHOUT CONTRAST TECHNIQUE: Multidetector CT imaging of the head and cervical spine was performed following the standard protocol without intravenous contrast. Multiplanar CT image reconstructions of the cervical spine were also generated. RADIATION DOSE REDUCTION: This exam was performed according to the departmental dose-optimization program which includes automated exposure control, adjustment of the mA and/or kV according to  patient size and/or use of iterative reconstruction technique. COMPARISON:  None Available. FINDINGS: CT HEAD FINDINGS Brain: No evidence of acute intracranial hemorrhage or  extra-axial collection.The basal cisterns are patent. No concerning mass effect.The ventricles are normal in size.Scattered subcortical and periventricular white matter hypodensities, nonspecific but likely sequela of chronic small vessel ischemic disease.Mild cerebral atrophy Vascular: Vascular calcifications. No hyperdense vessel. Skull: Negative for skull fracture. Sinuses/Orbits: No acute finding. Other: None. CT CERVICAL SPINE FINDINGS Alignment: No traumatic listhesis. Skull base and vertebrae: There is no acute cervical spine fracture. There is an anterior T2 compression fracture with approximately 10% height loss. Soft tissues and spinal canal: No prevertebral fluid or swelling. No visible canal hematoma. Disc levels: Minimal multilevel degenerative disc disease. Mild multilevel facet arthropathy. C1-C2 degenerative changes. Upper chest: Patchy ground-glass opacities in the lung apices. Other: There is a subcentimeter hypodense left thyroid nodule which requires no follow-up imaging. Patulous esophagus. IMPRESSION: No acute intracranial abnormality. Acute anterior compression fracture of T2 with approximately 10% height loss. No acute fracture in the cervical spine. Patchy ground-glass opacities in the lung apices, likely infectious/inflammatory. Electronically Signed   By: Caprice Renshaw M.D.   On: 12/24/2021 19:51   DG Pelvis Portable  Result Date: 12/24/2021 CLINICAL DATA:  Trauma.  Fall.  Pain. EXAM: PORTABLE PELVIS 1-2 VIEWS COMPARISON:  Abdomen 12/05/2020 FINDINGS: Degenerative changes in both hips, more severe on the right. Patient rotation limits evaluation but no evidence of acute fracture or dislocation in the pelvis or hips. SI joints and symphysis pubis are not displaced. Degenerative changes also demonstrated in the lower  lumbar spine. Soft tissues are unremarkable. IMPRESSION: Degenerative changes in the hips, more severe on the right. No acute displaced fractures identified. Electronically Signed   By: Burman Nieves M.D.   On: 12/24/2021 19:34   DG Chest Port 1 View  Result Date: 12/24/2021 CLINICAL DATA:  Trauma.  Laceration to the right forehead. EXAM: PORTABLE CHEST 1 VIEW COMPARISON:  11/30/2020 FINDINGS: Shallow inspiration. Heart size and pulmonary vascularity are normal. Lungs are clear. Calcified and tortuous aorta. No pleural effusions. No pneumothorax. Mediastinal contours appear intact. Degenerative changes in the spine and shoulders. IMPRESSION: No active disease. Electronically Signed   By: Burman Nieves M.D.   On: 12/24/2021 19:33    Pending Labs Unresulted Labs (From admission, onward)     Start     Ordered   12/24/21 1909  Resp Panel by RT-PCR (Flu A&B, Covid) Anterior Nasal Swab  (Asymptomatic - Covid)  Once,   URGENT        12/24/21 1908   Signed and Held  CBC  Tomorrow morning,   R        Signed and Held   Signed and Held  Basic metabolic panel  Tomorrow morning,   R        Signed and Held            Vitals/Pain Today's Vitals   12/24/21 1908 12/24/21 1915 12/24/21 1929 12/24/21 1930  BP:  (!) 108/58  (!) 132/44  Pulse:  68  (!) 53  Resp:  20  17  Temp:  (!) 96.8 F (36 C)    TempSrc:  Temporal    SpO2:  100%  95%  PainSc: 0-No pain 0-No pain 0-No pain     Isolation Precautions No active isolations  Medications Medications  Tdap (BOOSTRIX) injection 0.5 mL (0.5 mLs Intramuscular Given 12/24/21 1919)  0.9 %  sodium chloride infusion (10 mL/hr Intravenous New Bag/Given 12/24/21 2020)    Mobility : Dan Humphreys

## 2021-12-24 NOTE — ED Notes (Addendum)
Trauma Response Nurse Documentation   Susan Pitts is a 88 y.o. female arriving to [facility] ED via [facility]  On No antithrombotic. Trauma was activated as a Level 1 by Charge RN based on the following trauma criteria Anytime Systolic Blood Pressure < 90. Trauma team at the bedside on patient arrival. Patient cleared for CT by Dr. Janee Morn. Patient to CT with team. GCS 15.  History   History reviewed. No pertinent past medical history.   Past Surgical History:  Procedure Laterality Date   REPLACEMENT TOTAL KNEE         Initial Focused Assessment (If applicable, or please see trauma documentation): See event summary  CT's Completed:   CT Head and CT C-Spine   Interventions:  See event summary  Plan for disposition:  Admission  Consults completed:  none at 1935.  Event Summary: Patient brought in by [facility] EMS from home. The patient experienced a mechanical fall while at home. Patient presents with a laceration to her forehead. Per EMS bleeding initially not controlled, BP 76/palpated. EMS administered 500cc NS bolus, BP up to 130s upon arrival to department. Patient A&Ox4 upon arrival to emergency department, manual BP 108/58. Patient with small laceration to forehead, bleeding contolled upon arrival. No other trauma noted upon assessment. Airway clear, breathing unlabored. Patient placed in c collar via EMS. TRN gave patient Tdap booster, 1L warmed NS. Transported to CT.  20G PIV RFA Trauma labs Tdap booster 1L warmed NS DG chest DG pelvis CT head CT c-spine   Bedside handoff with ED RN Reita Cliche RN.    Leota Sauers  Trauma Response RN  Please call TRN at [phone] for further assistance.

## 2021-12-24 NOTE — ED Notes (Signed)
Transported to CT scan

## 2021-12-24 NOTE — ED Triage Notes (Addendum)
Patient arrived with EMS from home fell this evening hypotensive BP= 70 palpated by EMS prior to arrival , no LOC , received NS IV 500 ml prior to arrival , C-collar applied. Patient sustained laceration approx. 1/2" at right forehead with profuse bleeding at scene / controlled at arrival .

## 2021-12-24 NOTE — ED Provider Notes (Signed)
[facility] EMERGENCY DEPARTMENT Provider Note   CSN: [id number] Arrival date & time: 12/24/21  1905     History  Chief Complaint  Patient presents with   Fall    Level 1 Hypotensive    Susan Pitts is a 88 y.o. female.   Fall  Patient presents after fall.  Came as a level 1 trauma.  Lost her balance and fell forward striking her head.  Walks with a walker at baseline.  Not on blood thinners.  But was hypotensive for EMS.  Reportedly had laceration with arterial bleeding on her right forehead.  Initial difficulty getting bleeding controlled.  Pressure was reportedly in the 70s.  Heart rate also however was in the 70s.  Had been given fluid bolus and blood pressure improved.  Patient states she feels a little weak.  No neck pain.  No headache.  Cervical collar is in place.  Unknown last tetanus.  States it has been a while.  EMS states it was 500 800 cc of blood on the floor in the house.    History reviewed. No pertinent past medical history.  Home Medications Prior to Admission medications   Not on File      Allergies    Penicillins    Review of Systems   Review of Systems  Physical Exam Updated Vital Signs BP (!) 132/44   Pulse (!) 53   Temp (!) 96.8 F (36 C) (Temporal)   Resp 17   SpO2 95%  Physical Exam Vitals reviewed.  HENT:     Head:     Comments: Laceration to right forehead.  At this point bleeding is controlled.  Approximately 1 cm long. Eyes:     Extraocular Movements: Extraocular movements intact.     Pupils: Pupils are equal, round, and reactive to light.  Cardiovascular:     Rate and Rhythm: Normal rate and regular rhythm.  Abdominal:     Tenderness: There is no abdominal tenderness.  Musculoskeletal:        General: No tenderness.     Cervical back: Neck supple. No tenderness.  Skin:    Capillary Refill: Capillary refill takes less than 2 seconds.     Findings: No erythema.  Neurological:     Mental Status: She is alert  and oriented to person, place, and time.  Psychiatric:        Mood and Affect: Mood normal.     ED Results / Procedures / Treatments   Labs (all labs ordered are listed, but only abnormal results are displayed) Labs Reviewed  CBC - Abnormal; Notable for the following components:      Result Value   RBC 3.36 (*)    Hemoglobin 10.9 (*)    HCT 33.2 (*)    All other components within normal limits  PROTIME-INR - Abnormal; Notable for the following components:   Prothrombin Time 16.4 (*)    INR 1.3 (*)    All other components within normal limits  I-STAT CHEM 8, ED - Abnormal; Notable for the following components:   BUN 38 (*)    Creatinine, Ser 1.30 (*)    Glucose, Bld 134 (*)    Calcium, Ion 1.07 (*)    TCO2 21 (*)    Hemoglobin 10.2 (*)    HCT 30.0 (*)    All other components within normal limits  RESP PANEL BY RT-PCR (FLU A&B, COVID) ARPGX2  ETHANOL  COMPREHENSIVE METABOLIC PANEL  LACTIC ACID, PLASMA  SAMPLE TO BLOOD BANK    EKG None  Radiology CT HEAD WO CONTRAST  Result Date: 12/24/2021 CLINICAL DATA:  Head trauma, moderate-severe; Polytrauma, blunt EXAM: CT HEAD WITHOUT CONTRAST CT CERVICAL SPINE WITHOUT CONTRAST TECHNIQUE: Multidetector CT imaging of the head and cervical spine was performed following the standard protocol without intravenous contrast. Multiplanar CT image reconstructions of the cervical spine were also generated. RADIATION DOSE REDUCTION: This exam was performed according to the departmental dose-optimization program which includes automated exposure control, adjustment of the mA and/or kV according to patient size and/or use of iterative reconstruction technique. COMPARISON:  None Available. FINDINGS: CT HEAD FINDINGS Brain: No evidence of acute intracranial hemorrhage or extra-axial collection.The basal cisterns are patent. No concerning mass effect.The ventricles are normal in size.Scattered subcortical and periventricular white matter hypodensities,  nonspecific but likely sequela of chronic small vessel ischemic disease.Mild cerebral atrophy Vascular: Vascular calcifications. No hyperdense vessel. Skull: Negative for skull fracture. Sinuses/Orbits: No acute finding. Other: None. CT CERVICAL SPINE FINDINGS Alignment: No traumatic listhesis. Skull base and vertebrae: There is no acute cervical spine fracture. There is an anterior T2 compression fracture with approximately 10% height loss. Soft tissues and spinal canal: No prevertebral fluid or swelling. No visible canal hematoma. Disc levels: Minimal multilevel degenerative disc disease. Mild multilevel facet arthropathy. C1-C2 degenerative changes. Upper chest: Patchy ground-glass opacities in the lung apices. Other: There is a subcentimeter hypodense left thyroid nodule which requires no follow-up imaging. Patulous esophagus. IMPRESSION: No acute intracranial abnormality. Acute anterior compression fracture of T2 with approximately 10% height loss. No acute fracture in the cervical spine. Patchy ground-glass opacities in the lung apices, likely infectious/inflammatory. Electronically Signed   By: Caprice Renshaw M.D.   On: 12/24/2021 19:51   DG Pelvis Portable  Result Date: 12/24/2021 CLINICAL DATA:  Trauma.  Fall.  Pain. EXAM: PORTABLE PELVIS 1-2 VIEWS COMPARISON:  Abdomen 12/05/2020 FINDINGS: Degenerative changes in both hips, more severe on the right. Patient rotation limits evaluation but no evidence of acute fracture or dislocation in the pelvis or hips. SI joints and symphysis pubis are not displaced. Degenerative changes also demonstrated in the lower lumbar spine. Soft tissues are unremarkable. IMPRESSION: Degenerative changes in the hips, more severe on the right. No acute displaced fractures identified. Electronically Signed   By: Burman Nieves M.D.   On: 12/24/2021 19:34   DG Chest Port 1 View  Result Date: 12/24/2021 CLINICAL DATA:  Trauma.  Laceration to the right forehead. EXAM: PORTABLE  CHEST 1 VIEW COMPARISON:  11/30/2020 FINDINGS: Shallow inspiration. Heart size and pulmonary vascularity are normal. Lungs are clear. Calcified and tortuous aorta. No pleural effusions. No pneumothorax. Mediastinal contours appear intact. Degenerative changes in the spine and shoulders. IMPRESSION: No active disease. Electronically Signed   By: Burman Nieves M.D.   On: 12/24/2021 19:33    Procedures Procedures    Medications Ordered in ED Medications  Tdap (BOOSTRIX) injection 0.5 mL (0.5 mLs Intramuscular Given 12/24/21 1919)    ED Course/ Medical Decision Making/ A&P                           Medical Decision Making Amount and/or Complexity of Data Reviewed Labs: ordered. Radiology: ordered.  Risk Prescription drug management.   Patient came in as a level 1 trauma.  Has been hypotensive for EMS.  Blood pressure improved here.  Reportedly had large amount of blood on the scene.  Has laceration right forehead.  Patient  states she feels a little weak but otherwise good.  No neck pain.  No numbness or weakness.  Differential diagnosis includes hemorrhagic shock.  No other apparent injury but will get chest and pelvis x-rays to evaluate further trauma.  Also get head and cervical spine CTs.  Shows an anemia with a hemoglobin of 10.9.  Unsure baseline.  Creatinine also mildly elevated 1.3 also unsure of baseline.  Anemia could be no particular with the blood loss.  CT head and cervical spine showed T2 compression fracture.  Will be admitted by trauma with neurosurgery consult.  CRITICAL CARE Performed by: Benjiman Core Total critical care time: 30 minutes Critical care time was exclusive of separately billable procedures and treating other patients. Critical care was necessary to treat or prevent imminent or life-threatening deterioration. Critical care was time spent personally by me on the following activities: development of treatment plan with patient and/or surrogate as well as  nursing, discussions with consultants, evaluation of patient's response to treatment, examination of patient, obtaining history from patient or surrogate, ordering and performing treatments and interventions, ordering and review of laboratory studies, ordering and review of radiographic studies, pulse oximetry and re-evaluation of patient's condition.          Final Clinical Impression(s) / ED Diagnoses Final diagnoses:  Fall, initial encounter  Facial laceration, initial encounter  Compression fracture of T2 vertebra, initial encounter [facility])    Rx / DC Orders ED Discharge Orders     None         Benjiman Core, MD 12/24/21 2010

## 2021-12-24 NOTE — Progress Notes (Signed)
Orthopedic Tech Progress Note Patient Details:  Susan Pitts Sep 24, 1923 [id number] Level 2 Trauma  Patient ID: Susan Pitts, female   DOB: 09/08/1923, 88 y.o.   MRN: [id number]  Smitty Pluck 12/24/2021, 7:10 PM

## 2021-12-24 NOTE — H&P (Signed)
Susan Pitts is an 88 y.o. female.   Chief Complaint: fall HPI: 88yo F lives alone and fell after losing her balance. No LOC. Had bleeding from a forehead laceration. She was brought in as a level 2. On arrival, SBP was in the 70s so she was upgraded to a level 1. GCS 15. BP WNL after some fluids.  History reviewed. No pertinent past medical history.  Past Surgical History:  Procedure Laterality Date   REPLACEMENT TOTAL KNEE      No family history on file. Social History:  has no history on file for tobacco use, alcohol use, and drug use.  Allergies:  Allergies  Allergen Reactions   Penicillins     (Not in a hospital admission)   No results found for this or any previous visit (from the past 48 hour(s)). No results found.  Review of Systems  Constitutional:  Negative for activity change.  HENT:         See HPI  Eyes: Negative.   Respiratory: Negative.    Cardiovascular: Negative.   Gastrointestinal: Negative.   Endocrine: Negative.   Genitourinary: Negative.   Musculoskeletal:        Chronic back pain and knee pain  Allergic/Immunologic: Negative.   Neurological:  Negative for speech difficulty and weakness.  Hematological: Negative.   Psychiatric/Behavioral: Negative.      Blood pressure (!) 108/58, pulse 68, temperature (!) 96.8 F (36 C), temperature source Temporal, resp. rate 20, SpO2 100 %. Physical Exam Constitutional:      Appearance: She is not ill-appearing.  HENT:     Head:     Comments: Abrasion /lac R forehead 2cm, not bleeding    Nose: Nose normal.  Eyes:     Extraocular Movements: Extraocular movements intact.     Pupils: Pupils are equal, round, and reactive to light.  Neck:     Comments: collar Cardiovascular:     Rate and Rhythm: Normal rate and regular rhythm.  Pulmonary:     Effort: Pulmonary effort is normal.     Breath sounds: Normal breath sounds.  Abdominal:     General: Abdomen is flat. There is no distension.     Palpations:  Abdomen is soft.     Tenderness: There is no abdominal tenderness. There is no guarding or rebound.  Musculoskeletal:        General: No tenderness or deformity.  Skin:    General: Skin is warm and dry.     Capillary Refill: Capillary refill takes 2 to 3 seconds.  Neurological:     Mental Status: She is alert and oriented to person, place, and time.     Cranial Nerves: No cranial nerve deficit.     Comments: GCS 15  Psychiatric:        Mood and Affect: Mood normal.      Assessment/Plan 88yo F S/P GLF  Hypotension - resolved T2 FX - Dr. Jake Samples to consult. APP called at 78. Collar for now R forehead abrasion/laceration - closed with dermabond  Admit to Trauma, med surg, PT/OT Critical care  Susan Malady, MD 12/24/2021, 7:21 PM

## 2021-12-24 NOTE — Consult Note (Signed)
Reason for Consult:Fall with T2 compression fracture Referring Physician: Violeta Gelinas, MD   HPI: Susan Pitts is a 88 y.o. female who presented to the emergency department after a fall where she lost her balance and struck her head on the floor.  Per EMS, there was a significant amount of blood on the floor where she fell as well as a significant amount of blood coming from a laceration on her right forehead.  She was initially brought into the emergency department as a level 2 trauma, however, due to hypotension, she was upgraded to a level 1 trauma.  Her hypotension resolved with fluid resuscitation. She denies headaches, cervical, thoracic, and lumbar pain.  She further denies any difficulties with bowel or bladder function.  She denies paresthesia.  No reported use of anticoagulants.  She was placed in a hard cervical collar while in the emergency department.  CT imaging was obtained of the patient's head and cervical spine.  The patient's cervical spine CT was remarkable for an acute compression fracture at T2 and subsequently led to a neurosurgical consultation.  History reviewed. No pertinent past medical history.  Past Surgical History:  Procedure Laterality Date   REPLACEMENT TOTAL KNEE      No family history on file.  Social History:  has no history on file for tobacco use, alcohol use, and drug use.  Allergies:  Allergies  Allergen Reactions   Penicillins     Medications: I have reviewed the patient's current medications.  Results for orders placed or performed during the hospital encounter of 12/24/21 (from the past 48 hour(s))  Sample to Blood Bank     Status: None   Collection Time: 12/24/21  7:00 PM  Result Value Ref Range   Blood Bank Specimen SAMPLE AVAILABLE FOR TESTING    Sample Expiration      12/25/2021,2359 Performed at [facility], [address], [place], [place] [address]   CBC     Status: Abnormal   Collection Time: 12/24/21  7:15 PM  Result  Value Ref Range   WBC 9.5 4.0 - 10.5 K/uL   RBC 3.36 (L) 3.87 - 5.11 MIL/uL   Hemoglobin 10.9 (L) 12.0 - 15.0 g/dL   HCT 29.5 (L) 28.4 - 13.2 %   MCV 98.8 80.0 - 100.0 fL   MCH 32.4 26.0 - 34.0 pg   MCHC 32.8 30.0 - 36.0 g/dL   RDW 44.0 10.2 - 72.5 %   Platelets 169 150 - 400 K/uL   nRBC 0.0 0.0 - 0.2 %    Comment: Performed at [facility], [address], [place], [place] [address]  Protime-INR     Status: Abnormal   Collection Time: 12/24/21  7:15 PM  Result Value Ref Range   Prothrombin Time 16.4 (H) 11.4 - 15.2 seconds   INR 1.3 (H) 0.8 - 1.2    Comment: (NOTE) INR goal varies based on device and disease states. Performed at [facility], [address] [place], [place] [address]     CT HEAD WO CONTRAST  Result Date: 12/24/2021 CLINICAL DATA:  Head trauma, moderate-severe; Polytrauma, blunt EXAM: CT HEAD WITHOUT CONTRAST CT CERVICAL SPINE WITHOUT CONTRAST TECHNIQUE: Multidetector CT imaging of the head and cervical spine was performed following the standard protocol without intravenous contrast. Multiplanar CT image reconstructions of the cervical spine were also generated. RADIATION DOSE REDUCTION: This exam was performed according to the departmental dose-optimization program which includes automated exposure control, adjustment of  the mA and/or kV according to patient size and/or use of iterative reconstruction technique. COMPARISON:  None Available. FINDINGS: CT HEAD FINDINGS Brain: No evidence of acute intracranial hemorrhage or extra-axial collection.The basal cisterns are patent. No concerning mass effect.The ventricles are normal in size.Scattered subcortical and periventricular white matter hypodensities, nonspecific but likely sequela of chronic small vessel ischemic disease.Mild cerebral atrophy Vascular: Vascular calcifications. No hyperdense vessel. Skull: Negative for skull fracture. Sinuses/Orbits: No acute finding. Other: None. CT CERVICAL SPINE FINDINGS  Alignment: No traumatic listhesis. Skull base and vertebrae: There is no acute cervical spine fracture. There is an anterior T2 compression fracture with approximately 10% height loss. Soft tissues and spinal canal: No prevertebral fluid or swelling. No visible canal hematoma. Disc levels: Minimal multilevel degenerative disc disease. Mild multilevel facet arthropathy. C1-C2 degenerative changes. Upper chest: Patchy ground-glass opacities in the lung apices. Other: There is a subcentimeter hypodense left thyroid nodule which requires no follow-up imaging. Patulous esophagus. IMPRESSION: No acute intracranial abnormality. Acute anterior compression fracture of T2 with approximately 10% height loss. No acute fracture in the cervical spine. Patchy ground-glass opacities in the lung apices, likely infectious/inflammatory. Electronically Signed   By: Caprice Renshaw M.D.   On: 12/24/2021 19:51   DG Pelvis Portable  Result Date: 12/24/2021 CLINICAL DATA:  Trauma.  Fall.  Pain. EXAM: PORTABLE PELVIS 1-2 VIEWS COMPARISON:  Abdomen 12/05/2020 FINDINGS: Degenerative changes in both hips, more severe on the right. Patient rotation limits evaluation but no evidence of acute fracture or dislocation in the pelvis or hips. SI joints and symphysis pubis are not displaced. Degenerative changes also demonstrated in the lower lumbar spine. Soft tissues are unremarkable. IMPRESSION: Degenerative changes in the hips, more severe on the right. No acute displaced fractures identified. Electronically Signed   By: Burman Nieves M.D.   On: 12/24/2021 19:34   DG Chest Port 1 View  Result Date: 12/24/2021 CLINICAL DATA:  Trauma.  Laceration to the right forehead. EXAM: PORTABLE CHEST 1 VIEW COMPARISON:  11/30/2020 FINDINGS: Shallow inspiration. Heart size and pulmonary vascularity are normal. Lungs are clear. Calcified and tortuous aorta. No pleural effusions. No pneumothorax. Mediastinal contours appear intact. Degenerative changes in  the spine and shoulders. IMPRESSION: No active disease. Electronically Signed   By: Burman Nieves M.D.   On: 12/24/2021 19:33    ROS: Per HPI Blood pressure (!) 132/44, pulse (!) 53, temperature (!) 96.8 F (36 C), temperature source Temporal, resp. rate 17, SpO2 95 %. Physical Exam: Patient is awake, A/O X 4, conversant, and in good spirits. Eyes open spontaneously. They are in NAD and VSS. Doing well. Speech is fluent and appropriate. MAEW with good strength that is symmetric bilaterally.  BUE 5/5 throughout, BLE 5/5 throughout. Sensation to light touch is intact. PERLA, EOMI. CNs grossly intact.     Assessment/Plan: 88 year old female who is S/P fall. The patient's CT head and CT cervical spine were reviewed.  CT head without any acute intracranial abnormality.  The CT cervical spine revealed an acute anterior compression fracture of the T2 vertebra with approximately 10% height loss without any retropulsion into the spinal canal.  On examination the patient has full strength is full bilaterally.  She has no pathological reflexes.  Sensation is intact.  She has no complaints of cervical or thoracic pain.  No acute neurosurgical intervention is indicated.  She does not need a hard cervical collar or outpatient neurosurgery follow-up.  This will heal well on its own.  Neurosurgery will sign off.  Please call with any questions.     Council Mechanic, DNP, AGNP-C Neurosurgery Nurse Practitioner  [facility] [address], [place], [place] [address] P: [phone]    F: [phone]  12/25/2021 7:45 AM

## 2021-12-24 NOTE — TOC CAGE-AID Note (Signed)
Transition of Care [facility]) - CAGE-AID Screening   Patient Details  Name: Susan Pitts MRN: [id number] Date of Birth: [birth year]  [facility]) CM/SW Contact:    Leota Sauers, RN Phone Number: 12/24/2021, 11:30 PM   Clinical Narrative: Patient denies alcohol use, denies illicit drug use.   CAGE-AID Screening:    Have You Ever Felt You Ought to Cut Down on Your Drinking or Drug Use?: No Have People Annoyed You By Critizing Your Drinking Or Drug Use?: No Have You Felt Bad Or Guilty About Your Drinking Or Drug Use?: No Have You Ever Had a Drink or Used Drugs First Thing In The Morning to Steady Your Nerves or to Get Rid of a Hangover?: No CAGE-AID Score: 0  Substance Abuse Education Offered: No

## 2021-12-25 ENCOUNTER — Encounter (HOSPITAL_COMMUNITY): Payer: Self-pay | Admitting: General Surgery

## 2021-12-25 ENCOUNTER — Encounter (HOSPITAL_COMMUNITY): Payer: Medicare PPO

## 2021-12-25 DIAGNOSIS — D62 Acute posthemorrhagic anemia: Secondary | ICD-10-CM | POA: Diagnosis not present

## 2021-12-25 DIAGNOSIS — S22028A Other fracture of second thoracic vertebra, initial encounter for closed fracture: Secondary | ICD-10-CM | POA: Diagnosis not present

## 2021-12-25 DIAGNOSIS — S0181XA Laceration without foreign body of other part of head, initial encounter: Secondary | ICD-10-CM | POA: Diagnosis not present

## 2021-12-25 LAB — CBC
HCT: 26.8 % — ABNORMAL LOW (ref 36.0–46.0)
Hemoglobin: 8.9 g/dL — ABNORMAL LOW (ref 12.0–15.0)
MCH: 31.9 pg (ref 26.0–34.0)
MCHC: 33.2 g/dL (ref 30.0–36.0)
MCV: 96.1 fL (ref 80.0–100.0)
Platelets: 134 10*3/uL — ABNORMAL LOW (ref 150–400)
RBC: 2.79 MIL/uL — ABNORMAL LOW (ref 3.87–5.11)
RDW: 13.5 % (ref 11.5–15.5)
WBC: 6.7 10*3/uL (ref 4.0–10.5)
nRBC: 0 % (ref 0.0–0.2)

## 2021-12-25 LAB — BASIC METABOLIC PANEL
Anion gap: 10 (ref 5–15)
BUN: 37 mg/dL — ABNORMAL HIGH (ref 8–23)
CO2: 24 mmol/L (ref 22–32)
Calcium: 8.4 mg/dL — ABNORMAL LOW (ref 8.9–10.3)
Chloride: 106 mmol/L (ref 98–111)
Creatinine, Ser: 1.1 mg/dL — ABNORMAL HIGH (ref 0.44–1.00)
GFR, Estimated: 46 mL/min — ABNORMAL LOW (ref >60)
Glucose, Bld: 87 mg/dL (ref 70–99)
Potassium: 4 mmol/L (ref 3.5–5.1)
Sodium: 140 mmol/L (ref 135–145)

## 2021-12-25 MED ORDER — LIDOCAINE 5 % EX PTCH
1.0000 | MEDICATED_PATCH | CUTANEOUS | Status: DC
Start: 2021-12-25 — End: 2021-12-26
  Administered 2021-12-25: 1 via TRANSDERMAL
  Filled 2021-12-25: qty 1

## 2021-12-25 MED ORDER — LIDOCAINE 5 % EX PTCH: 1.0000 | MEDICATED_PATCH | CUTANEOUS | Status: DC

## 2021-12-25 MED ORDER — HYDROCODONE-ACETAMINOPHEN 5-325 MG PO TABS
1.0000 | ORAL_TABLET | Freq: Four times a day (QID) | ORAL | Status: DC | PRN
  Administered 2021-12-26: 1 via ORAL
  Filled 2021-12-25: qty 1

## 2021-12-25 NOTE — Care Management CC44 (Signed)
Condition Code 44 Documentation Completed  Patient Details  Name: Susan Pitts MRN: [id number] Date of Birth: 05-Jan-1924   Condition Code 44 given:  Yes Patient signature on Condition Code 44 notice:  Yes Documentation of 2 MD's agreement:  Yes Code 44 added to claim:  Yes    Glennon Mac, RN 12/25/2021, 4:13 PM

## 2021-12-25 NOTE — Evaluation (Signed)
Occupational Therapy Evaluation Patient Details Name: Susan Pitts MRN: [id number] DOB: 06/15/1923 Today's Date: 12/25/2021   History of Present Illness 88 y/o female presented to ED on 12/24/21 following fall at home. Sustained T2 fx. Awaiting NSGY consult. No pertinent PMH.   Clinical Impression   PTA, pt lives alone in level entry home. Pt typically Modified Independent with ADLs/mobility using RW with family assisting with IADLs as needed. Pt reports her last fall was one year prior. Pt presents now with minor deficits in strength, endurance and standing balance. Pt able to mobilize using RW at min guard and overall min guard for ADLs. Pt tangential at times during session, difficult to redirect. Anticipate pt to improve well enough to DC home with [facility] services. Will continue to follow acutely.      Recommendations for follow up therapy are one component of a multi-disciplinary discharge planning process, led by the attending physician.  Recommendations may be updated based on patient status, additional functional criteria and insurance authorization.   Follow Up Recommendations  Home health OT    Assistance Recommended at Discharge Intermittent Supervision/Assistance  Patient can return home with the following A little help with bathing/dressing/bathroom;Assistance with cooking/housework;Assist for transportation    Functional Status Assessment  Patient has had a recent decline in their functional status and demonstrates the ability to make significant improvements in function in a reasonable and predictable amount of time.  Equipment Recommendations  None recommended by OT    Recommendations for Other Services       Precautions / Restrictions Precautions Precautions: Fall;Cervical Precaution Booklet Issued: No Precaution Comments: cervical precautions reviewed for comfort Required Braces or Orthoses: Cervical Brace Cervical Brace: Hard collar;Other (comment) (C-spine not  cleared yet per order set) Restrictions Weight Bearing Restrictions: No      Mobility Bed Mobility Overal bed mobility: Needs Assistance Bed Mobility: Supine to Sit     Supine to sit: Min assist     General bed mobility comments: patient attempting to simulate how she gets in/out of bed with RW sitting at side of bed. Patient reporting different reasons why it was more difficult from her home due to hospital equipment, height of RW, and height of bed. MinA for trunk elevation .    Transfers Overall transfer level: Needs assistance Equipment used: Rolling walker (2 wheels) Transfers: Sit to/from Stand, Bed to chair/wheelchair/BSC Sit to Stand: Min guard     Step pivot transfers: Min guard     General transfer comment: patient perseverating on eating breakfast so mobility limited to step pivot transfer to recliner 4' away from her. Min guard for safety throughout      Balance Overall balance assessment: Needs assistance, History of Falls Sitting-balance support: No upper extremity supported, Feet supported Sitting balance-Leahy Scale: Fair     Standing balance support: Bilateral upper extremity supported, Reliant on assistive device for balance Standing balance-Leahy Scale: Poor Standing balance comment: reliant on RW for support                           ADL either performed or assessed with clinical judgement   ADL Overall ADL's : Needs assistance/impaired Eating/Feeding: Independent;Sitting   Grooming: Supervision/safety;Standing   Upper Body Bathing: Supervision/ safety;Sitting   Lower Body Bathing: Min guard;Sit to/from stand   Upper Body Dressing : Supervision/safety   Lower Body Dressing: Min guard;Sit to/from stand   Toilet Transfer: Min guard;Ambulation;Rolling walker (2 wheels)   Toileting- Clothing Manipulation  and Hygiene: Min guard;Sit to/from stand       Functional mobility during ADLs: Min guard;Rolling walker (2 wheels) General  ADL Comments: Pt moving fairly well without increased pain, no overt LOB. Particular about simulating home environment but able to manage daily tasks without much assist.     Vision Baseline Vision/History: 1 Wears glasses Ability to See in Adequate Light: 0 Adequate Patient Visual Report: No change from baseline Vision Assessment?: No apparent visual deficits     Perception     Praxis      Pertinent Vitals/Pain Pain Assessment Pain Assessment: Faces Faces Pain Scale: No hurt Pain Intervention(s): Monitored during session     Hand Dominance Right   Extremity/Trunk Assessment Upper Extremity Assessment Upper Extremity Assessment: Generalized weakness   Lower Extremity Assessment Lower Extremity Assessment: Defer to PT evaluation   Cervical / Trunk Assessment Cervical / Trunk Assessment: Kyphotic;Other exceptions Cervical / Trunk Exceptions: T2 compression fx   Communication Communication Communication: No difficulties   Cognition Arousal/Alertness: Awake/alert Behavior During Therapy: WFL for tasks assessed/performed Overall Cognitive Status: No family/caregiver present to determine baseline cognitive functioning                                 General Comments: seems WFL based on basic mobility tasks. Tangential during session, repeats self at times. able to recall home setup in detail, PLOF, etc     General Comments  bandage over forehead laceration    Exercises     Shoulder Instructions      Home Living Family/patient expects to be discharged to:: Private residence Living Arrangements: Alone Available Help at Discharge: Family;Available PRN/intermittently Type of Home: House Home Access: Stairs to enter Entrance Stairs-Number of Steps: 1 threshold   Home Layout: One level     Bathroom Shower/Tub: Producer, television/film/video: Handicapped height     Home Equipment: Agricultural consultant (2 wheels);Shower seat - built in;Grab bars -  tub/shower;Hand held shower head          Prior Functioning/Environment Prior Level of Function : Independent/Modified Independent;History of Falls (last six months)             Mobility Comments: uses RW for mobility. last fall was one year ago ADLs Comments: daughter does grocery shopping and has meals on wheels delivered. Able to complete ADLs in the  home        OT Problem List: Decreased strength;Decreased activity tolerance;Impaired balance (sitting and/or standing)      OT Treatment/Interventions: Self-care/ADL training;Therapeutic exercise;Energy conservation;DME and/or AE instruction;Therapeutic activities    OT Goals(Current goals can be found in the care plan section) Acute Rehab OT Goals Patient Stated Goal: have same [facility] therapists that she had previously OT Goal Formulation: With patient Time For Goal Achievement: 01/08/22 Potential to Achieve Goals: Good  OT Frequency: Min 2X/week    Co-evaluation              AM-PAC OT "6 Clicks" Daily Activity     Outcome Measure Help from another person eating meals?: None Help from another person taking care of personal grooming?: A Little Help from another person toileting, which includes using toliet, bedpan, or urinal?: A Little Help from another person bathing (including washing, rinsing, drying)?: A Little Help from another person to put on and taking off regular upper body clothing?: A Little Help from another person to put on and taking off regular lower body  clothing?: A Little 6 Click Score: 19   End of Session Equipment Utilized During Treatment: Rolling walker (2 wheels) Nurse Communication: Other (comment) (discussed with NT)  Activity Tolerance: Patient tolerated treatment well Patient left: in chair;with call bell/phone within reach;with chair alarm set  OT Visit Diagnosis: Other abnormalities of gait and mobility (R26.89)                Time: 6295-2841 OT Time Calculation (min): 25  min Charges:  OT General Charges $OT Visit: 1 Visit OT Evaluation $OT Eval Moderate Complexity: 1 Mod  Bradd Canary, OTR/L Acute Rehab Services Office: [phone]   Lorre Munroe 12/25/2021, 11:35 AM

## 2021-12-25 NOTE — Progress Notes (Signed)
[facility] Progress Note     Subjective: CC:  Alert this AM. Reports mild HA  as well as chronic lower extremity pain. Confirms that she lives at home alone in [place]. States she has 3 kids who live in the area. Reports she no longer drives, she stopped after a fall about one year ago. Her daughter gets her groceries and helps her with housekeeping. States she has a walker.   States she takes hydrocodone at home (5-325). Confirms she is not on blood thinners.  Objective: Vital signs in last 24 hours: Temp:  [96.8 F (36 C)-97.7 F (36.5 C)] 97.6 F (36.4 C) (07/28 0915) Pulse Rate:  [49-68] 60 (07/28 0915) Resp:  [12-20] 16 (07/28 0915) BP: (108-173)/(39-74) 144/70 (07/28 0915) SpO2:  [94 %-100 %] 96 % (07/28 0915) Last BM Date : 12/23/21  Intake/Output from previous day: No intake/output data recorded. Intake/Output this shift: No intake/output data recorded.  PE: Gen:  Alert, NAD, pleasant HENT: forehead abrasion that is hemostatic. Mild ecchymosis. Pupils equal round, EOMs in tact, c-collor removed by NS Card:  Regular rate and rhythm, pedal pulses 2+ BL Pulm:  Normal effort, clear to auscultation bilaterally Abd: Soft, non-tender, non-distended, bowel sounds present in all 4 quadrants, no HSM,  Skin: warm and dry, no rashes  Psych: A&Ox3   Lab Results:  Recent Labs    12/24/21 1915 12/25/21 0406  WBC 9.5 6.7  HGB 10.2*  10.9* 8.9*  HCT 30.0*  33.2* 26.8*  PLT 169 134*   BMET Recent Labs    12/24/21 1915 12/25/21 0406  NA 139  140 140  K 4.1  4.2 4.0  CL 108  105 106  CO2 23 24  GLUCOSE 139*  134* 87  BUN 41*  38* 37*  CREATININE 1.28*  1.30* 1.10*  CALCIUM 8.5* 8.4*   PT/INR Recent Labs    12/24/21 1915  LABPROT 16.4*  INR 1.3*   CMP     Component Value Date/Time   NA 140 12/25/2021 0406   K 4.0 12/25/2021 0406   CL 106 12/25/2021 0406   CO2 24 12/25/2021 0406   GLUCOSE 87 12/25/2021 0406   BUN 37 (H) 12/25/2021  0406   CREATININE 1.10 (H) 12/25/2021 0406   CALCIUM 8.4 (L) 12/25/2021 0406   PROT 5.3 (L) 12/24/2021 1915   ALBUMIN 2.7 (L) 12/24/2021 1915   AST 31 12/24/2021 1915   ALT 20 12/24/2021 1915   ALKPHOS 71 12/24/2021 1915   BILITOT 0.7 12/24/2021 1915   GFRNONAA 46 (L) 12/25/2021 0406   Lipase  No results found for: "LIPASE"  Studies/Results: CT HEAD WO CONTRAST  Result Date: 12/24/2021 CLINICAL DATA:  Head trauma, moderate-severe; Polytrauma, blunt EXAM: CT HEAD WITHOUT CONTRAST CT CERVICAL SPINE WITHOUT CONTRAST TECHNIQUE: Multidetector CT imaging of the head and cervical spine was performed following the standard protocol without intravenous contrast. Multiplanar CT image reconstructions of the cervical spine were also generated. RADIATION DOSE REDUCTION: This exam was performed according to the departmental dose-optimization program which includes automated exposure control, adjustment of the mA and/or kV according to patient size and/or use of iterative reconstruction technique. COMPARISON:  None Available. FINDINGS: CT HEAD FINDINGS Brain: No evidence of acute intracranial hemorrhage or extra-axial collection.The basal cisterns are patent. No concerning mass effect.The ventricles are normal in size.Scattered subcortical and periventricular white matter hypodensities, nonspecific but likely sequela of chronic small vessel ischemic disease.Mild cerebral atrophy Vascular: Vascular calcifications. No hyperdense vessel. Skull: Negative for skull  fracture. Sinuses/Orbits: No acute finding. Other: None. CT CERVICAL SPINE FINDINGS Alignment: No traumatic listhesis. Skull base and vertebrae: There is no acute cervical spine fracture. There is an anterior T2 compression fracture with approximately 10% height loss. Soft tissues and spinal canal: No prevertebral fluid or swelling. No visible canal hematoma. Disc levels: Minimal multilevel degenerative disc disease. Mild multilevel facet arthropathy. C1-C2  degenerative changes. Upper chest: Patchy ground-glass opacities in the lung apices. Other: There is a subcentimeter hypodense left thyroid nodule which requires no follow-up imaging. Patulous esophagus. IMPRESSION: No acute intracranial abnormality. Acute anterior compression fracture of T2 with approximately 10% height loss. No acute fracture in the cervical spine. Patchy ground-glass opacities in the lung apices, likely infectious/inflammatory. Electronically Signed   By: Caprice Renshaw M.D.   On: 12/24/2021 19:51   CT CERVICAL SPINE WO CONTRAST  Result Date: 12/24/2021 CLINICAL DATA:  Head trauma, moderate-severe; Polytrauma, blunt EXAM: CT HEAD WITHOUT CONTRAST CT CERVICAL SPINE WITHOUT CONTRAST TECHNIQUE: Multidetector CT imaging of the head and cervical spine was performed following the standard protocol without intravenous contrast. Multiplanar CT image reconstructions of the cervical spine were also generated. RADIATION DOSE REDUCTION: This exam was performed according to the departmental dose-optimization program which includes automated exposure control, adjustment of the mA and/or kV according to patient size and/or use of iterative reconstruction technique. COMPARISON:  None Available. FINDINGS: CT HEAD FINDINGS Brain: No evidence of acute intracranial hemorrhage or extra-axial collection.The basal cisterns are patent. No concerning mass effect.The ventricles are normal in size.Scattered subcortical and periventricular white matter hypodensities, nonspecific but likely sequela of chronic small vessel ischemic disease.Mild cerebral atrophy Vascular: Vascular calcifications. No hyperdense vessel. Skull: Negative for skull fracture. Sinuses/Orbits: No acute finding. Other: None. CT CERVICAL SPINE FINDINGS Alignment: No traumatic listhesis. Skull base and vertebrae: There is no acute cervical spine fracture. There is an anterior T2 compression fracture with approximately 10% height loss. Soft tissues and  spinal canal: No prevertebral fluid or swelling. No visible canal hematoma. Disc levels: Minimal multilevel degenerative disc disease. Mild multilevel facet arthropathy. C1-C2 degenerative changes. Upper chest: Patchy ground-glass opacities in the lung apices. Other: There is a subcentimeter hypodense left thyroid nodule which requires no follow-up imaging. Patulous esophagus. IMPRESSION: No acute intracranial abnormality. Acute anterior compression fracture of T2 with approximately 10% height loss. No acute fracture in the cervical spine. Patchy ground-glass opacities in the lung apices, likely infectious/inflammatory. Electronically Signed   By: Caprice Renshaw M.D.   On: 12/24/2021 19:51   DG Pelvis Portable  Result Date: 12/24/2021 CLINICAL DATA:  Trauma.  Fall.  Pain. EXAM: PORTABLE PELVIS 1-2 VIEWS COMPARISON:  Abdomen 12/05/2020 FINDINGS: Degenerative changes in both hips, more severe on the right. Patient rotation limits evaluation but no evidence of acute fracture or dislocation in the pelvis or hips. SI joints and symphysis pubis are not displaced. Degenerative changes also demonstrated in the lower lumbar spine. Soft tissues are unremarkable. IMPRESSION: Degenerative changes in the hips, more severe on the right. No acute displaced fractures identified. Electronically Signed   By: Burman Nieves M.D.   On: 12/24/2021 19:34   DG Chest Port 1 View  Result Date: 12/24/2021 CLINICAL DATA:  Trauma.  Laceration to the right forehead. EXAM: PORTABLE CHEST 1 VIEW COMPARISON:  11/30/2020 FINDINGS: Shallow inspiration. Heart size and pulmonary vascularity are normal. Lungs are clear. Calcified and tortuous aorta. No pleural effusions. No pneumothorax. Mediastinal contours appear intact. Degenerative changes in the spine and shoulders. IMPRESSION: No active disease. Electronically  Signed   By: Burman Nieves M.D.   On: 12/24/2021 19:33    Anti-infectives: Anti-infectives (From admission, onward)    None        Assessment/Plan  88yo F S/P GLF   Hypotension - resolved T2 FX - Dr. Jake Samples has seen the patient, note pending; non-op mgmt, no brace. R forehead abrasion/laceration - closed with dermabond ABL anemia - hgb 8.9 from 10.9 on admission, repeat in AM  FEN: Reg, saline lock IV ID: none VTE: SCD's, start lovenox this afternoon Foley: none Dispo: Med-surg, PT/OT, CBC in AM  EDD: 7/29     LOS: 1 day   I reviewed nursing notes, last 24 h vitals and pain scores, last 48 h intake and output, last 24 h labs and trends, and last 24 h imaging results.   Hosie Spangle, PA-C [facility] Please see Amion for pager number during day hours 7:00am-4:30pm

## 2021-12-25 NOTE — Care Management Obs Status (Signed)
MEDICARE OBSERVATION STATUS NOTIFICATION   Patient Details  Name: Susan Pitts MRN: [id number] Date of Birth: 1923/10/15   Medicare Observation Status Notification Given:  Yes    Glennon Mac, RN 12/25/2021, 4:13 PM

## 2021-12-25 NOTE — Evaluation (Signed)
Physical Therapy Evaluation Patient Details Name: Susan Pitts MRN: [id number] DOB: 10/15/1923 Today's Date: 12/25/2021  History of Present Illness  88 y/o female presented to ED on 12/24/21 following fall at home. Sustained T2 fx. Awaiting NSGY consult. No pertinent PMH.  Clinical Impression  Patient admitted with the above. PTA, patient lives alone and ambulating independently with RW in her home. Daughter does grocery shopping but does have meals on wheels delivered. Patient presents with weakness, impaired balance, and decreased activity tolerance. Patient tangential throughout session about home setup and technique of how she mobilizes in/out of bed and around house with RW. Patient required minA for bed mobility and min guard for sit to stand transfer. Able to take steps towards recliner with RW and min guard, but unable to progress mobility due to patient perseverating on wanting to finish breakfast even though agreeable to mobilizing to chair. Patient will benefit from skilled PT services during acute stay to address listed deficits. Recommend HHPT at discharge to maximize functional independence and safety in the home. Daughter can provide PRN assistance at home.        Recommendations for follow up therapy are one component of a multi-disciplinary discharge planning process, led by the attending physician.  Recommendations may be updated based on patient status, additional functional criteria and insurance authorization.  Follow Up Recommendations Home health PT      Assistance Recommended at Discharge Intermittent Supervision/Assistance  Patient can return home with the following  A little help with bathing/dressing/bathroom;Assistance with cooking/housework;Assist for transportation;Help with stairs or ramp for entrance    Equipment Recommendations None recommended by PT (patient states she owns necessary DME)  Recommendations for Other Services       Functional Status Assessment  Patient has had a recent decline in their functional status and demonstrates the ability to make significant improvements in function in a reasonable and predictable amount of time.     Precautions / Restrictions Precautions Precautions: Fall;Cervical Precaution Booklet Issued: No Precaution Comments: cervical precautions reviewed for comfort Required Braces or Orthoses: Cervical Brace Cervical Brace: Hard collar;Other (comment) (C-spine not cleared yet per order set)      Mobility  Bed Mobility Overal bed mobility: Needs Assistance Bed Mobility: Supine to Sit     Supine to sit: Min assist     General bed mobility comments: patient attempting to simulate how she gets in/out of bed with RW sitting at side of bed. Patient reporting different reasons why it was more difficult from her home due to hospital equipment, height of RW, and height of bed. MinA for trunk elevation .    Transfers Overall transfer level: Needs assistance Equipment used: Rolling Kimberle Stanfill (2 wheels) Transfers: Sit to/from Stand, Bed to chair/wheelchair/BSC Sit to Stand: Min guard   Step pivot transfers: Min guard       General transfer comment: patient perseverating on eating breakfast so mobility limited to step pivot transfer to recliner 4' away from her. Min guard for safety throughout    Ambulation/Gait               General Gait Details: patient perseverative on finishing breakfast but willing to get to chair. Unable to progress mobility  Information systems manager [place] (Stroke Patients Only)       Balance Overall balance assessment: Needs assistance, History of Falls Sitting-balance support: No upper extremity supported, Feet supported Sitting balance-Leahy Scale: Fair  Standing balance support: Bilateral upper extremity supported, Reliant on assistive device for balance Standing balance-Leahy Scale: Poor Standing balance comment: reliant on RW for  support                             Pertinent Vitals/Pain Pain Assessment Pain Assessment: Faces Faces Pain Scale: No hurt Pain Intervention(s): Monitored during session    Home Living Family/patient expects to be discharged to:: Private residence Living Arrangements: Alone Available Help at Discharge: Family;Available PRN/intermittently Type of Home: House Home Access: Stairs to enter   Entrance Stairs-Number of Steps: 1 threshold   Home Layout: One level Home Equipment: Agricultural consultant (2 wheels);Shower seat - built in;Grab bars - tub/shower      Prior Function Prior Level of Function : Independent/Modified Independent;History of Falls (last six months)             Mobility Comments: uses RW ADLs Comments: daughter does grocery shopping and has meals on wheels delivered     Hand Dominance        Extremity/Trunk Assessment   Upper Extremity Assessment Upper Extremity Assessment: Defer to OT evaluation    Lower Extremity Assessment Lower Extremity Assessment: Generalized weakness    Cervical / Trunk Assessment Cervical / Trunk Assessment: Kyphotic;Other exceptions Cervical / Trunk Exceptions: T2 compression fx  Communication   Communication: No difficulties  Cognition Arousal/Alertness: Awake/alert Behavior During Therapy: WFL for tasks assessed/performed Overall Cognitive Status: No family/caregiver present to determine baseline cognitive functioning                                 General Comments: seems WFL based on basic mobility tasks. Tangential during sessino        General Comments      Exercises     Assessment/Plan    PT Assessment Patient needs continued PT services  PT Problem List Decreased strength;Decreased activity tolerance;Decreased balance;Decreased mobility;Decreased safety awareness;Decreased knowledge of precautions       PT Treatment Interventions DME instruction;Gait training;Functional mobility  training;Therapeutic activities;Therapeutic exercise;Balance training;Patient/family education    PT Goals (Current goals can be found in the Care Plan section)  Acute Rehab PT Goals Patient Stated Goal: to go home PT Goal Formulation: With patient Time For Goal Achievement: 01/08/22 Potential to Achieve Goals: Good    Frequency Min 4X/week     Co-evaluation               AM-PAC PT "6 Clicks" Mobility  Outcome Measure Help needed turning from your back to your side while in a flat bed without using bedrails?: A Little Help needed moving from lying on your back to sitting on the side of a flat bed without using bedrails?: A Little Help needed moving to and from a bed to a chair (including a wheelchair)?: A Little Help needed standing up from a chair using your arms (e.g., wheelchair or bedside chair)?: A Little Help needed to walk in hospital room?: A Little Help needed climbing 3-5 steps with a railing? : A Lot 6 Click Score: 17    End of Session   Activity Tolerance: Patient tolerated treatment well Patient left: in chair;with call bell/phone within reach;with chair alarm set Nurse Communication: Mobility status PT Visit Diagnosis: Unsteadiness on feet (R26.81);Muscle weakness (generalized) (M62.81);History of falling (Z91.81)    Time: 4098-1191 PT Time Calculation (min) (ACUTE ONLY): 25 min  Charges:   PT Evaluation $PT Eval Moderate Complexity: 1 Mod          Khloei Spiker A. Dan Humphreys PT, DPT Acute Rehabilitation Services Office [phone]   Viviann Spare 12/25/2021, 10:31 AM

## 2021-12-25 NOTE — Progress Notes (Signed)
Mobility Specialist - Progress Note   12/25/21 1500  Mobility  Activity Ambulated with assistance in hallway  Level of Assistance Minimal assist, patient does 75% or more  Assistive Device Front wheel walker  Distance Ambulated (ft) 300 ft  Activity Response Tolerated well  $Mobility charge 1 Mobility   Pt received in bed and agreeable to mobility. Pt returned to bed with call bell and all needs met.   Sydney Joyce Mobility Specialist  

## 2021-12-25 NOTE — TOC Initial Note (Signed)
Transition of Care [facility]) - Initial/Assessment Note    Patient Details  Name: Susan Pitts MRN: [id number] Date of Birth: Jun 01, 1923  Transition of Care [facility]) CM/SW Contact:    Glennon Mac, RN Phone Number: 12/25/2021, 2:07 PM  Clinical Narrative:                 88 y/o female presented to ED on 12/24/21 following fall at home. Sustained T2 fx. prior to admission, patient independent with assistive devices; she uses a rolling walker to ambulate.  PT/OT recommending home health follow-up, and patient agreeable to follow-up services.  She has recently used [facility] for home health, and wishes to continue with this agency.  Referral to [facility] for continued home therapies.  No DME needs per patient/daughter.  Patient's daughter at bedside; states she can provide intermittent assistance at discharge.  Expected Discharge Plan: Home w Home Health Services Barriers to Discharge: Continued Medical Work up   Patient Goals and CMS Choice Patient states their goals for this hospitalization and ongoing recovery are:: to go home CMS Medicare.gov Compare Post Acute Care list provided to:: Patient Choice offered to / list presented to : Patient  Expected Discharge Plan and Services Expected Discharge Plan: Home w Home Health Services   Discharge Planning Services: CM Consult Post Acute Care Choice: Home Health Living arrangements for the past 2 months: Single Family Home                           HH Arranged: PT, OT HH Agency: [facility] Date [facility] Agency Contacted: 12/25/21 Time HH Agency Contacted: 1406 Representative spoke with at [facility] Agency: Clifton Custard Peele/ Sol Blazing  Prior Living Arrangements/Services Living arrangements for the past 2 months: Single Family Home Lives with:: Self Patient language and need for interpreter reviewed:: Yes Do you feel safe going back to the place where you live?: Yes      Need for Family Participation in Patient Care: Yes  (Comment) Care giver support system in place?: Yes (comment) Current home services: DME Criminal Activity/Legal Involvement Pertinent to Current Situation/Hospitalization: No - Comment as needed  Activities of Daily Living Home Assistive Devices/Equipment: Dan Humphreys (specify type) ADL Screening (condition at time of admission) Patient's cognitive ability adequate to safely complete daily activities?: Yes Is the patient deaf or have difficulty hearing?: No Does the patient have difficulty seeing, even when wearing glasses/contacts?: No Does the patient have difficulty concentrating, remembering, or making decisions?: Yes Patient able to express need for assistance with ADLs?: Yes Does the patient have difficulty dressing or bathing?: Yes Independently performs ADLs?: Yes (appropriate for developmental age) Does the patient have difficulty walking or climbing stairs?: Yes Weakness of Legs: Right Weakness of Arms/Hands: None                   Emotional Assessment Appearance:: Appears stated age Attitude/Demeanor/Rapport: Engaged Affect (typically observed): Accepting Orientation: : Oriented to Self, Oriented to Place, Oriented to  Time, Oriented to Situation      Admission diagnosis:  Closed T2 fracture (HCC) [S22.029A] Facial laceration, initial encounter [S01.81XA] Fall, initial encounter [W19.XXXA] Compression fracture of T2 vertebra, initial encounter [facility]) [S22.020A] Patient Active Problem List   Diagnosis Date Noted   Closed T2 fracture (HCC) 12/24/2021   PCP:  Garlan Fillers, MD Pharmacy:   [facility] [id number] - [place], [place] - [address] [facility] [address] [place]  [place] [address] Phone: [phone] Fax: [phone]     Social Determinants of Health (SDOH) Interventions    Readmission Risk Interventions     No data to display         Quintella Baton, RN, BSN  Trauma/Neuro ICU Case Manager [phone]

## 2021-12-26 DIAGNOSIS — S0181XA Laceration without foreign body of other part of head, initial encounter: Secondary | ICD-10-CM | POA: Diagnosis not present

## 2021-12-26 DIAGNOSIS — D62 Acute posthemorrhagic anemia: Secondary | ICD-10-CM | POA: Diagnosis not present

## 2021-12-26 DIAGNOSIS — S22028A Other fracture of second thoracic vertebra, initial encounter for closed fracture: Secondary | ICD-10-CM | POA: Diagnosis not present

## 2021-12-26 LAB — CBC
HCT: 25.8 % — ABNORMAL LOW (ref 36.0–46.0)
Hemoglobin: 8.5 g/dL — ABNORMAL LOW (ref 12.0–15.0)
MCH: 32.2 pg (ref 26.0–34.0)
MCHC: 32.9 g/dL (ref 30.0–36.0)
MCV: 97.7 fL (ref 80.0–100.0)
Platelets: 104 10*3/uL — ABNORMAL LOW (ref 150–400)
RBC: 2.64 MIL/uL — ABNORMAL LOW (ref 3.87–5.11)
RDW: 14 % (ref 11.5–15.5)
WBC: 5 10*3/uL (ref 4.0–10.5)
nRBC: 0 % (ref 0.0–0.2)

## 2021-12-26 LAB — BASIC METABOLIC PANEL
Anion gap: 3 — ABNORMAL LOW (ref 5–15)
BUN: 31 mg/dL — ABNORMAL HIGH (ref 8–23)
CO2: 24 mmol/L (ref 22–32)
Calcium: 8 mg/dL — ABNORMAL LOW (ref 8.9–10.3)
Chloride: 113 mmol/L — ABNORMAL HIGH (ref 98–111)
Creatinine, Ser: 1.06 mg/dL — ABNORMAL HIGH (ref 0.44–1.00)
GFR, Estimated: 48 mL/min — ABNORMAL LOW (ref >60)
Glucose, Bld: 104 mg/dL — ABNORMAL HIGH (ref 70–99)
Potassium: 4.1 mmol/L (ref 3.5–5.1)
Sodium: 140 mmol/L (ref 135–145)

## 2021-12-26 MED ORDER — HYDROCODONE-ACETAMINOPHEN 5-325 MG PO TABS: 1.0000 | ORAL_TABLET | Freq: Four times a day (QID) | ORAL | 0 refills | Status: DC | PRN

## 2021-12-26 NOTE — Progress Notes (Signed)
Physical Therapy Treatment Patient Details Name: Susan Pitts MRN: [id number] DOB: 1923-07-12 Today's Date: 12/26/2021   History of Present Illness 88 y/o female presented to ED on 12/24/21 following fall at home. Sustained T2 fx. Awaiting NSGY consult. No pertinent PMH.    PT Comments    Pt agreeable to treatment and motivated to perform exercise. Pt instructed in and performed gait training and therapeutic exercises. Pt progressing very well towards goals.   Recommendations for follow up therapy are one component of a multi-disciplinary discharge planning process, led by the attending physician.  Recommendations may be updated based on patient status, additional functional criteria and insurance authorization.  Follow Up Recommendations  Home health PT     Assistance Recommended at Discharge Intermittent Supervision/Assistance  Patient can return home with the following A little help with bathing/dressing/bathroom;Assistance with cooking/housework;Assist for transportation;Help with stairs or ramp for entrance   Equipment Recommendations  None recommended by PT (patient states she owns necessary DME)    Recommendations for Other Services       Precautions / Restrictions Precautions Precautions: Fall;Cervical Precaution Booklet Issued: No Precaution Comments: cervical precautions for comfort Cervical Brace: Other (comment) (no cervical brace per neurosurgery) Restrictions Weight Bearing Restrictions: No     Mobility  Bed Mobility Overal bed mobility: Needs Assistance Bed Mobility: Supine to Sit, Sit to Supine     Supine to sit: Modified independent (Device/Increase time), Independent, HOB elevated Sit to supine: Modified independent (Device/Increase time), Independent, HOB elevated   General bed mobility comments: Pt performed two supine <> sit transfers (once with bed rail and once without bed rail). Pt has adjustable bed at home but no bed rails.     Transfers Overall transfer level: Needs assistance Equipment used: Rolling walker (2 wheels) Transfers: Sit to/from Stand Sit to Stand: Min guard           General transfer comment: Min guard for safety.    Ambulation/Gait Ambulation/Gait assistance: Min guard, Supervision Gait Distance (Feet): 550 Feet Assistive device: Rolling walker (2 wheels) Gait Pattern/deviations: Step-through pattern, Trunk flexed Gait velocity: decreased Gait velocity interpretation: 1.31 - 2.62 ft/sec, indicative of limited community ambulator   General Gait Details: Pt required 3 very brief standing rest breaks. Pt required cues to maintain her COG forwards more inside RW. No LOB occurred.   Stairs             Wheelchair Mobility    Modified Rankin (Stroke Patients Only)       Balance Overall balance assessment: Needs assistance, History of Falls Sitting-balance support: No upper extremity supported, Feet supported Sitting balance-Leahy Scale: Fair     Standing balance support: Bilateral upper extremity supported, Reliant on assistive device for balance Standing balance-Leahy Scale: Poor Standing balance comment: reliant on RW for support                            Cognition Arousal/Alertness: Awake/alert Behavior During Therapy: WFL for tasks assessed/performed Overall Cognitive Status: No family/caregiver present to determine baseline cognitive functioning                                 General Comments: seems WFL based on tasks performed although minor short-term memory deficits present. Pt follows commands appropriately        Exercises General Exercises - Lower Extremity Hip ABduction/ADduction: Right, Left, Supine (12 reps) Straight Leg Raises:  Right, Left, Supine (12 reps) Toe Raises: Both, 10 reps, Standing (at counter with CGA) Heel Raises: Both, 10 reps, Standing (at counter with CGA) Other Exercises Other Exercises: mini bridges x 8  (limited knee flexion with therapist stabilizing pt's ankles)    General Comments General comments (skin integrity, edema, etc.): 95% SpO2 and 75 HR      Pertinent Vitals/Pain Pain Assessment Pain Assessment: 0-10 Pain Score: 0-No pain Pain Location: neck Pain Intervention(s): Monitored during session    Home Living                          Prior Function            PT Goals (current goals can now be found in the care plan section) Acute Rehab PT Goals Patient Stated Goal: to go home PT Goal Formulation: With patient Time For Goal Achievement: 01/08/22 Potential to Achieve Goals: Good Progress towards PT goals: Progressing toward goals    Frequency    Min 4X/week      PT Plan Current plan remains appropriate    Co-evaluation              AM-PAC PT "6 Clicks" Mobility   Outcome Measure  Help needed turning from your back to your side while in a flat bed without using bedrails?: A Little Help needed moving from lying on your back to sitting on the side of a flat bed without using bedrails?: A Little Help needed moving to and from a bed to a chair (including a wheelchair)?: A Little Help needed standing up from a chair using your arms (e.g., wheelchair or bedside chair)?: A Little Help needed to walk in hospital room?: A Little Help needed climbing 3-5 steps with a railing? : A Little 6 Click Score: 18    End of Session Equipment Utilized During Treatment: Gait belt Activity Tolerance: Patient tolerated treatment well Patient left: with call bell/phone within reach;in bed (teach-back method utilized successfully regarding how to contact nursing if pt needs to get OOB) Nurse Communication: Mobility status PT Visit Diagnosis: Unsteadiness on feet (R26.81);Muscle weakness (generalized) (M62.81);History of falling (Z91.81)     Time: 1025-1100 PT Time Calculation (min) (ACUTE ONLY): 35 min  Charges:  $Gait Training: 8-22 mins $Therapeutic  Exercise: 8-22 mins                     Tana Coast, PT    [employer] 12/26/2021, 11:43 AM

## 2021-12-26 NOTE — Progress Notes (Signed)
Subjective/Chief Complaint: Complains of back soreness.    Objective: Vital signs in last 24 hours: Temp:  [97.5 F (36.4 C)-98.5 F (36.9 C)] 98.5 F (36.9 C) (07/29 0729) Pulse Rate:  [67-70] 70 (07/29 0729) Resp:  [16-18] 16 (07/29 0729) BP: (136-161)/(48-64) 153/48 (07/29 0729) SpO2:  [97 %-98 %] 97 % (07/29 0729) Last BM Date : 12/23/21  Intake/Output from previous day: 07/28 0701 - 07/29 0700 In: -  Out: 650 [Urine:650] Intake/Output this shift: No intake/output data recorded.  General appearance: alert and cooperative Resp: clear to auscultation bilaterally Cardio: regular rate and rhythm GI: soft, nontender  Lab Results:  Recent Labs    12/25/21 0406 12/26/21 0053  WBC 6.7 5.0  HGB 8.9* 8.5*  HCT 26.8* 25.8*  PLT 134* 104*   BMET Recent Labs    12/25/21 0406 12/26/21 0053  NA 140 140  K 4.0 4.1  CL 106 113*  CO2 24 24  GLUCOSE 87 104*  BUN 37* 31*  CREATININE 1.10* 1.06*  CALCIUM 8.4* 8.0*   PT/INR Recent Labs    12/24/21 1915  LABPROT 16.4*  INR 1.3*   ABG No results for input(s): "PHART", "HCO3" in the last 72 hours.  Invalid input(s): "PCO2", "PO2"  Studies/Results: CT HEAD WO CONTRAST  Result Date: 12/24/2021 CLINICAL DATA:  Head trauma, moderate-severe; Polytrauma, blunt EXAM: CT HEAD WITHOUT CONTRAST CT CERVICAL SPINE WITHOUT CONTRAST TECHNIQUE: Multidetector CT imaging of the head and cervical spine was performed following the standard protocol without intravenous contrast. Multiplanar CT image reconstructions of the cervical spine were also generated. RADIATION DOSE REDUCTION: This exam was performed according to the departmental dose-optimization program which includes automated exposure control, adjustment of the mA and/or kV according to patient size and/or use of iterative reconstruction technique. COMPARISON:  None Available. FINDINGS: CT HEAD FINDINGS Brain: No evidence of acute intracranial hemorrhage or extra-axial  collection.The basal cisterns are patent. No concerning mass effect.The ventricles are normal in size.Scattered subcortical and periventricular white matter hypodensities, nonspecific but likely sequela of chronic small vessel ischemic disease.Mild cerebral atrophy Vascular: Vascular calcifications. No hyperdense vessel. Skull: Negative for skull fracture. Sinuses/Orbits: No acute finding. Other: None. CT CERVICAL SPINE FINDINGS Alignment: No traumatic listhesis. Skull base and vertebrae: There is no acute cervical spine fracture. There is an anterior T2 compression fracture with approximately 10% height loss. Soft tissues and spinal canal: No prevertebral fluid or swelling. No visible canal hematoma. Disc levels: Minimal multilevel degenerative disc disease. Mild multilevel facet arthropathy. C1-C2 degenerative changes. Upper chest: Patchy ground-glass opacities in the lung apices. Other: There is a subcentimeter hypodense left thyroid nodule which requires no follow-up imaging. Patulous esophagus. IMPRESSION: No acute intracranial abnormality. Acute anterior compression fracture of T2 with approximately 10% height loss. No acute fracture in the cervical spine. Patchy ground-glass opacities in the lung apices, likely infectious/inflammatory. Electronically Signed   By: Caprice Renshaw M.D.   On: 12/24/2021 19:51   CT CERVICAL SPINE WO CONTRAST  Result Date: 12/24/2021 CLINICAL DATA:  Head trauma, moderate-severe; Polytrauma, blunt EXAM: CT HEAD WITHOUT CONTRAST CT CERVICAL SPINE WITHOUT CONTRAST TECHNIQUE: Multidetector CT imaging of the head and cervical spine was performed following the standard protocol without intravenous contrast. Multiplanar CT image reconstructions of the cervical spine were also generated. RADIATION DOSE REDUCTION: This exam was performed according to the departmental dose-optimization program which includes automated exposure control, adjustment of the mA and/or kV according to patient  size and/or use of iterative reconstruction technique. COMPARISON:  None  Available. FINDINGS: CT HEAD FINDINGS Brain: No evidence of acute intracranial hemorrhage or extra-axial collection.The basal cisterns are patent. No concerning mass effect.The ventricles are normal in size.Scattered subcortical and periventricular white matter hypodensities, nonspecific but likely sequela of chronic small vessel ischemic disease.Mild cerebral atrophy Vascular: Vascular calcifications. No hyperdense vessel. Skull: Negative for skull fracture. Sinuses/Orbits: No acute finding. Other: None. CT CERVICAL SPINE FINDINGS Alignment: No traumatic listhesis. Skull base and vertebrae: There is no acute cervical spine fracture. There is an anterior T2 compression fracture with approximately 10% height loss. Soft tissues and spinal canal: No prevertebral fluid or swelling. No visible canal hematoma. Disc levels: Minimal multilevel degenerative disc disease. Mild multilevel facet arthropathy. C1-C2 degenerative changes. Upper chest: Patchy ground-glass opacities in the lung apices. Other: There is a subcentimeter hypodense left thyroid nodule which requires no follow-up imaging. Patulous esophagus. IMPRESSION: No acute intracranial abnormality. Acute anterior compression fracture of T2 with approximately 10% height loss. No acute fracture in the cervical spine. Patchy ground-glass opacities in the lung apices, likely infectious/inflammatory. Electronically Signed   By: Caprice Renshaw M.D.   On: 12/24/2021 19:51   DG Pelvis Portable  Result Date: 12/24/2021 CLINICAL DATA:  Trauma.  Fall.  Pain. EXAM: PORTABLE PELVIS 1-2 VIEWS COMPARISON:  Abdomen 12/05/2020 FINDINGS: Degenerative changes in both hips, more severe on the right. Patient rotation limits evaluation but no evidence of acute fracture or dislocation in the pelvis or hips. SI joints and symphysis pubis are not displaced. Degenerative changes also demonstrated in the lower lumbar  spine. Soft tissues are unremarkable. IMPRESSION: Degenerative changes in the hips, more severe on the right. No acute displaced fractures identified. Electronically Signed   By: Burman Nieves M.D.   On: 12/24/2021 19:34   DG Chest Port 1 View  Result Date: 12/24/2021 CLINICAL DATA:  Trauma.  Laceration to the right forehead. EXAM: PORTABLE CHEST 1 VIEW COMPARISON:  11/30/2020 FINDINGS: Shallow inspiration. Heart size and pulmonary vascularity are normal. Lungs are clear. Calcified and tortuous aorta. No pleural effusions. No pneumothorax. Mediastinal contours appear intact. Degenerative changes in the spine and shoulders. IMPRESSION: No active disease. Electronically Signed   By: Burman Nieves M.D.   On: 12/24/2021 19:33    Anti-infectives: Anti-infectives (From admission, onward)    None       Assessment/Plan: s/p * No surgery found * Advance diet Plan for home with home health 88yo F S/P GLF   Hypotension - resolved T2 FX - Dr. Jake Samples has seen the patient, note pending; non-op mgmt, no brace. R forehead abrasion/laceration - closed with dermabond ABL anemia - hgb 8.9 from 10.9 on admission, repeat in AM   FEN: Reg, saline lock IV ID: none VTE: SCD's, start lovenox this afternoon Foley: none Dispo: Med-surg, PT/OT, CBC in AM  EDD: 7/29     LOS: 1 day    I reviewed nursing notes, last 24 h vitals and pain scores, last 48 h intake and output, last 24 h labs and trends, and last 24 h imaging results.  LOS: 1 day    Chevis Pretty III 12/26/2021

## 2021-12-26 NOTE — Plan of Care (Signed)

## 2021-12-28 ENCOUNTER — Encounter: Payer: Self-pay | Admitting: Adult Health

## 2021-12-29 NOTE — Discharge Summary (Signed)
Patient ID: Susan Pitts [id number] 14-Sep-1923 88 y.o.  Admit date: 12/24/2021 Discharge date: 12/29/2021  Admitting Diagnosis: 88yo F S/P GLF Hypotension T2 FX  R forehead abrasion/laceration   Discharge Diagnosis 88yo F S/P GLF Hypotension - resolved T2 FX R forehead abrasion/laceration ABL anemia   Consultants NSGY  Reason for Admission: 88yo F lives alone and fell after losing her balance. No LOC. Had bleeding from a forehead laceration. She was brought in as a level 2. On arrival, SBP was in the 70s so she was upgraded to a level 1. GCS 15. BP WNL after some fluids  Procedures None  Hospital Course:  Patient presented as above and was found to have a T2 fx, R forehead laceration that was repaired with dermabond and hypotension  Admitted to trauma. NSGY consulted and recommended no bracing or f/u. Serial hgbs monitored until stabilized. Worked with PT/OT who recommended HH. TOC arranged. Daughter to provide intermittent assistance at d/c. Patient felt stable for d/c and was d/c'd by my attending on 7/29 (please see progress note). I was not directly involved in this patient's care and did not see the patient during their hospital stay, therefore the information in this discharge summary was taken entirely from the chart.  Allergies as of 12/26/2021       Reactions   Other Anaphylaxis, Swelling   Lima beans   Alendronate Sodium Other (See Comments)   Cardura [doxazosin Mesylate] Other (See Comments)   UNK reaction   Cardura [doxazosin]    Other reaction(s): Unknown   Carvedilol Other (See Comments)   Codeine Other (See Comments)   UNK reaction   Fosamax [alendronate]    Other reaction(s): gastritis   Inderal [propranolol] Other (See Comments)   UNK reaction   Penicillin G Sodium Other (See Comments)   Shrimp (diagnostic) Nausea And Vomiting   Penicillins Rash   Penicillins Hives, Rash        Medication List     TAKE these medications    aspirin EC 81  MG tablet Take 81 mg by mouth daily.   benazepril 40 MG tablet Commonly known as: LOTENSIN Take 40 mg by mouth 2 (two) times daily.   CALTRATE 600+D PO Take 1 tablet by mouth daily.   diazepam 5 MG tablet Commonly known as: VALIUM Take 5 mg by mouth 2 (two) times daily as needed for anxiety.   furosemide 20 MG tablet Commonly known as: LASIX Take 20 mg by mouth daily.   HYDROcodone-acetaminophen 5-325 MG tablet Commonly known as: NORCO/VICODIN Take 1 tablet by mouth 4 (four) times daily as needed for pain. What changed: Another medication with the same name was added. Make sure you understand how and when to take each.   HYDROcodone-acetaminophen 5-325 MG tablet Commonly known as: NORCO/VICODIN Take 1 tablet by mouth every 6 (six) hours as needed for moderate pain or severe pain. What changed: You were already taking a medication with the same name, and this prescription was added. Make sure you understand how and when to take each.   metoprolol succinate 50 MG 24 hr tablet Commonly known as: TOPROL-XL Take 50 mg by mouth daily.   multivitamin tablet Take 1 tablet by mouth daily.   raloxifene 60 MG tablet Commonly known as: EVISTA Take 60 mg by mouth daily.   Vitamin B-12 2500 MCG Subl Place 2,500 mcg under the tongue daily.   Vitamin D3 50 MCG (2000 UT) capsule Take 2,000 Units by mouth daily.  Follow-up Information     [facility]. Call in 2 week(s).   Why: As needed Contact information: [address] [place] [address] [phone]        Garlan Fillers, MD. Schedule an appointment as soon as possible for a visit.   Specialty: Internal Medicine Why: For follow up Contact information: [address] [place] [address] [phone]                 Signed: Leary Roca, [facility] 12/29/2021, 11:30 AM Please see Amion for pager number during day hours  7:00am-4:30pm

## 2022-01-02 DIAGNOSIS — R32 Unspecified urinary incontinence: Secondary | ICD-10-CM | POA: Diagnosis not present

## 2022-01-02 DIAGNOSIS — S0181XD Laceration without foreign body of other part of head, subsequent encounter: Secondary | ICD-10-CM | POA: Diagnosis not present

## 2022-01-02 DIAGNOSIS — M8008XD Age-related osteoporosis with current pathological fracture, vertebra(e), subsequent encounter for fracture with routine healing: Secondary | ICD-10-CM | POA: Diagnosis not present

## 2022-01-02 DIAGNOSIS — G8929 Other chronic pain: Secondary | ICD-10-CM | POA: Diagnosis not present

## 2022-01-02 DIAGNOSIS — I10 Essential (primary) hypertension: Secondary | ICD-10-CM | POA: Diagnosis not present

## 2022-01-02 DIAGNOSIS — Z9181 History of falling: Secondary | ICD-10-CM | POA: Diagnosis not present

## 2022-01-02 DIAGNOSIS — Z7982 Long term (current) use of aspirin: Secondary | ICD-10-CM | POA: Diagnosis not present

## 2022-01-15 DIAGNOSIS — R32 Unspecified urinary incontinence: Secondary | ICD-10-CM | POA: Diagnosis not present

## 2022-01-15 DIAGNOSIS — F419 Anxiety disorder, unspecified: Secondary | ICD-10-CM | POA: Diagnosis not present

## 2022-01-15 DIAGNOSIS — F3341 Major depressive disorder, recurrent, in partial remission: Secondary | ICD-10-CM | POA: Diagnosis not present

## 2022-01-15 DIAGNOSIS — E669 Obesity, unspecified: Secondary | ICD-10-CM | POA: Diagnosis not present

## 2022-01-15 DIAGNOSIS — M1990 Unspecified osteoarthritis, unspecified site: Secondary | ICD-10-CM | POA: Diagnosis not present

## 2022-01-15 DIAGNOSIS — Z604 Social exclusion and rejection: Secondary | ICD-10-CM | POA: Diagnosis not present

## 2022-01-15 DIAGNOSIS — Z6829 Body mass index (BMI) 29.0-29.9, adult: Secondary | ICD-10-CM | POA: Diagnosis not present

## 2022-01-15 DIAGNOSIS — I110 Hypertensive heart disease with heart failure: Secondary | ICD-10-CM | POA: Diagnosis not present

## 2022-01-15 DIAGNOSIS — I509 Heart failure, unspecified: Secondary | ICD-10-CM | POA: Diagnosis not present

## 2022-01-21 ENCOUNTER — Other Ambulatory Visit (HOSPITAL_COMMUNITY): Payer: Self-pay

## 2022-01-22 ENCOUNTER — Ambulatory Visit (HOSPITAL_COMMUNITY)
Admission: RE | Admit: 2022-01-22 | Discharge: 2022-01-22 | Disposition: A | Discharge status: Home / Self Care | Payer: Medicare PPO | Source: Ambulatory Visit | Attending: Internal Medicine | Admitting: Internal Medicine

## 2022-01-22 DIAGNOSIS — M810 Age-related osteoporosis without current pathological fracture: Secondary | ICD-10-CM | POA: Diagnosis present

## 2022-01-22 MED ORDER — DENOSUMAB 60 MG/ML ~~LOC~~ SOSY
60.0000 mg | PREFILLED_SYRINGE | Freq: Once | SUBCUTANEOUS | Status: AC
  Administered 2022-01-22: 60 mg via SUBCUTANEOUS

## 2022-01-22 MED ORDER — DENOSUMAB 60 MG/ML ~~LOC~~ SOSY
PREFILLED_SYRINGE | SUBCUTANEOUS | Status: AC
  Filled 2022-01-22: qty 1

## 2022-03-06 DIAGNOSIS — Z23 Encounter for immunization: Secondary | ICD-10-CM | POA: Diagnosis not present

## 2022-03-19 DIAGNOSIS — R7989 Other specified abnormal findings of blood chemistry: Secondary | ICD-10-CM | POA: Diagnosis not present

## 2022-03-19 DIAGNOSIS — I10 Essential (primary) hypertension: Secondary | ICD-10-CM | POA: Diagnosis not present

## 2022-03-19 DIAGNOSIS — F419 Anxiety disorder, unspecified: Secondary | ICD-10-CM | POA: Diagnosis not present

## 2022-03-19 DIAGNOSIS — E785 Hyperlipidemia, unspecified: Secondary | ICD-10-CM | POA: Diagnosis not present

## 2022-03-19 DIAGNOSIS — M810 Age-related osteoporosis without current pathological fracture: Secondary | ICD-10-CM | POA: Diagnosis not present

## 2022-03-23 DIAGNOSIS — I5042 Chronic combined systolic (congestive) and diastolic (congestive) heart failure: Secondary | ICD-10-CM | POA: Diagnosis not present

## 2022-03-23 DIAGNOSIS — Z0000 Encounter for general adult medical examination without abnormal findings: Secondary | ICD-10-CM | POA: Diagnosis not present

## 2022-03-23 DIAGNOSIS — Z1331 Encounter for screening for depression: Secondary | ICD-10-CM | POA: Diagnosis not present

## 2022-03-23 DIAGNOSIS — I872 Venous insufficiency (chronic) (peripheral): Secondary | ICD-10-CM | POA: Diagnosis not present

## 2022-03-23 DIAGNOSIS — M810 Age-related osteoporosis without current pathological fracture: Secondary | ICD-10-CM | POA: Diagnosis not present

## 2022-03-23 DIAGNOSIS — M25562 Pain in left knee: Secondary | ICD-10-CM | POA: Diagnosis not present

## 2022-03-23 DIAGNOSIS — R82998 Other abnormal findings in urine: Secondary | ICD-10-CM | POA: Diagnosis not present

## 2022-03-23 DIAGNOSIS — I83012 Varicose veins of right lower extremity with ulcer of calf: Secondary | ICD-10-CM | POA: Diagnosis not present

## 2022-03-23 DIAGNOSIS — E785 Hyperlipidemia, unspecified: Secondary | ICD-10-CM | POA: Diagnosis not present

## 2022-03-23 DIAGNOSIS — M25561 Pain in right knee: Secondary | ICD-10-CM | POA: Diagnosis not present

## 2022-03-23 DIAGNOSIS — I10 Essential (primary) hypertension: Secondary | ICD-10-CM | POA: Diagnosis not present

## 2022-07-23 ENCOUNTER — Other Ambulatory Visit (HOSPITAL_COMMUNITY): Payer: Self-pay | Admitting: *Deleted

## 2022-07-27 ENCOUNTER — Encounter (HOSPITAL_COMMUNITY)
Admission: RE | Admit: 2022-07-27 | Discharge: 2022-07-27 | Disposition: A | Discharge status: Home / Self Care | Payer: Medicare PPO | Source: Ambulatory Visit | Attending: Internal Medicine | Admitting: Internal Medicine

## 2022-07-27 DIAGNOSIS — M810 Age-related osteoporosis without current pathological fracture: Secondary | ICD-10-CM | POA: Insufficient documentation

## 2022-07-27 MED ORDER — DENOSUMAB 60 MG/ML ~~LOC~~ SOSY
60.0000 mg | PREFILLED_SYRINGE | Freq: Once | SUBCUTANEOUS | Status: AC
  Administered 2022-07-27: 60 mg via SUBCUTANEOUS

## 2022-09-28 DIAGNOSIS — I110 Hypertensive heart disease with heart failure: Secondary | ICD-10-CM | POA: Diagnosis not present

## 2022-09-28 DIAGNOSIS — M25562 Pain in left knee: Secondary | ICD-10-CM | POA: Diagnosis not present

## 2022-09-28 DIAGNOSIS — K5909 Other constipation: Secondary | ICD-10-CM | POA: Diagnosis not present

## 2022-09-28 DIAGNOSIS — I739 Peripheral vascular disease, unspecified: Secondary | ICD-10-CM | POA: Diagnosis not present

## 2022-09-28 DIAGNOSIS — N3281 Overactive bladder: Secondary | ICD-10-CM | POA: Diagnosis not present

## 2022-09-28 DIAGNOSIS — I872 Venous insufficiency (chronic) (peripheral): Secondary | ICD-10-CM | POA: Diagnosis not present

## 2022-09-28 DIAGNOSIS — I5042 Chronic combined systolic (congestive) and diastolic (congestive) heart failure: Secondary | ICD-10-CM | POA: Diagnosis not present

## 2022-09-28 DIAGNOSIS — M25561 Pain in right knee: Secondary | ICD-10-CM | POA: Diagnosis not present

## 2023-01-19 DIAGNOSIS — I5042 Chronic combined systolic (congestive) and diastolic (congestive) heart failure: Secondary | ICD-10-CM | POA: Diagnosis not present

## 2023-01-19 DIAGNOSIS — I739 Peripheral vascular disease, unspecified: Secondary | ICD-10-CM | POA: Diagnosis not present

## 2023-01-19 DIAGNOSIS — I110 Hypertensive heart disease with heart failure: Secondary | ICD-10-CM | POA: Diagnosis not present

## 2023-01-19 DIAGNOSIS — I272 Pulmonary hypertension, unspecified: Secondary | ICD-10-CM | POA: Diagnosis not present

## 2023-01-19 DIAGNOSIS — M25561 Pain in right knee: Secondary | ICD-10-CM | POA: Diagnosis not present

## 2023-01-19 DIAGNOSIS — I872 Venous insufficiency (chronic) (peripheral): Secondary | ICD-10-CM | POA: Diagnosis not present

## 2023-01-19 DIAGNOSIS — H6123 Impacted cerumen, bilateral: Secondary | ICD-10-CM | POA: Diagnosis not present

## 2023-01-19 IMAGING — DX DG ABD PORTABLE 1V
1 series · 1 of 1 positions shown · non-contrast
Comparison: No priors.

CLINICAL DATA: [AGE] female with history of abdominal pain.

EXAM:
PORTABLE ABDOMEN - 1 VIEW

[abdomen]
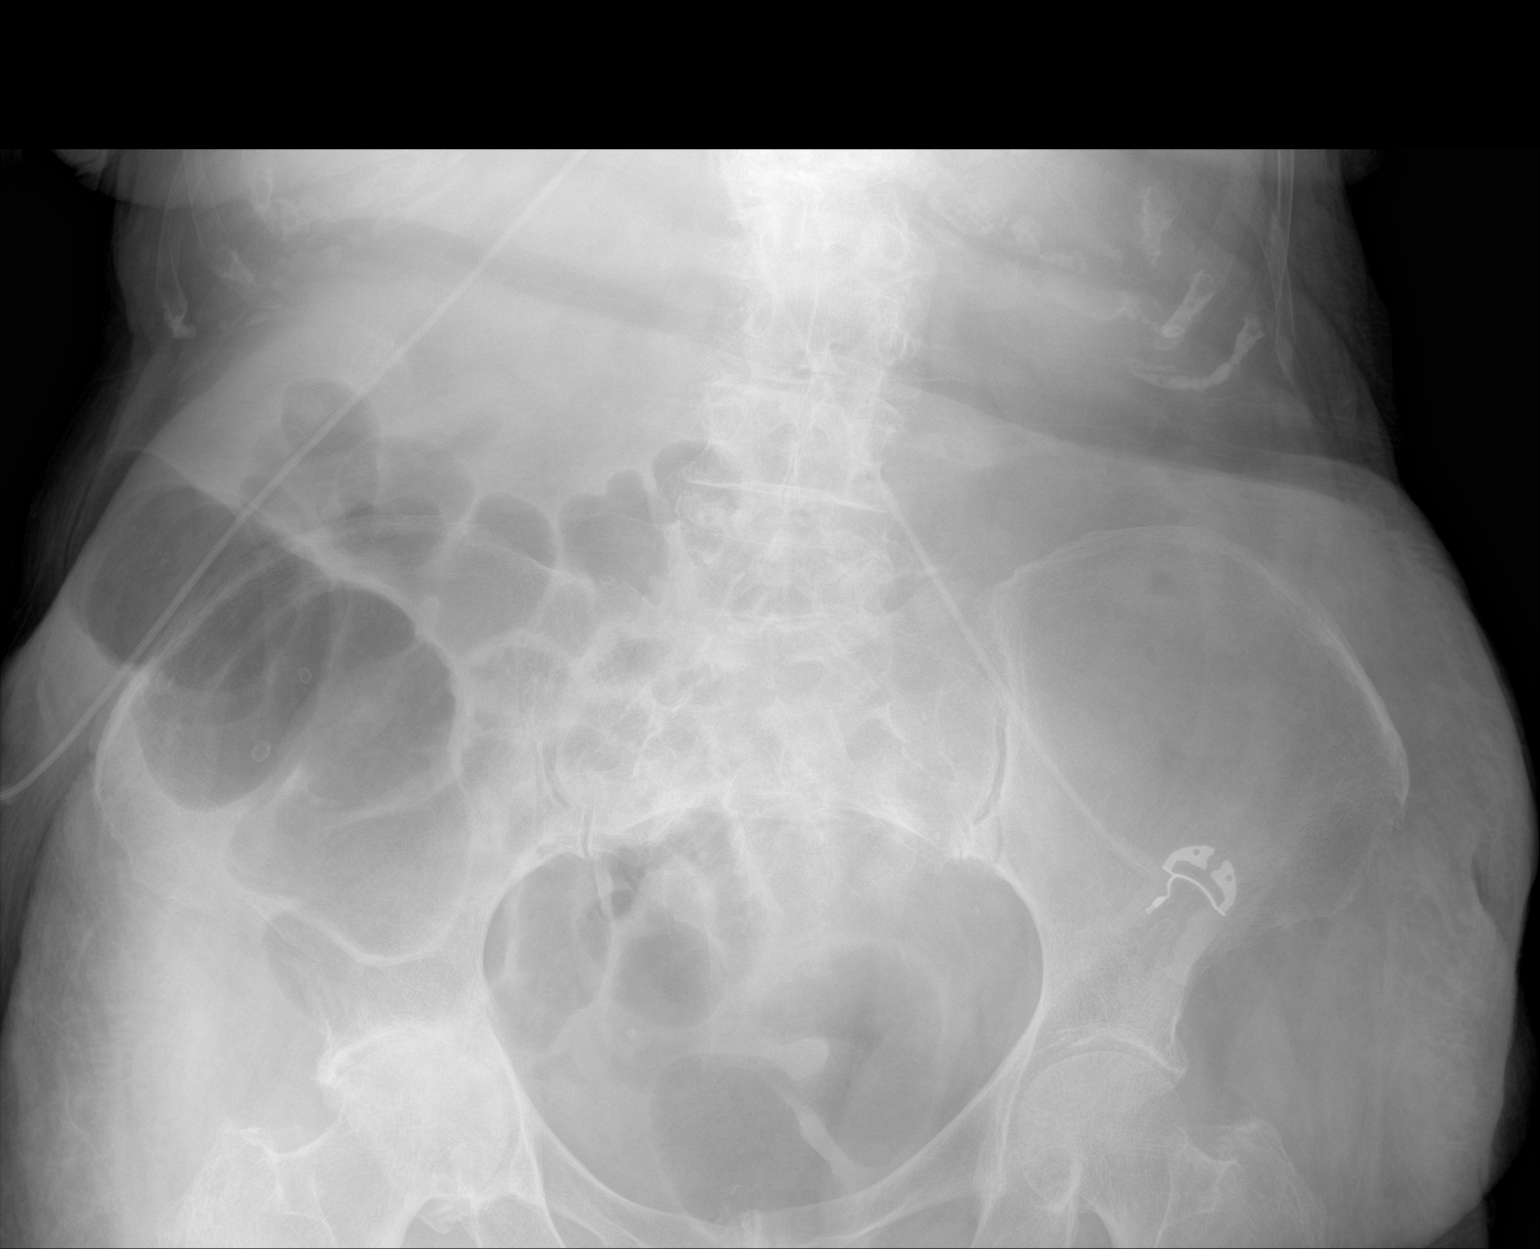

[1 of 1 positions shown; findings below may reference images not displayed]

FINDINGS: No pathologic dilatation of small bowel or colon. Several nondilated
loops of gas-filled small bowel are noted in the low abdomen and
central anatomic pelvis. Gas is noted in the cecum, transverse colon
and rectum. No radio-opaque calculi or other significant
radiographic abnormality are seen.
IMPRESSION: 1. Nonspecific, nonobstructive bowel gas pattern, as above.
2. No pneumoperitoneum.

## 2023-01-27 ENCOUNTER — Other Ambulatory Visit (HOSPITAL_COMMUNITY): Payer: Self-pay | Admitting: *Deleted

## 2023-01-28 ENCOUNTER — Encounter (HOSPITAL_COMMUNITY)
Admission: RE | Admit: 2023-01-28 | Discharge: 2023-01-28 | Disposition: A | Discharge status: Home / Self Care | Payer: Medicare PPO | Source: Ambulatory Visit | Attending: Internal Medicine | Admitting: Internal Medicine

## 2023-01-28 DIAGNOSIS — M810 Age-related osteoporosis without current pathological fracture: Secondary | ICD-10-CM | POA: Insufficient documentation

## 2023-01-28 MED ORDER — DENOSUMAB 60 MG/ML ~~LOC~~ SOSY
PREFILLED_SYRINGE | SUBCUTANEOUS | Status: AC
  Filled 2023-01-28: qty 1

## 2023-01-28 MED ORDER — DENOSUMAB 60 MG/ML ~~LOC~~ SOSY
60.0000 mg | PREFILLED_SYRINGE | Freq: Once | SUBCUTANEOUS | Status: AC
  Administered 2023-01-28: 60 mg via SUBCUTANEOUS

## 2023-03-05 DIAGNOSIS — Z23 Encounter for immunization: Secondary | ICD-10-CM | POA: Diagnosis not present

## 2023-03-10 DIAGNOSIS — R600 Localized edema: Secondary | ICD-10-CM | POA: Diagnosis not present

## 2023-03-10 DIAGNOSIS — Z96651 Presence of right artificial knee joint: Secondary | ICD-10-CM | POA: Diagnosis not present

## 2023-03-10 DIAGNOSIS — Z96652 Presence of left artificial knee joint: Secondary | ICD-10-CM | POA: Diagnosis not present

## 2023-04-19 DIAGNOSIS — I739 Peripheral vascular disease, unspecified: Secondary | ICD-10-CM | POA: Diagnosis not present

## 2023-04-19 DIAGNOSIS — M25561 Pain in right knee: Secondary | ICD-10-CM | POA: Diagnosis not present

## 2023-04-19 DIAGNOSIS — I872 Venous insufficiency (chronic) (peripheral): Secondary | ICD-10-CM | POA: Diagnosis not present

## 2023-04-19 DIAGNOSIS — I272 Pulmonary hypertension, unspecified: Secondary | ICD-10-CM | POA: Diagnosis not present

## 2023-04-19 DIAGNOSIS — I110 Hypertensive heart disease with heart failure: Secondary | ICD-10-CM | POA: Diagnosis not present

## 2023-04-19 DIAGNOSIS — I5042 Chronic combined systolic (congestive) and diastolic (congestive) heart failure: Secondary | ICD-10-CM | POA: Diagnosis not present

## 2023-06-06 DIAGNOSIS — I87332 Chronic venous hypertension (idiopathic) with ulcer and inflammation of left lower extremity: Secondary | ICD-10-CM | POA: Diagnosis not present

## 2023-06-06 DIAGNOSIS — I739 Peripheral vascular disease, unspecified: Secondary | ICD-10-CM | POA: Diagnosis not present

## 2023-06-06 DIAGNOSIS — L03116 Cellulitis of left lower limb: Secondary | ICD-10-CM | POA: Diagnosis not present

## 2023-06-17 DIAGNOSIS — I87332 Chronic venous hypertension (idiopathic) with ulcer and inflammation of left lower extremity: Secondary | ICD-10-CM | POA: Diagnosis not present

## 2023-06-17 DIAGNOSIS — I739 Peripheral vascular disease, unspecified: Secondary | ICD-10-CM | POA: Diagnosis not present

## 2023-06-17 DIAGNOSIS — B372 Candidiasis of skin and nail: Secondary | ICD-10-CM | POA: Diagnosis not present

## 2023-06-17 DIAGNOSIS — L03116 Cellulitis of left lower limb: Secondary | ICD-10-CM | POA: Diagnosis not present

## 2023-06-27 DIAGNOSIS — M810 Age-related osteoporosis without current pathological fracture: Secondary | ICD-10-CM | POA: Diagnosis not present

## 2023-06-27 DIAGNOSIS — I739 Peripheral vascular disease, unspecified: Secondary | ICD-10-CM | POA: Diagnosis not present

## 2023-06-27 DIAGNOSIS — I110 Hypertensive heart disease with heart failure: Secondary | ICD-10-CM | POA: Diagnosis not present

## 2023-06-27 DIAGNOSIS — I87331 Chronic venous hypertension (idiopathic) with ulcer and inflammation of right lower extremity: Secondary | ICD-10-CM | POA: Diagnosis not present

## 2023-06-27 DIAGNOSIS — E785 Hyperlipidemia, unspecified: Secondary | ICD-10-CM | POA: Diagnosis not present

## 2023-06-27 DIAGNOSIS — L03116 Cellulitis of left lower limb: Secondary | ICD-10-CM | POA: Diagnosis not present

## 2023-06-27 DIAGNOSIS — I87332 Chronic venous hypertension (idiopathic) with ulcer and inflammation of left lower extremity: Secondary | ICD-10-CM | POA: Diagnosis not present

## 2023-06-27 DIAGNOSIS — I5042 Chronic combined systolic (congestive) and diastolic (congestive) heart failure: Secondary | ICD-10-CM | POA: Diagnosis not present

## 2023-07-05 DIAGNOSIS — Z79891 Long term (current) use of opiate analgesic: Secondary | ICD-10-CM | POA: Diagnosis not present

## 2023-07-05 DIAGNOSIS — H6121 Impacted cerumen, right ear: Secondary | ICD-10-CM | POA: Diagnosis not present

## 2023-07-05 DIAGNOSIS — M25561 Pain in right knee: Secondary | ICD-10-CM | POA: Diagnosis not present

## 2023-07-05 DIAGNOSIS — I5042 Chronic combined systolic (congestive) and diastolic (congestive) heart failure: Secondary | ICD-10-CM | POA: Diagnosis not present

## 2023-07-05 DIAGNOSIS — F419 Anxiety disorder, unspecified: Secondary | ICD-10-CM | POA: Diagnosis not present

## 2023-07-05 DIAGNOSIS — I10 Essential (primary) hypertension: Secondary | ICD-10-CM | POA: Diagnosis not present

## 2023-07-05 DIAGNOSIS — R82998 Other abnormal findings in urine: Secondary | ICD-10-CM | POA: Diagnosis not present

## 2023-07-05 DIAGNOSIS — E785 Hyperlipidemia, unspecified: Secondary | ICD-10-CM | POA: Diagnosis not present

## 2023-07-05 DIAGNOSIS — I272 Pulmonary hypertension, unspecified: Secondary | ICD-10-CM | POA: Diagnosis not present

## 2023-07-05 DIAGNOSIS — Z0000 Encounter for general adult medical examination without abnormal findings: Secondary | ICD-10-CM | POA: Diagnosis not present

## 2023-07-05 DIAGNOSIS — I739 Peripheral vascular disease, unspecified: Secondary | ICD-10-CM | POA: Diagnosis not present

## 2023-07-27 ENCOUNTER — Inpatient Hospital Stay (HOSPITAL_COMMUNITY)
Admission: EM | Admit: 2023-07-27 | Discharge: 2023-08-01 | DRG: 291 | Disposition: A | Discharge status: Skilled Nursing Facility | Payer: Medicare PPO | Attending: Internal Medicine | Admitting: Internal Medicine

## 2023-07-27 ENCOUNTER — Emergency Department (HOSPITAL_COMMUNITY): Payer: Medicare PPO

## 2023-07-27 ENCOUNTER — Other Ambulatory Visit: Payer: Self-pay

## 2023-07-27 ENCOUNTER — Encounter (HOSPITAL_COMMUNITY): Payer: Self-pay

## 2023-07-27 DIAGNOSIS — Z96659 Presence of unspecified artificial knee joint: Secondary | ICD-10-CM | POA: Diagnosis present

## 2023-07-27 DIAGNOSIS — L89102 Pressure ulcer of unspecified part of back, stage 2: Secondary | ICD-10-CM | POA: Diagnosis not present

## 2023-07-27 DIAGNOSIS — Z634 Disappearance and death of family member: Secondary | ICD-10-CM

## 2023-07-27 DIAGNOSIS — Z91013 Allergy to seafood: Secondary | ICD-10-CM

## 2023-07-27 DIAGNOSIS — Z7401 Bed confinement status: Secondary | ICD-10-CM | POA: Diagnosis not present

## 2023-07-27 DIAGNOSIS — Z888 Allergy status to other drugs, medicaments and biological substances status: Secondary | ICD-10-CM | POA: Diagnosis not present

## 2023-07-27 DIAGNOSIS — I10 Essential (primary) hypertension: Secondary | ICD-10-CM | POA: Diagnosis not present

## 2023-07-27 DIAGNOSIS — Z91148 Patient's other noncompliance with medication regimen for other reason: Secondary | ICD-10-CM | POA: Diagnosis not present

## 2023-07-27 DIAGNOSIS — Z885 Allergy status to narcotic agent status: Secondary | ICD-10-CM

## 2023-07-27 DIAGNOSIS — Z91018 Allergy to other foods: Secondary | ICD-10-CM

## 2023-07-27 DIAGNOSIS — J90 Pleural effusion, not elsewhere classified: Secondary | ICD-10-CM | POA: Diagnosis not present

## 2023-07-27 DIAGNOSIS — I5043 Acute on chronic combined systolic (congestive) and diastolic (congestive) heart failure: Secondary | ICD-10-CM | POA: Diagnosis not present

## 2023-07-27 DIAGNOSIS — Z7982 Long term (current) use of aspirin: Secondary | ICD-10-CM | POA: Diagnosis not present

## 2023-07-27 DIAGNOSIS — I5033 Acute on chronic diastolic (congestive) heart failure: Secondary | ICD-10-CM | POA: Diagnosis not present

## 2023-07-27 DIAGNOSIS — Z79899 Other long term (current) drug therapy: Secondary | ICD-10-CM

## 2023-07-27 DIAGNOSIS — M79606 Pain in leg, unspecified: Secondary | ICD-10-CM | POA: Diagnosis not present

## 2023-07-27 DIAGNOSIS — R531 Weakness: Secondary | ICD-10-CM | POA: Diagnosis not present

## 2023-07-27 DIAGNOSIS — S22029D Unspecified fracture of second thoracic vertebra, subsequent encounter for fracture with routine healing: Secondary | ICD-10-CM | POA: Diagnosis not present

## 2023-07-27 DIAGNOSIS — I739 Peripheral vascular disease, unspecified: Secondary | ICD-10-CM | POA: Diagnosis not present

## 2023-07-27 DIAGNOSIS — I071 Rheumatic tricuspid insufficiency: Secondary | ICD-10-CM | POA: Diagnosis present

## 2023-07-27 DIAGNOSIS — I110 Hypertensive heart disease with heart failure: Principal | ICD-10-CM | POA: Diagnosis present

## 2023-07-27 DIAGNOSIS — I2721 Secondary pulmonary arterial hypertension: Secondary | ICD-10-CM | POA: Diagnosis present

## 2023-07-27 DIAGNOSIS — Z8249 Family history of ischemic heart disease and other diseases of the circulatory system: Secondary | ICD-10-CM

## 2023-07-27 DIAGNOSIS — M6259 Muscle wasting and atrophy, not elsewhere classified, multiple sites: Secondary | ICD-10-CM | POA: Diagnosis not present

## 2023-07-27 DIAGNOSIS — Z7409 Other reduced mobility: Secondary | ICD-10-CM | POA: Diagnosis present

## 2023-07-27 DIAGNOSIS — I890 Lymphedema, not elsewhere classified: Secondary | ICD-10-CM | POA: Diagnosis not present

## 2023-07-27 DIAGNOSIS — I509 Heart failure, unspecified: Principal | ICD-10-CM

## 2023-07-27 DIAGNOSIS — Z880 Allergy status to penicillin: Secondary | ICD-10-CM

## 2023-07-27 DIAGNOSIS — M6281 Muscle weakness (generalized): Secondary | ICD-10-CM | POA: Diagnosis not present

## 2023-07-27 DIAGNOSIS — F419 Anxiety disorder, unspecified: Secondary | ICD-10-CM | POA: Diagnosis present

## 2023-07-27 DIAGNOSIS — N183 Chronic kidney disease, stage 3 unspecified: Secondary | ICD-10-CM | POA: Diagnosis not present

## 2023-07-27 DIAGNOSIS — R5383 Other fatigue: Secondary | ICD-10-CM | POA: Diagnosis not present

## 2023-07-27 DIAGNOSIS — I700 Atherosclerosis of aorta: Secondary | ICD-10-CM | POA: Diagnosis not present

## 2023-07-27 DIAGNOSIS — J811 Chronic pulmonary edema: Secondary | ICD-10-CM | POA: Diagnosis not present

## 2023-07-27 LAB — CBC WITH DIFFERENTIAL/PLATELET
Abs Immature Granulocytes: 0.02 10*3/uL (ref 0.00–0.07)
Basophils Absolute: 0 10*3/uL (ref 0.0–0.1)
Basophils Relative: 0 %
Eosinophils Absolute: 0 10*3/uL (ref 0.0–0.5)
Eosinophils Relative: 0 %
HCT: 42.4 % (ref 36.0–46.0)
Hemoglobin: 14.4 g/dL (ref 12.0–15.0)
Immature Granulocytes: 0 %
Lymphocytes Relative: 14 %
Lymphs Abs: 1 10*3/uL (ref 0.7–4.0)
MCH: 34.1 pg — ABNORMAL HIGH (ref 26.0–34.0)
MCHC: 34 g/dL (ref 30.0–36.0)
MCV: 100.5 fL — ABNORMAL HIGH (ref 80.0–100.0)
Monocytes Absolute: 0.6 10*3/uL (ref 0.1–1.0)
Monocytes Relative: 8 %
Neutro Abs: 5.4 10*3/uL (ref 1.7–7.7)
Neutrophils Relative %: 78 %
Platelets: 173 10*3/uL (ref 150–400)
RBC: 4.22 MIL/uL (ref 3.87–5.11)
RDW: 16.2 % — ABNORMAL HIGH (ref 11.5–15.5)
WBC: 6.9 10*3/uL (ref 4.0–10.5)
nRBC: 0 % (ref 0.0–0.2)

## 2023-07-27 LAB — COMPREHENSIVE METABOLIC PANEL
ALT: 32 U/L (ref 0–44)
AST: 49 U/L — ABNORMAL HIGH (ref 15–41)
Albumin: 3.9 g/dL (ref 3.5–5.0)
Alkaline Phosphatase: 87 U/L (ref 38–126)
Anion gap: 14 (ref 5–15)
BUN: 41 mg/dL — ABNORMAL HIGH (ref 8–23)
CO2: 23 mmol/L (ref 22–32)
Calcium: 9.2 mg/dL (ref 8.9–10.3)
Chloride: 103 mmol/L (ref 98–111)
Creatinine, Ser: 1.06 mg/dL — ABNORMAL HIGH (ref 0.44–1.00)
GFR, Estimated: 48 mL/min — ABNORMAL LOW (ref >60)
Glucose, Bld: 110 mg/dL — ABNORMAL HIGH (ref 70–99)
Potassium: 3.8 mmol/L (ref 3.5–5.1)
Sodium: 140 mmol/L (ref 135–145)
Total Bilirubin: 1.6 mg/dL — ABNORMAL HIGH (ref 0.0–1.2)
Total Protein: 7 g/dL (ref 6.5–8.1)

## 2023-07-27 LAB — MAGNESIUM: Magnesium: 2 mg/dL (ref 1.7–2.4)

## 2023-07-27 LAB — BRAIN NATRIURETIC PEPTIDE: B Natriuretic Peptide: 1017.3 pg/mL — ABNORMAL HIGH (ref 0.0–100.0)

## 2023-07-27 MED ORDER — POLYETHYLENE GLYCOL 3350 17 G PO PACK: 17.0000 g | PACK | Freq: Every day | ORAL | Status: DC | PRN

## 2023-07-27 MED ORDER — FUROSEMIDE 10 MG/ML IJ SOLN
40.0000 mg | Freq: Two times a day (BID) | INTRAMUSCULAR | Status: DC
  Administered 2023-07-28 – 2023-07-29 (×3): 40 mg via INTRAVENOUS
  Filled 2023-07-27 (×3): qty 4

## 2023-07-27 MED ORDER — POTASSIUM CHLORIDE 20 MEQ PO PACK
40.0000 meq | PACK | Freq: Two times a day (BID) | ORAL | Status: DC
  Administered 2023-07-27 – 2023-07-31 (×10): 40 meq via ORAL
  Filled 2023-07-27 (×10): qty 2

## 2023-07-27 MED ORDER — ACETAMINOPHEN 650 MG RE SUPP: 650.0000 mg | Freq: Four times a day (QID) | RECTAL | Status: DC | PRN

## 2023-07-27 MED ORDER — BISACODYL 5 MG PO TBEC: 5.0000 mg | DELAYED_RELEASE_TABLET | Freq: Every day | ORAL | Status: DC | PRN

## 2023-07-27 MED ORDER — METOPROLOL SUCCINATE ER 50 MG PO TB24
50.0000 mg | ORAL_TABLET | Freq: Every day | ORAL | Status: DC
  Administered 2023-07-28 – 2023-08-01 (×5): 50 mg via ORAL
  Filled 2023-07-27 (×5): qty 1

## 2023-07-27 MED ORDER — ACETAMINOPHEN 325 MG PO TABS
650.0000 mg | ORAL_TABLET | Freq: Four times a day (QID) | ORAL | Status: DC | PRN
  Administered 2023-07-28: 650 mg via ORAL
  Filled 2023-07-27: qty 2

## 2023-07-27 MED ORDER — DIAZEPAM 5 MG PO TABS
5.0000 mg | ORAL_TABLET | Freq: Two times a day (BID) | ORAL | Status: DC | PRN
  Administered 2023-07-29: 5 mg via ORAL
  Filled 2023-07-27 (×2): qty 1

## 2023-07-27 MED ORDER — ENOXAPARIN SODIUM 40 MG/0.4ML IJ SOSY: 40.0000 mg | PREFILLED_SYRINGE | INTRAMUSCULAR | Status: DC

## 2023-07-27 MED ORDER — ALBUTEROL SULFATE (2.5 MG/3ML) 0.083% IN NEBU: 2.5000 mg | INHALATION_SOLUTION | Freq: Four times a day (QID) | RESPIRATORY_TRACT | Status: DC | PRN

## 2023-07-27 MED ORDER — HYDRALAZINE HCL 20 MG/ML IJ SOLN
10.0000 mg | Freq: Four times a day (QID) | INTRAMUSCULAR | Status: DC | PRN
  Administered 2023-07-28 (×2): 10 mg via INTRAVENOUS
  Filled 2023-07-27 (×2): qty 1

## 2023-07-27 MED ORDER — FUROSEMIDE 10 MG/ML IJ SOLN
40.0000 mg | Freq: Once | INTRAMUSCULAR | Status: AC
  Administered 2023-07-27: 40 mg via INTRAVENOUS
  Filled 2023-07-27: qty 4

## 2023-07-27 NOTE — H&P (Addendum)
 [facility] History and Physical  Susan Pitts ZOX:[id number] DOB: September 21, 1923 DOA: 07/27/2023 PCP: Garlan Fillers, MD  Presented from: Home Chief Complaint: Worsening pedal edema  History of Present Illness: Susan Pitts is a 89 y.o. female with PMH significant for HTN, mild systolic CHF, severe pulm artery hypertension, chronic b/l lower EXTR lymphedema. Patient was brought to the ED today by EMS from home for progressively worsening lower EXTR edema. Patient lives at home alone.  Able to perform her ADLs, with some help by her daughter who lives close by and visits few days a week.   Last known echo from 2022 with EF 50 to 55%, severely dilated LA, severely elevated pulm artery systolic pressure at 66 mmHg.   She has been on Lasix for a long time.  Because of the hassle of having to go to the bathroom frequently, patient has been lately using it as needed only.  She has noted progressively worsening bilateral lower extremity edema.   She has Unna boots but has difficulty putting it on and off.   She has had significant decreased mobility over the last few days.  She has also developed wound on left lower leg which started seeping.  This morning drainage was quite a bit and she decided to come to the hospital.  In the ED, afebrile, heart rate 75, blood pressure 163/68, breathing on room air at rest. On exam, she was noted to have significant bilateral lower EXTR edema up to her thigh with a small ulceration on the medial aspect of the left lower extremity with surrounding edema. Labs with WBC count 6.9, hemoglobin 14.4, platelet 173, BUN/creatinine 41/1.06, BNP 1017 Chest x-ray showed interval cardiomegaly and pulmonary edema with small bilateral pleural effusions. EKG with normal sinus rhythm at 76 bpm, no ST-T wave changes, QTc 409 ms.  Patient was given 1 dose of IV Lasix 40 mg. Hospitalist service consulted for inpatient management  At the time of my evaluation, patient was  lying down in bed. Not in distress. Daughter at bedside.  History reviewed and detailed as above.  Review of Systems:  All systems were reviewed and were negative unless otherwise mentioned in the HPI   Past medical history: Past Medical History:  Diagnosis Date   Arthritis    Constipation    Hypertension    Microscopic hematuria    Nocturia    Postmenopausal atrophic vaginitis     Past surgical history: Past Surgical History:  Procedure Laterality Date   ABDOMINAL HYSTERECTOMY     JOINT REPLACEMENT     knee replacement   REPLACEMENT TOTAL KNEE     TONSILLECTOMY      Social History:  reports that she has never smoked. She has never been exposed to tobacco smoke. She has never used smokeless tobacco. She reports that she does not drink alcohol and does not use drugs.  Allergies:  Allergies  Allergen Reactions   Other Anaphylaxis and Swelling    Lima beans   Alendronate Sodium Other (See Comments)   Cardura [Doxazosin Mesylate] Other (See Comments)    UNK reaction   Cardura [Doxazosin]     Other reaction(s): Unknown   Carvedilol Other (See Comments)   Codeine Other (See Comments)    UNK reaction   Fosamax [Alendronate]     Other reaction(s): gastritis   Inderal [Propranolol] Other (See Comments)    UNK reaction   Penicillin G Sodium Other (See Comments)   Shrimp (Diagnostic) Nausea And Vomiting  Penicillins Rash   Penicillins Hives and Rash   Other, Alendronate sodium, Cardura [doxazosin mesylate], Cardura [doxazosin], Carvedilol, Codeine, Fosamax [alendronate], Inderal [propranolol], Penicillin g sodium, Shrimp (diagnostic), Penicillins, and Penicillins   Family history:  Family History  Problem Relation Age of Onset   Heart attack Father      Physical Exam: Vitals:   07/27/23 1339 07/27/23 1721 07/27/23 1947  BP: (!) 163/68  (!) 163/78  Pulse: 75  69  Resp: 19  14  Temp:  98.2 F (36.8 C) 97.8 F (36.6 C)  TempSrc:  Oral Oral  SpO2: 95%  96%   Weight:   64.4 kg  Height:   5\' 3"  (1.6 m)   Wt Readings from Last 3 Encounters:  07/27/23 64.4 kg  12/19/20 71.4 kg  12/16/20 72.3 kg   Body mass index is 25.15 kg/m.  General exam: Pleasant, elderly Caucasian female.  Not in distress Skin: No rashes, lesions or ulcers. HEENT: Atraumatic, normocephalic, no obvious bleeding Lungs: Clear to auscultation bilaterally, no crackles at the time of my evaluation.  Not on supplemental oxygen CVS: S1, S2, no murmur,   GI/Abd: Soft, nontender, nondistended, bowel sound present,   CNS: Alert, awake, oriented x 3 Psychiatry: Mood appropriate,  Extremities: 2+ bilateral pedal edema, no calf tenderness.  Both lower extremities cool to touch.  ----------------------------------------------------------------------------------------------------------------------------------------- ----------------------------------------------------------------------------------------------------------------------------------------- -----------------------------------------------------------------------------------------------------------------------------------------  Assessment/Plan: Principal Problem:   Acute CHF (congestive heart failure) (HCC)  Acute exacerbation of right-sided CHF Presented with progressively worsening bilateral lower extremity edema. Most recent echo from 2022 with pulm artery systolic pressure 66 mmHg, EF slightly low at 50 to 55%. Patient had inconsistent compliance to Lasix because of difficulty with frequent urination. Clinically noted to have significant bilateral pedal edema BNP elevated. Chest x-ray with interstitial edema PTA meds-Toprol 50 mg daily, Lasix 20 mg daily, benazepril 40 mg twice daily Given 1 dose of Lasix 40 mg in the ED Start Lasix 40 mg IV twice daily from tomorrow. Resume metoprolol.  Keep benazepril on hold Monitor edema, blood pressure, renal function Update echocardiogram.  Anxiety Continue Valium as  needed  Impaired mobility PT eval ordered I discussed with patient and her daughter about her long-term disposition.  She wants to be back home.  Does not want to consider rehab or ALF yet.  She is expecting some home health care.  Goals of care:   Code Status: Full Code    DVT prophylaxis:  enoxaparin (LOVENOX) injection 40 mg Start: 07/28/23 0800   Antimicrobials: None Fluid: None Consultants: None Family Communication: Daughter at bedside  Dispo: The patient is from: Home              Anticipated d/c is to: Home in 2 to 3 days  Diet: Diet Order             Diet 2 gram sodium Room service appropriate? Yes; Fluid consistency: Thin  Diet effective now                    ------------------------------------------------------------------------------------- Severity of Illness: The appropriate patient status for this patient is INPATIENT. Inpatient status is judged to be reasonable and necessary in order to provide the required intensity of service to ensure the patient's safety. The patient's presenting symptoms, physical exam findings, and initial radiographic and laboratory data in the context of their chronic comorbidities is felt to place them at high risk for further clinical deterioration. Furthermore, it is not anticipated that the patient will be medically stable for  discharge from the hospital within 2 midnights of admission.   * I certify that at the point of admission it is my clinical judgment that the patient will require inpatient hospital care spanning beyond 2 midnights from the point of admission due to high intensity of service, high risk for further deterioration and high frequency of surveillance required.* -------------------------------------------------------------------------------------   Home Meds: Prior to Admission medications   Medication Sig Start Date End Date Taking? Authorizing Provider  acetaminophen (TYLENOL) 325 MG tablet Take 2 tablets  (650 mg total) by mouth every 6 (six) hours as needed for mild pain (or Fever >/= 101). 12/05/20   Drema Dallas, MD  aspirin 81 MG EC tablet Take 1 tablet (81 mg total) by mouth daily. Swallow whole. 12/20/20   Medina-Vargas, Monina C, NP  aspirin EC 81 MG tablet Take 81 mg by mouth daily.    [provider]  benazepril (LOTENSIN) 40 MG tablet Take 1 tablet (40 mg total) by mouth daily. 12/20/20   Medina-Vargas, Monina C, NP  benazepril (LOTENSIN) 40 MG tablet Take 40 mg by mouth 2 (two) times daily. 10/16/21   [provider]  Calcium Carbonate-Vitamin D (CALTRATE 600+D PO) Take 1 tablet by mouth daily.     [provider]  Calcium Carbonate-Vitamin D (CALTRATE 600+D PO) Take 1 tablet by mouth daily.    [provider]  Cholecalciferol (VITAMIN D-3 PO) Take 2,000 Units by mouth daily.     [provider]  Cholecalciferol (VITAMIN D3) 50 MCG (2000 UT) capsule Take 2,000 Units by mouth daily.    [provider]  Cyanocobalamin (VITAMIN B-12) 2500 MCG SUBL Place 2,500 mcg under the tongue daily.    [provider]  diazepam (VALIUM) 5 MG tablet Take 1 tablet (5 mg total) by mouth 2 (two) times daily as needed for anxiety, sedation or muscle spasms. 12/20/20   Medina-Vargas, Monina C, NP  diazepam (VALIUM) 5 MG tablet Take 5 mg by mouth 2 (two) times daily as needed for anxiety. 12/03/21   [provider]  Ensure (ENSURE) Take 237 mLs by mouth.    [provider]  furosemide (LASIX) 20 MG tablet Take 20 mg by mouth daily. 10/23/21   [provider]  furosemide (LASIX) 40 MG tablet Take 1 tablet (40 mg total) by mouth daily. 12/20/20   Medina-Vargas, Monina C, NP  HYDROcodone-acetaminophen (NORCO/VICODIN) 5-325 MG tablet Take 1 tablet by mouth every 6 (six) hours as needed for moderate pain. 12/20/20   Medina-Vargas, Monina C, NP  HYDROcodone-acetaminophen (NORCO/VICODIN) 5-325 MG tablet Take 1 tablet by mouth 4 (four)  times daily as needed for pain. 12/08/21   [provider]  HYDROcodone-acetaminophen (NORCO/VICODIN) 5-325 MG tablet Take 1 tablet by mouth every 6 (six) hours as needed for moderate pain or severe pain. 12/26/21   Chevis Pretty III, MD  Lidocaine HCl 4 % CREA apply to painful wrist and fingers daily as needed 12/20/20   Medina-Vargas, Monina C, NP  Liniments (BLUE-EMU SUPER STRENGTH) CREA Apply 1 application topically daily as needed (joint pain). 12/15/15   [provider]  loratadine (CLARITIN) 10 MG tablet Take 1 tablet (10 mg total) by mouth daily as needed for allergies. 12/20/20   Medina-Vargas, Monina C, NP  meclizine (ANTIVERT) 12.5 MG tablet Take 1 tablet (12.5 mg total) by mouth 2 (two) times daily as needed for dizziness. 12/20/20   Medina-Vargas, Monina C, NP  metoprolol succinate (TOPROL-XL) 50 MG 24 hr tablet Take  1 tablet (50 mg total) by mouth daily. 12/20/20   Medina-Vargas, Monina C, NP  metoprolol succinate (TOPROL-XL) 50 MG 24 hr tablet Take 50 mg by mouth daily. 11/22/21   [provider]  Multiple Vitamin (MULTIVITAMIN) capsule Take 1 capsule by mouth daily.    [provider]  Multiple Vitamin (MULTIVITAMIN) tablet Take 1 tablet by mouth daily.    [provider]  polyethylene glycol powder (GLYCOLAX/MIRALAX) 17 GM/SCOOP powder Take 17 g by mouth daily as needed for mild constipation. 12/05/20   Drema Dallas, MD  raloxifene (EVISTA) 60 MG tablet Take 1 tablet (60 mg total) by mouth daily. 12/20/20   Medina-Vargas, Monina C, NP  raloxifene (EVISTA) 60 MG tablet Take 60 mg by mouth daily. Patient not taking: Reported on 12/25/2021 10/27/21   [provider]  vitamin B-12 (CYANOCOBALAMIN) 500 MCG tablet Take 500 mcg by mouth daily.    [provider]    Labs on Admission:   CBC: Recent Labs  Lab 07/27/23 1431  WBC 6.9  NEUTROABS 5.4  HGB 14.4  HCT 42.4  MCV 100.5*  PLT 173    Basic Metabolic Panel: Recent Labs   Lab 07/27/23 1431  NA 140  K 3.8  CL 103  CO2 23  GLUCOSE 110*  BUN 41*  CREATININE 1.06*  CALCIUM 9.2  MG 2.0    Liver Function Tests: Recent Labs  Lab 07/27/23 1431  AST 49*  ALT 32  ALKPHOS 87  BILITOT 1.6*  PROT 7.0  ALBUMIN 3.9   No results for input(s): "LIPASE", "AMYLASE" in the last 168 hours. No results for input(s): "AMMONIA" in the last 168 hours.  Cardiac Enzymes: No results for input(s): "CKTOTAL", "CKMB", "CKMBINDEX", "TROPONINI" in the last 168 hours.  BNP (last 3 results) Recent Labs    07/27/23 1431  BNP 1,017.3*    ProBNP (last 3 results) No results for input(s): "PROBNP" in the last 8760 hours.  CBG: No results for input(s): "GLUCAP" in the last 168 hours.  Lipase  No results found for: "LIPASE"   Urinalysis    Component Value Date/Time   COLORURINE COLORLESS (A) 11/30/2020 1218   APPEARANCEUR CLEAR 11/30/2020 1218   LABSPEC 1.009 11/30/2020 1218   PHURINE 7.0 11/30/2020 1218   GLUCOSEU NEGATIVE 11/30/2020 1218   HGBUR NEGATIVE 11/30/2020 1218   BILIRUBINUR NEGATIVE 11/30/2020 1218   KETONESUR NEGATIVE 11/30/2020 1218   PROTEINUR NEGATIVE 11/30/2020 1218   NITRITE NEGATIVE 11/30/2020 1218   LEUKOCYTESUR NEGATIVE 11/30/2020 1218     Drugs of Abuse  No results found for: "LABOPIA", "COCAINSCRNUR", "LABBENZ", "AMPHETMU", "THCU", "LABBARB"    Radiological Exams on Admission: DG Chest Port 1 View Result Date: 07/27/2023 CLINICAL DATA:  Fatigue, weeping edema of the lower extremities. EXAM: PORTABLE CHEST 1 VIEW COMPARISON:  12/24/2021 FINDINGS: Interval enlarged cardiac silhouette. The aorta remains tortuous and partially calcified. Interval increased prominence of the pulmonary vasculature and interstitial markings, including Kerley lines. Interval small bilateral pleural effusions. Lower thoracic spine degenerative changes. Diffuse osteopenia. IMPRESSION: Interval cardiomegaly and pulmonary edema with small bilateral pleural  effusions. Electronically Signed   By: Beckie Salts M.D.   On: 07/27/2023 15:35     Signed, Lorin Glass, MD [facility] 07/27/2023

## 2023-07-27 NOTE — ED Provider Notes (Signed)
 [place] EMERGENCY DEPARTMENT AT [facility] Provider Note   CSN: [id number] Arrival date & time: 07/27/23  1251     History {Add pertinent medical, surgical, social history, OB history to HPI:1} Chief Complaint  Patient presents with   Weeping Edema    Susan Pitts is a 89 y.o. female.  89 yo F with a chief complaint of bilateral lower extremity edema.  Something this been going on for some time now.  She has been getting what sounds like Unna boots done at her PCPs office.  Patient last had this done at the beginning of February.  Since then her Lasix dose was have to due to increased frequency going to the bathroom and her leg swelling has gotten worse.  She has some sort of compression device at home that she uses off and on but needs help getting it put on.  She has had significant decreased mobility over the past few days.  Had developed a wound to the left lower leg.  Started having some watery drainage from the legs bilaterally this morning and decided to come to the hospital for evaluation.        Home Medications Prior to Admission medications   Medication Sig Start Date End Date Taking? Authorizing Provider  acetaminophen (TYLENOL) 325 MG tablet Take 2 tablets (650 mg total) by mouth every 6 (six) hours as needed for mild pain (or Fever >/= 101). 12/05/20   Drema Dallas, MD  aspirin 81 MG EC tablet Take 1 tablet (81 mg total) by mouth daily. Swallow whole. 12/20/20   Medina-Vargas, Monina C, NP  aspirin EC 81 MG tablet Take 81 mg by mouth daily.    [provider]  benazepril (LOTENSIN) 40 MG tablet Take 1 tablet (40 mg total) by mouth daily. 12/20/20   Medina-Vargas, Monina C, NP  benazepril (LOTENSIN) 40 MG tablet Take 40 mg by mouth 2 (two) times daily. 10/16/21   [provider]  Calcium Carbonate-Vitamin D (CALTRATE 600+D PO) Take 1 tablet by mouth daily.     [provider]  Calcium Carbonate-Vitamin D (CALTRATE 600+D PO) Take 1  tablet by mouth daily.    [provider]  Cholecalciferol (VITAMIN D-3 PO) Take 2,000 Units by mouth daily.     [provider]  Cholecalciferol (VITAMIN D3) 50 MCG (2000 UT) capsule Take 2,000 Units by mouth daily.    [provider]  Cyanocobalamin (VITAMIN B-12) 2500 MCG SUBL Place 2,500 mcg under the tongue daily.    [provider]  diazepam (VALIUM) 5 MG tablet Take 1 tablet (5 mg total) by mouth 2 (two) times daily as needed for anxiety, sedation or muscle spasms. 12/20/20   Medina-Vargas, Monina C, NP  diazepam (VALIUM) 5 MG tablet Take 5 mg by mouth 2 (two) times daily as needed for anxiety. 12/03/21   [provider]  Ensure (ENSURE) Take 237 mLs by mouth.    [provider]  furosemide (LASIX) 20 MG tablet Take 20 mg by mouth daily. 10/23/21   [provider]  furosemide (LASIX) 40 MG tablet Take 1 tablet (40 mg total) by mouth daily. 12/20/20   Medina-Vargas, Monina C, NP  HYDROcodone-acetaminophen (NORCO/VICODIN) 5-325 MG tablet Take 1 tablet by mouth every 6 (six) hours as needed for moderate pain. 12/20/20   Medina-Vargas, Monina C, NP  HYDROcodone-acetaminophen (NORCO/VICODIN) 5-325 MG tablet Take 1 tablet by mouth 4 (four) times daily as needed for pain. 12/08/21   [provider]  HYDROcodone-acetaminophen (NORCO/VICODIN) 5-325 MG tablet Take 1 tablet by mouth every 6 (six) hours as needed for moderate pain or severe pain. 12/26/21   Chevis Pretty III, MD  Lidocaine HCl 4 % CREA apply to painful wrist and fingers daily as needed 12/20/20   Medina-Vargas, Monina C, NP  Liniments (BLUE-EMU SUPER STRENGTH) CREA Apply 1 application topically daily as needed (joint pain). 12/15/15   [provider]  loratadine (CLARITIN) 10 MG tablet Take 1 tablet (10 mg total) by mouth daily as needed for allergies. 12/20/20   Medina-Vargas, Monina C, NP  meclizine (ANTIVERT) 12.5 MG tablet Take 1 tablet (12.5 mg total) by mouth 2 (two)  times daily as needed for dizziness. 12/20/20   Medina-Vargas, Monina C, NP  metoprolol succinate (TOPROL-XL) 50 MG 24 hr tablet Take 1 tablet (50 mg total) by mouth daily. 12/20/20   Medina-Vargas, Monina C, NP  metoprolol succinate (TOPROL-XL) 50 MG 24 hr tablet Take 50 mg by mouth daily. 11/22/21   [provider]  Multiple Vitamin (MULTIVITAMIN) capsule Take 1 capsule by mouth daily.    [provider]  Multiple Vitamin (MULTIVITAMIN) tablet Take 1 tablet by mouth daily.    [provider]  polyethylene glycol powder (GLYCOLAX/MIRALAX) 17 GM/SCOOP powder Take 17 g by mouth daily as needed for mild constipation. 12/05/20   Drema Dallas, MD  raloxifene (EVISTA) 60 MG tablet Take 1 tablet (60 mg total) by mouth daily. 12/20/20   Medina-Vargas, Monina C, NP  raloxifene (EVISTA) 60 MG tablet Take 60 mg by mouth daily. Patient not taking: Reported on 12/25/2021 10/27/21   [provider]  vitamin B-12 (CYANOCOBALAMIN) 500 MCG tablet Take 500 mcg by mouth daily.    [provider]      Allergies    Other, Alendronate sodium, Cardura [doxazosin mesylate], Cardura [doxazosin], Carvedilol, Codeine, Fosamax [alendronate], Inderal [propranolol], Penicillin g sodium, Shrimp (diagnostic), Penicillins, and Penicillins    Review of Systems   Review of Systems  Physical Exam Updated Vital Signs BP (!) 163/68 (BP Location: Right Arm)   Pulse 75   Resp 19   SpO2 95%  Physical Exam Vitals and nursing note reviewed.  Constitutional:      General: She is not in acute distress.    Appearance: She is well-developed. She is not diaphoretic.  HENT:     Head: Normocephalic and atraumatic.  Eyes:     Pupils: Pupils are equal, round, and reactive to light.  Cardiovascular:     Rate and Rhythm: Normal rate and regular rhythm.     Heart sounds: No murmur heard.    No friction rub. No gallop.  Pulmonary:     Effort: Pulmonary effort is normal.     Breath sounds: No  wheezing or rales.  Abdominal:     General: There is no distension.     Palpations: Abdomen is soft.     Tenderness: There is no abdominal tenderness.  Musculoskeletal:        General: No tenderness.     Cervical back: Normal range of motion and neck supple.  Skin:    General: Skin is warm and dry.     Comments: 4+ edema to the bilateral lower extremities up to the thighs.  She has a ulceration to the medial aspect of the left lower extremity.  There is some very mild surrounding erythema.  No drainage no fluctuance.  Neurological:     Mental Status: She is alert and  oriented to person, place, and time.  Psychiatric:        Behavior: Behavior normal.     ED Results / Procedures / Treatments   Labs (all labs ordered are listed, but only abnormal results are displayed) Labs Reviewed  CBC WITH DIFFERENTIAL/PLATELET  COMPREHENSIVE METABOLIC PANEL  MAGNESIUM  BRAIN NATRIURETIC PEPTIDE    EKG None  Radiology No results found.  Procedures Procedures  {Document cardiac monitor, telemetry assessment procedure when appropriate:1}  Medications Ordered in ED Medications - No data to display  ED Course/ Medical Decision Making/ A&P   {   Click here for ABCD2, HEART and other calculatorsREFRESH Note before signing :1}                              Medical Decision Making Amount and/or Complexity of Data Reviewed Labs: ordered. Radiology: ordered. ECG/medicine tests: ordered.  Risk Prescription drug management.   89 yo F with a chief complaint of lower extremity edema.  This has been going on for a while and she recently had her Lasix dose [place] because she was having trouble getting up and going to the bathroom.  I think due to this her leg swelling has gotten quite a bit worse.  She is having a lot of trouble now getting up and walking around and performing her ADLs.  She has a family member with her to is able to help out a few days a week but has been struggling to be able  to help out as much as she needs.  No difficulty breathing.  Patient has a wound to the left lower extremity.  Not obviously infected on exam.  No leukocytosis no anemia, no significant electrolyte abnormalities.  Renal functions at baseline.  Potassium is on the lower end of normal at 3.8.  Will give a bolus dose of Lasix here.  Chest x-ray independently interpreted by me without focal infiltrate or pneumothorax.  {Document critical care time when appropriate:1} {Document review of labs and clinical decision tools ie heart score, Chads2Vasc2 etc:1}  {Document your independent review of radiology images, and any outside records:1} {Document your discussion with family members, caretakers, and with consultants:1} {Document social determinants of health affecting pt's care:1} {Document your decision making why or why not admission, treatments were needed:1} Final Clinical Impression(s) / ED Diagnoses Final diagnoses:  None    Rx / DC Orders ED Discharge Orders     None

## 2023-07-27 NOTE — ED Triage Notes (Signed)
 Pt BIB EMS from Home due to weeping edema of the lower extremities. Pt reports weeping started today, pt noticed increase in lower edema over the past few weeks. Pt is on lasix at home. Weeping edema soaking through EMS gauze dressing. Pt reports a need for home assistance services and resources. Pt lives alone. Pt has a wound on the left lower leg; legs are cold to touch bilaterally.  In route BP 158/68 HR 86 RR 18 SpO2 96% RA

## 2023-07-28 ENCOUNTER — Inpatient Hospital Stay (HOSPITAL_COMMUNITY): Payer: Medicare PPO

## 2023-07-28 DIAGNOSIS — I5033 Acute on chronic diastolic (congestive) heart failure: Secondary | ICD-10-CM

## 2023-07-28 LAB — ECHOCARDIOGRAM COMPLETE
AR max vel: 1.09 cm2
AV Area VTI: 0.92 cm2
AV Area mean vel: 0.98 cm2
AV Mean grad: 9 mmHg
AV Peak grad: 14.5 mmHg
Ao pk vel: 1.91 m/s
Area-P 1/2: 4.83 cm2
Calc EF: 59.6 %
Height: 63 in
MV M vel: 5.04 m/s
MV Peak grad: 101.6 mmHg
MV VTI: 1.47 cm2
P 1/2 time: 556 ms
Radius: 0.5 cm
S' Lateral: 3.7 cm
Single Plane A2C EF: 54.8 %
Single Plane A4C EF: 65.9 %
Weight: 2271.62 [oz_av]

## 2023-07-28 LAB — CBC
HCT: 43.4 % (ref 36.0–46.0)
Hemoglobin: 13.9 g/dL (ref 12.0–15.0)
MCH: 32.6 pg (ref 26.0–34.0)
MCHC: 32 g/dL (ref 30.0–36.0)
MCV: 101.6 fL — ABNORMAL HIGH (ref 80.0–100.0)
Platelets: 136 10*3/uL — ABNORMAL LOW (ref 150–400)
RBC: 4.27 MIL/uL (ref 3.87–5.11)
RDW: 16.2 % — ABNORMAL HIGH (ref 11.5–15.5)
WBC: 5.4 10*3/uL (ref 4.0–10.5)
nRBC: 0 % (ref 0.0–0.2)

## 2023-07-28 LAB — BASIC METABOLIC PANEL WITH GFR
Anion gap: 10 (ref 5–15)
BUN: 41 mg/dL — ABNORMAL HIGH (ref 8–23)
CO2: 24 mmol/L (ref 22–32)
Calcium: 8.9 mg/dL (ref 8.9–10.3)
Chloride: 105 mmol/L (ref 98–111)
Creatinine, Ser: 1.13 mg/dL — ABNORMAL HIGH (ref 0.44–1.00)
GFR, Estimated: 44 mL/min — ABNORMAL LOW (ref >60)
Glucose, Bld: 83 mg/dL (ref 70–99)
Potassium: 4.6 mmol/L (ref 3.5–5.1)
Sodium: 139 mmol/L (ref 135–145)

## 2023-07-28 MED ORDER — ENOXAPARIN SODIUM 30 MG/0.3ML IJ SOSY
30.0000 mg | PREFILLED_SYRINGE | INTRAMUSCULAR | Status: DC
  Administered 2023-07-28 – 2023-07-30 (×3): 30 mg via SUBCUTANEOUS
  Filled 2023-07-28 (×3): qty 0.3

## 2023-07-28 NOTE — Progress Notes (Signed)
 Orthopedic Tech Progress Note Patient Details:  KATELINN JUSTICE 1924/04/12 [id number]  Patient ID: Brenton Grills Sensabaugh, female   DOB: February 22, 1924, 89 y.o.   MRN: [id number] Roland Rack boots unable to be applied due to Q day wound care orders, RN notified via secure chat. Darleen Crocker 07/28/2023, 3:34 PM

## 2023-07-28 NOTE — TOC Initial Note (Signed)
 Transition of Care [facility]) - Initial/Assessment Note   Patient Details  Name: Susan Pitts MRN: [id number] Date of Birth: 19-Jun-1923  Transition of [facility]) CM/SW Contact:    Ewing Schlein, LCSW Phone Number: 07/28/2023, 9:47 AM  Clinical Narrative: Patient is from home alone. TOC consulted for CHF screening, but patient has already been referred to the Heart Failure Navigation team so consult will be cleared at this time. PT/OT consulted. TOC awaiting recommendations.  Expected Discharge Plan: Home/Self Care Barriers to Discharge: Continued Medical Work up  Expected Discharge Plan and Services In-house Referral: Clinical Social Work Living arrangements for the past 2 months: Single Family Home  Prior Living Arrangements/Services Living arrangements for the past 2 months: Single Family Home Lives with:: Self Patient language and need for interpreter reviewed:: Yes Do you feel safe going back to the place where you live?: Yes      Need for Family Participation in Patient Care: No (Comment) Care giver support system in place?: Yes (comment) Criminal Activity/Legal Involvement Pertinent to Current Situation/Hospitalization: No - Comment as needed  Activities of Daily Living ADL Screening (condition at time of admission) Independently performs ADLs?: No Does the patient have a NEW difficulty with bathing/dressing/toileting/self-feeding that is expected to last >3 days?: No Does the patient have a NEW difficulty with getting in/out of bed, walking, or climbing stairs that is expected to last >3 days?: No Does the patient have a NEW difficulty with communication that is expected to last >3 days?: No Is the patient deaf or have difficulty hearing?: No Does the patient have difficulty seeing, even when wearing glasses/contacts?: No Does the patient have difficulty concentrating, remembering, or making decisions?: No  Emotional Assessment Orientation: : Oriented to Self, Oriented to  Place, Oriented to  Time, Oriented to Situation Alcohol / Substance Use: Not Applicable Psych Involvement: No (comment)  Admission diagnosis:  Acute CHF (congestive heart failure) (HCC) [I50.9] Patient Active Problem List   Diagnosis Date Noted   Closed T2 fracture (HCC) 12/24/2021   CKD (chronic kidney disease), stage III (HCC) 12/09/2020   Unspecified protein-calorie malnutrition (HCC) 12/09/2020   Acute on chronic diastolic CHF (congestive heart failure) (HCC) 12/04/2020   Pulmonary hypertension (HCC) 12/04/2020   PVD (peripheral vascular disease) (HCC) 12/04/2020   Dyspnea on exertion 12/04/2020   Anxiety 12/04/2020   DJD (degenerative joint disease) 12/04/2020   Acute CHF (congestive heart failure) (HCC) 11/30/2020   Chronic pain 09/03/2020   Low back pain 09/03/2020   Pain due to onychomycosis of toenails of both feet 11/21/2018   Clavi 11/21/2018   Peripheral vascular disease (HCC) 12/20/2017   Hand arthritis 01/13/2017   Abnormal gait 12/12/2013   Major depression, single episode 12/12/2013   Fall 10/24/2013   Gallstones 12/27/2011   Microscopic hematuria    Obesity 03/03/2011   Essential hypertension 10/28/2010   Long term (current) use of opiate analgesic 06/17/2009   Anxiety disorder 06/17/2009   Constipation 06/17/2009   Osteoarthritis 06/17/2009   PCP:  Garlan Fillers, MD Pharmacy:   [id number] [place], [place] - [address] [place] [address] Phone: [phone] Fax: [phone]  [facility] [address] [place] [address] Phone: [phone] Fax: [phone]  [facility] - [place], [place] - [phone] [address] [place] [address] Phone: [phone] Fax: [phone]  Social Drivers of Health (SDOH) Social History: SDOH Screenings  Food Insecurity: No Food Insecurity (07/27/2023)  Housing: Low  Risk  (07/27/2023)  Transportation Needs: Unmet Transportation Needs (07/27/2023)  Utilities: Not At Risk (07/27/2023)  Social Connections: Moderately Integrated (07/27/2023)  Tobacco Use: Low Risk  (07/27/2023)   SDOH Interventions: Transportation Interventions: Inpatient TOC, Other (Comment) (Transportation information added to AVS.)  Readmission Risk Interventions     No data to display

## 2023-07-28 NOTE — Evaluation (Signed)
 Physical Therapy Evaluation Patient Details Name: Susan Pitts MRN: [id number] DOB: 1924/02/02 Today's Date: 07/28/2023  History of Present Illness  Susan Pitts is a 89 y.o. female with PMH significant for HTN, mild systolic CHF, severe pulm artery hypertension, chronic b/l LE lymphedema.  Patient was brought to the ED today by EMS from home for progressively worsening LE edema. PMH significant for HTN, mild systolic CHF, severe pulm artery hypertension, chronic b/l lower EXTR lymphedema.  Clinical Impression  Pt admitted with above diagnosis. Pt mod I with amb prior to admission, lives alone. Pt amb 60' with min assist,2 very brief  rest breaks needed d/t DOE. LEs elevated on pillow and pad d/t weeping once pt returned to bed.may need post acute rehab if agreeable depending on progress and acute LOS  Pt currently with functional limitations due to the deficits listed below (see PT Problem List). Pt will benefit from acute skilled PT to increase their independence and safety with mobility to allow discharge.           If plan is discharge home, recommend the following: A little help with walking and/or transfers;A little help with bathing/dressing/bathroom;Help with stairs or ramp for entrance;Assist for transportation;Assistance with cooking/housework   Can travel by private vehicle   Yes    Equipment Recommendations None recommended by PT  Recommendations for Other Services       Functional Status Assessment Patient has had a recent decline in their functional status and demonstrates the ability to make significant improvements in function in a reasonable and predictable amount of time.     Precautions / Restrictions Precautions Precautions: Fall Restrictions Weight Bearing Restrictions Per Provider Order: No      Mobility  Bed Mobility Overal bed mobility: Needs Assistance Bed Mobility: Sit to Supine       Sit to supine: Min assist, Mod assist   General bed mobility  comments: assist to bring LEs on to bed, assist to scoot up in supine    Transfers Overall transfer level: Needs assistance Equipment used: Rolling walker (2 wheels) Transfers: Sit to/from Stand Sit to Stand: Min assist           General transfer comment: 2 attempts to come to stand. assist to rise and transition to RW, cues for wt shift and to flex knees as possible (knee flexion ltd bil at baseline)    Ambulation/Gait Ambulation/Gait assistance: Min assist, Contact guard assist Gait Distance (Feet): 60 Feet Assistive device: Rolling walker (2 wheels) Gait Pattern/deviations: Step-through pattern, Trunk flexed       General Gait Details: cues for trunk extension as able, proximity to RW and pace; cues to monitor fatigue and take standing rest as needed. 2 brief standing rests in 105' d/t DOE  Acupuncturist Bed    Modified Rankin (Stroke Patients Only)       Balance Overall balance assessment: Needs assistance Sitting-balance support: Feet supported, No upper extremity supported Sitting balance-Leahy Scale: Fair     Standing balance support: During functional activity, Reliant on assistive device for balance Standing balance-Leahy Scale: Poor                               Pertinent Vitals/Pain Pain Assessment Pain Assessment: No/denies pain    Home Living Family/patient expects to be discharged to:: Private residence Living Arrangements: Alone  Available Help at Discharge: Family;Available PRN/intermittently Type of Home: House Home Access: Stairs to enter   [employer] of Steps: 1-pt does not navigate this step without daughter present.   Home Layout: One level Home Equipment: Rolling Walker (2 wheels);Grab bars - tub/shower;Shower seat - built in;Shower seat;Hand held shower head;Toilet riser;Grab bars - toilet;Lift chair;Adaptive equipment Additional Comments: daughter works 12 hour days 3  days a week and lives in [place].    Prior Function Prior Level of Function : Independent/Modified Independent;Needs assist       Physical Assist : ADLs (physical)   ADLs (physical): IADLs Mobility Comments: Pt reports recent use of RW due to LE lymphedema. Daughter assists with one step to enter home.  Dtr comes every 2 weeks to assist with housekeeping, laundry, making bed. ADLs Comments: daughter does grocery shopping and has meals on wheels delivered. Able to complete ADLs in the home     Extremity/Trunk Assessment   Upper Extremity Assessment Upper Extremity Assessment: Defer to OT evaluation RUE Deficits / Details: OA to shoulder and hands. ROM WFL. MMT: shoulder ~3/5. elbow 4/5, grip 4/5 RUE Coordination: decreased fine motor (OA) LUE Deficits / Details: Shoulder limits to ~90 degrees. Elbow to hand ~4-/5. LUE Coordination: decreased fine motor (OA)    Lower Extremity Assessment Lower Extremity Assessment: Generalized weakness (bil LEs edematous, erythematous)       Communication   Communication Communication: No apparent difficulties    Cognition Arousal: Alert Behavior During Therapy: WFL for tasks assessed/performed, Anxious   PT - Cognitive impairments: No apparent impairments                       PT - Cognition Comments: talkative occasional redirection to task;   Following commands impaired: Only follows one step commands consistently     Cueing Cueing Techniques: Verbal cues, Gestural cues     General Comments      Exercises     Assessment/Plan    PT Assessment Patient needs continued PT services  PT Problem List Decreased strength;Decreased activity tolerance;Decreased balance;Decreased knowledge of use of DME;Decreased mobility;Cardiopulmonary status limiting activity       PT Treatment Interventions DME instruction;Gait training;Functional mobility training;Therapeutic activities;Patient/family education;Therapeutic exercise     PT Goals (Current goals can be found in the Care Plan section)  Acute Rehab PT Goals PT Goal Formulation: With patient Time For Goal Achievement: 08/11/23 Potential to Achieve Goals: Good    Frequency Min 1X/week     Co-evaluation               AM-PAC PT "6 Clicks" Mobility  Outcome Measure Help needed turning from your back to your side while in a flat bed without using bedrails?: A Little Help needed moving from lying on your back to sitting on the side of a flat bed without using bedrails?: A Little Help needed moving to and from a bed to a chair (including a wheelchair)?: A Little Help needed standing up from a chair using your arms (e.g., wheelchair or bedside chair)?: A Little Help needed to walk in hospital room?: A Little Help needed climbing 3-5 steps with a railing? : A Lot 6 Click Score: 17    End of Session Equipment Utilized During Treatment: Gait belt Activity Tolerance: Patient tolerated treatment well Patient left: with call bell/phone within reach;with bed alarm set;in bed   PT Visit Diagnosis: Other abnormalities of gait and mobility (R26.89)    Time: 1610-9604 PT Time Calculation (  min) (ACUTE ONLY): 27 min   Charges:   PT Evaluation $PT Eval Low Complexity: 1 Low PT Treatments $Gait Training: 8-22 mins PT General Charges $$ ACUTE PT VISIT: 1 Visit         Felix Pratt, PT  Acute Rehab Dept ([facility]) [phone]  07/28/2023   [facility] 07/28/2023, 3:12 PM

## 2023-07-28 NOTE — Evaluation (Signed)
 Occupational Therapy Evaluation Patient Details Name: Susan Pitts MRN: [id number] DOB: [birth year] Today's Date: 07/28/2023   History of Present Illness   Susan Pitts is a 89 y.o. female with PMH significant for HTN, mild systolic CHF, severe pulm artery hypertension, chronic b/l LE lymphedema.  Patient was brought to the ED today by EMS from home for progressively worsening LE edema. PMH significant for HTN, mild systolic CHF, severe pulm artery hypertension, chronic b/l lower EXTR lymphedema.     Clinical Impressions Patient is currently requiring as high as Total assistance with basic ADLs, as well as  Minimal assist with bed mobility and moderate assist with functional transfers to recliner with use of RW.   Current level of function is below patient's typical baseline where she is Modified independent with all BADLs and lives alone.    During this evaluation, patient was limited by anxiety and decreased acceptance of current deficits, generalized weakness, impaired activity tolerance, and BLE edema with weeping, all of which has the potential to impact patient's and/or caregivers' safety and independence during functional mobility, as well as performance for ADLs.    Patient lives alone with a daughter who  is unable to provide daily supervision and assistance due to working 12 hours a day for 3 days a week.  Patient demonstrates good rehab potential, and should benefit from continued skilled occupational therapy services while in acute care to maximize safety, independence and quality of life at home.  Continued occupational therapy services are recommended.  ?      If plan is discharge home, recommend the following:   A lot of help with bathing/dressing/bathroom;A lot of help with walking and/or transfers;Assistance with cooking/housework;Assist for transportation     Functional Status Assessment   Patient has had a recent decline in their functional status and demonstrates  the ability to make significant improvements in function in a reasonable and predictable amount of time.     Equipment Recommendations   None recommended by OT     Recommendations for Other Services   PT consult     Precautions/Restrictions   Precautions Precautions: Fall     Mobility Bed Mobility Overal bed mobility: Needs Assistance Bed Mobility: Supine to Sit     Supine to sit: Min assist, HOB elevated     General bed mobility comments: Pt asks for HHA for trunk to raise up. Able to advance own LEs, slowly and able to scoot with Min HHA.    Transfers                          Balance Overall balance assessment: Needs assistance Sitting-balance support: Bilateral upper extremity supported, Feet supported Sitting balance-Leahy Scale: Fair     Standing balance support: Bilateral upper extremity supported, During functional activity, Reliant on assistive device for balance Standing balance-Leahy Scale: Poor                             ADL either performed or assessed with clinical judgement   ADL Overall ADL's : Needs assistance/impaired Eating/Feeding: Set up;Supervision/ safety Eating/Feeding Details (indicate cue type and reason): "I choked!". Pt laying far down in bed with HOB too low for safe eating. Pt educated and helped to chair to complete meal .Pt ed to take meals in chair from now on. Grooming: Set up;Supervision/safety;Sitting Grooming Details (indicate cue type and reason): chair level. Upper Body Bathing: Supervision/ safety;Set  up;Sitting   Lower Body Bathing: Moderate assistance;Minimal assistance;Sitting/lateral leans   Upper Body Dressing : Set up;Sitting   Lower Body Dressing: Total assistance;Sitting/lateral leans;Bed level Lower Body Dressing Details (indicate cue type and reason): To don/doff socks. Pt uses her sock aid at home but LEs been too edematous for success. Toilet Transfer: Moderate assistance;Rolling  walker (2 wheels);Stand-pivot Statistician Details (indicate cue type and reason): Pt stood from elevated EOB (tried fvrom low and pt lost balance as unable to pull feet underneath her due to swelling) with Mod As to RW. Pt pivoted to Recliner with Mod As and RW. Mod As to descnd safely to recliner.   Toileting - Clothing Manipulation Details (indicate cue type and reason): pure wick     Functional mobility during ADLs: Minimal assistance;Moderate assistance;Rolling walker (2 wheels);Cueing for sequencing;Cueing for safety       Vision Ability to See in Adequate Light: 0 Adequate (had eye surgery. "They're getting a little  dimmer")       Perception Perception: Within Functional Limits       Praxis Praxis: WFL       Pertinent Vitals/Pain Pain Assessment Pain Assessment: Faces Faces Pain Scale: Hurts a little bit Pain Location: LLE Pain Intervention(s): Limited activity within patient's tolerance, Monitored during session (Removed sock once in chair and pt reported pain resolved.)     Extremity/Trunk Assessment Upper Extremity Assessment Upper Extremity Assessment: LUE deficits/detail;Right hand dominant;RUE deficits/detail RUE Deficits / Details: OA to shoulder and hands. ROM WFL. MMT: shoulder ~3/5. elbow 4/5, grip 4/5 RUE Coordination: decreased fine motor (OA) LUE Deficits / Details: Shoulder limits to ~90 degrees. Elbow to hand ~4-/5. LUE Coordination: decreased fine motor (OA)           Communication Communication Communication: No apparent difficulties   Cognition Arousal: Alert Behavior During Therapy: WFL for tasks assessed/performed, Anxious Cognition: Cognition impaired     Awareness: Online awareness intact, Intellectual awareness intact Memory impairment (select all impairments): Short-term memory (repeats questions) Attention impairment (select first level of impairment): Selective attention Executive functioning impairment (select all impairments):  Reasoning OT - Cognition Comments: Ox4                 Following commands: Impaired Following commands impaired: Only follows one step commands consistently (but requires repitition)     Cueing  General Comments          Exercises     Shoulder Instructions      Home Living Family/patient expects to be discharged to:: Private residence Living Arrangements: Alone   Type of Home: House Home Access: Stairs to enter [employer] of Steps: 1-pt does not navigate this step without daughter present.   Home Layout: One level     Bathroom Shower/Tub: Walk-in shower (reports floor of shower is very slippery.)   Bathroom Toilet: Handicapped height     Home Equipment: Agricultural consultant (2 wheels);Grab bars - tub/shower;Shower seat - built in;Shower seat;Hand held shower head;Toilet riser;Grab bars - toilet;Lift chair;Adaptive equipment Adaptive Equipment: Sock aid Additional Comments: daughter works 12 hour days 3 days a week and lives in [place].      Prior Functioning/Environment Prior Level of Function : Independent/Modified Independent;Needs assist       Physical Assist : ADLs (physical)   ADLs (physical): IADLs Mobility Comments: Pt reports recent use of RW due to LE lymphedema. Daughter assists with one step to enter home.  Dtr comes every 2 weeks to assist with housekeeping, laundry, making bed. ADLs  Comments: daughter does grocery shopping and has meals on wheels delivered. Able to complete ADLs in the home    OT Problem List: Decreased activity tolerance;Decreased range of motion;Decreased strength;Decreased knowledge of use of DME or AE;Impaired balance (sitting and/or standing);Pain   OT Treatment/Interventions: Self-care/ADL training;Therapeutic activities;Energy conservation;Patient/family education;Balance training;DME and/or AE instruction      OT Goals(Current goals can be found in the care plan section)   Acute Rehab OT Goals Patient  Stated Goal: To go home OT Goal Formulation: With patient Time For Goal Achievement: 08/11/23 Potential to Achieve Goals: Good ADL Goals Pt Will Perform Lower Body Bathing: with adaptive equipment;sitting/lateral leans;with modified independence Pt Will Perform Lower Body Dressing: with modified independence;with adaptive equipment Pt Will Transfer to Toilet: with modified independence;ambulating Pt Will Perform Toileting - Clothing Manipulation and hygiene: with modified independence Additional ADL Goal #1: Pt will engage in 8 min functional activities without loss of standing balance, in order to demonstrate improved activity tolerance and balance needed to perform ADLs safely at home. Additional ADL Goal #2: Patient will identify at least 3 fall prevention strategies to employ at home in order to maximize function and safety during ADLs and decrease caregiver burden while preventing possible injury and rehospitalization.   OT Frequency:  Min 1X/week    Co-evaluation              AM-PAC OT "6 Clicks" Daily Activity     Outcome Measure Help from another person eating meals?: A Little Help from another person taking care of personal grooming?: A Little Help from another person toileting, which includes using toliet, bedpan, or urinal?: A Lot Help from another person bathing (including washing, rinsing, drying)?: A Lot Help from another person to put on and taking off regular upper body clothing?: A Little Help from another person to put on and taking off regular lower body clothing?: Total 6 Click Score: 14   End of Session Equipment Utilized During Treatment: Gait belt;Rolling walker (2 wheels) Nurse Communication: Mobility status (no chair alarm box in room/)  Activity Tolerance: Patient limited by fatigue Patient left: in chair;with call bell/phone within reach  OT Visit Diagnosis: Muscle weakness (generalized) (M62.81);Pain;Unsteadiness on feet (R26.81) Pain - Right/Left:  Left Pain - part of body: Leg                Time: 4098-1191 OT Time Calculation (min): 65 min Charges:  OT General Charges $OT Visit: 1 Visit OT Evaluation $OT Eval Low Complexity: 1 Low OT Treatments $Self Care/Home Management : 8-22 mins $Therapeutic Activity: 8-22 mins $Cognitive Funtion inital: Initial 15 mins  Victorino Dike, OT Acute Rehab Services Office: [phone] 07/28/2023   Theodoro Clock 07/28/2023, 2:11 PM

## 2023-07-28 NOTE — Consult Note (Addendum)
 WOC Nurse Consult Note: Reason for Consult: Consult requested for bilat legs.  Performed remotely after review of progress notes and photos in the EMR.  Bilat legs with generalized erythremia and cellulitis.  Left anterior and posterior calf with 2 full thickness wounds; red and moist with mod amt yellow drainage.  Dressing procedure/placement/frequency: Topical treatment orders provided for bedside nurses to perform as follows to absorb drainage and provide antimicrobial benefits: Apply Aquacel to left anterior and posterior leg wounds Q day, and cover with foam dressings.  Change foam dressings Q 3 days or PRN soiling.  Please re-consult if further assistance is needed.  Thank-you,  Cammie Mcgee MSN, RN, CWOCN, [place], CNS [phone]

## 2023-07-28 NOTE — Progress Notes (Signed)
 PROGRESS NOTE    Susan Pitts  GNF:[id number] DOB: [birth year] DOA: 07/27/2023 PCP: Garlan Fillers, MD    Brief Narrative:   Susan Pitts is a 89 y.o. female with past medical history significant for chronic diastolic congestive heart failure, essential hypertension, pulmonary hypertension, lower extremity lymphedema, anxiety who presented to [facility] ED from home via EMS on 07/27/2023 with worsening lower extremity edema, difficulty ambulating and weeping wounds from her legs.  Patient currently lives home alone, daughter lives close by.  Usually able to perform most ADLs on her own with some help by her daughter who visits her a few days a week.  She has been on Lasix for a long time but due to hassle of going to the bathroom frequently has been using less lately and as needed only.  Since, has noted progressively worsening bilateral lower extremity edema with weeping of her legs.  She has Unna boots but has difficulty putting them on and off.  Has had decreased mobility over the last few days.  Given the progression of her symptoms she sought further care in the ED.  In the ED, temperature 98.2 F, HR 75, RR 19, BP 163/68, SpO2 95% room air.  WBC 6.6, hemoglobin 14.4, platelet count 173.  Sodium 140, potassium 3.8, chloride 103, CO2 23, glucose 110, BUN 41, Cram 1.06.  AST 49, ALT 32, total bilirubin 1.6.  BNP 1017.3.  Chest x-ray with interval cardiomegaly and pulmonary edema with small bilateral pleural effusions. EKG with normal sinus rhythm at 76 bpm, no ST-T wave changes, QTc 409 ms.  Patient was given IV Lasix 40 mg x 1.  TRH consulted for admission for further evaluation management of acute on chronic CHF exacerbation.  Assessment & Plan:    Acute on chronic diastolic congestive heart failure exacerbation Patient presenting with Gress of shortness of breath, worsening lower extremity edema.  Home Lasix dose has been reduced over the last several months.  TTE with LVEF 55 to  60%, LV with no R WMA, moderate asymmetric LVH, left ventricular diastolic function cannot be evaluated, RV systolic function mildly reduced, LA severely dilated, moderate/severe TR, no aortic stenosis, IVC normal in size. -- Furosemide 40 mg IV every 12 hours -- Strict I's and O's and daily weights -- BMP daily  Essential hypertension Home regimen includes benazepril 40 mg p.o. twice daily, metoprolol succinate 50 mg p.o. daily -- Hold home benazepril for now -- Continue metoprolol succinate 50 mg p.o. daily -- Continue IV Lasix as above -- Continue to monitor BP closely  Bilateral lower extremity lymphedema/wounds -- Wound care consulted -- Once wound care orders in, will place order for Unna boots  Anxiety -- Diazepam 5 mg p.o. twice daily as needed for anxiety  Weakness/debility/deconditioning -- PT/OT evaluation: Pending -- TOC consulted for anticipated need of placement  DVT prophylaxis: enoxaparin (LOVENOX) injection 30 mg Start: 07/28/23 1000    Code Status: Full Code Family Communication: Updated daughter present at bedside this morning  Disposition Plan:  Level of care: Telemetry Status is: Inpatient Remains inpatient appropriate because: IV diuresis    Consultants:  None  Procedures:  TTE  Antimicrobials:  None   Subjective: Patient seen examined bedside, lying in bed.  Daughter present at bedside.  Reports her swelling is slightly improved but concerned about the redness of her lower extremities.  Discussed with patient needs to maintain leg elevation and will likely resume Unna boots once wound care orders were placed.  Patient reports has had difficulty recently ambulating throughout her home due to the worsening lower extremity edema.  Issues able to perform most ADLs on her own and was driving on her own up until about a year ago in which she sustained a fall at home.  Daughter is very supportive, checks on her regularly at home; but she does continue to  work full-time and able to be there 24 hours a day for her mother.  Patient and daughter with no other specific concerns, complaints or questions at this time.  Patient denies headache, no chest pain, no palpitations, no shortness of breath, no abdominal pain, no fever/chills/night sweats, no nausea/vomiting/diarrhea, no focal weakness, no fatigue, no paresthesias.  No acute events overnight per nursing staff.  Objective: Vitals:   07/28/23 0443 07/28/23 0500 07/28/23 0801 07/28/23 1343  BP: (!) 154/74  (!) 164/84 (!) 147/61  Pulse: 70  66 67  Resp: 16  20 18   Temp: 98.3 F (36.8 C)  98.2 F (36.8 C) (!) 97.5 F (36.4 C)  TempSrc:   Oral Oral  SpO2: 99%  98% 100%  Weight:  64.4 kg    Height:        Intake/Output Summary (Last 24 hours) at 07/28/2023 1404 Last data filed at 07/28/2023 0400 Gross per 24 hour  Intake 240 ml  Output 1000 ml  Net -760 ml   Filed Weights   07/27/23 1947 07/28/23 0500  Weight: 64.4 kg 64.4 kg    Examination:  Physical Exam: GEN: NAD, alert and oriented x 3, elderly in appearance HEENT: NCAT, PERRL, EOMI, sclera clear, MMM PULM: CTAB w/o wheezes/crackles, normal respiratory effort, on room air CV: RRR w/o M/G/R GI: abd soft, NTND, + BS MSK: Moves all extremities independently, significant lower extremity pitting edema 3+ up to the level of knee, erythematous changes bilateral lower extremities with weeping wounds to left lower extremity as depicted below NEURO: No focal neurological deficits PSYCH: normal mood/affect Integumentary: Lower extremity wounds as depicted below, otherwise no other concerning rashes/lesions/wounds noted on exposed skin surfaces          Data Reviewed: I have personally reviewed following labs and imaging studies  CBC: Recent Labs  Lab 07/27/23 1431 07/28/23 0403  WBC 6.9 5.4  NEUTROABS 5.4  --   HGB 14.4 13.9  HCT 42.4 43.4  MCV 100.5* 101.6*  PLT 173 136*   Basic Metabolic Panel: Recent Labs  Lab  07/27/23 1431 07/28/23 0403  NA 140 139  K 3.8 4.6  CL 103 105  CO2 23 24  GLUCOSE 110* 83  BUN 41* 41*  CREATININE 1.06* 1.13*  CALCIUM 9.2 8.9  MG 2.0  --    GFR: Estimated Creatinine Clearance: 25.7 mL/min (A) (by C-G formula based on SCr of 1.13 mg/dL (H)). Liver Function Tests: Recent Labs  Lab 07/27/23 1431  AST 49*  ALT 32  ALKPHOS 87  BILITOT 1.6*  PROT 7.0  ALBUMIN 3.9   No results for input(s): "LIPASE", "AMYLASE" in the last 168 hours. No results for input(s): "AMMONIA" in the last 168 hours. Coagulation Profile: No results for input(s): "INR", "PROTIME" in the last 168 hours. Cardiac Enzymes: No results for input(s): "CKTOTAL", "CKMB", "CKMBINDEX", "TROPONINI" in the last 168 hours. BNP (last 3 results) No results for input(s): "PROBNP" in the last 8760 hours. HbA1C: No results for input(s): "HGBA1C" in the last 72 hours. CBG: No results for input(s): "GLUCAP" in the last 168 hours. Lipid Profile: No results for  input(s): "CHOL", "HDL", "LDLCALC", "TRIG", "CHOLHDL", "LDLDIRECT" in the last 72 hours. Thyroid Function Tests: No results for input(s): "TSH", "T4TOTAL", "FREET4", "T3FREE", "THYROIDAB" in the last 72 hours. Anemia Panel: No results for input(s): "VITAMINB12", "FOLATE", "FERRITIN", "TIBC", "IRON", "RETICCTPCT" in the last 72 hours. Sepsis Labs: No results for input(s): "PROCALCITON", "LATICACIDVEN" in the last 168 hours.  No results found for this or any previous visit (from the past 240 hours).       Radiology Studies: ECHOCARDIOGRAM COMPLETE Result Date: 07/28/2023    ECHOCARDIOGRAM REPORT   Patient Name:   Susan Pitts Date of Exam: 07/28/2023 Medical Rec #:  [id number]    Height:       63.0 in Accession #:    [id number]   Weight:       142.0 lb Date of Birth:  [birth year]    BSA:          1.672 m Patient Age:    89 years     BP:           164/84 mmHg Patient Gender: F            HR:           68 bpm. Exam Location:  Inpatient Procedure: 2D  Echo, Cardiac Doppler and Color Doppler (Both Spectral and Color            Flow Doppler were utilized during procedure). Indications:    CHF  History:        Patient has prior history of Echocardiogram examinations, most                 recent 12/01/2020. CHF, PAD; Risk Factors:Hypertension.  Sonographer:    Amy Chionchio Referring Phys: [id number] BINAYA DAHAL IMPRESSIONS  1. Left ventricular ejection fraction, by estimation, is 55 to 60%. The left ventricle has normal function. The left ventricle has no regional wall motion abnormalities. There is moderate asymmetric left ventricular hypertrophy of the septal segment. Left ventricular diastolic function could not be evaluated.  2. Right ventricular systolic function is mildly reduced. The right ventricular size is normal.  3. Left atrial size was severely dilated.  4. The mitral valve is normal in structure. Moderate mitral valve regurgitation. No evidence of mitral stenosis. The mean mitral valve gradient is 2.0 mmHg.  5. Tricuspid valve regurgitation is moderate to severe.  6. The aortic valve was not well visualized. There is mild calcification of the aortic valve. There is mild thickening of the aortic valve. Aortic valve regurgitation is mild to moderate. No aortic stenosis is present.  7. The inferior vena cava is normal in size with greater than 50% respiratory variability, suggesting right atrial pressure of 3 mmHg. Comparison(s): Prior images reviewed side by side. Valve regurgitation has increased. FINDINGS  Left Ventricle: Left ventricular ejection fraction, by estimation, is 55 to 60%. The left ventricle has normal function. The left ventricle has no regional wall motion abnormalities. Strain imaging was not performed. The left ventricular internal cavity  size was normal in size. There is moderate asymmetric left ventricular hypertrophy of the septal segment. Left ventricular diastolic function could not be evaluated due to mitral regurgitation (moderate  or greater). Left ventricular diastolic function could not be evaluated. Right Ventricle: The right ventricular size is normal. No increase in right ventricular wall thickness. Right ventricular systolic function is mildly reduced. Left Atrium: Left atrial size was severely dilated. Right Atrium: Right atrial size was normal in size. Pericardium: There is no evidence  of pericardial effusion. Mitral Valve: The mitral valve is normal in structure. Moderate mitral valve regurgitation. No evidence of mitral valve stenosis. MV peak gradient, 5.5 mmHg. The mean mitral valve gradient is 2.0 mmHg with average heart rate of 69 bpm. Tricuspid Valve: The tricuspid valve is normal in structure. Tricuspid valve regurgitation is moderate to severe. No evidence of tricuspid stenosis. Aortic Valve: The aortic valve was not well visualized. There is mild calcification of the aortic valve. There is mild thickening of the aortic valve. Aortic valve regurgitation is mild to moderate. Aortic regurgitation PHT measures 556 msec. No aortic stenosis is present. Aortic valve mean gradient measures 9.0 mmHg. Aortic valve peak gradient measures 14.5 mmHg. Aortic valve area, by VTI measures 0.92 cm. Pulmonic Valve: The pulmonic valve was normal in structure. Pulmonic valve regurgitation is not visualized. No evidence of pulmonic stenosis. Aorta: The aortic root and ascending aorta are structurally normal, with no evidence of dilitation. Venous: The inferior vena cava is normal in size with greater than 50% respiratory variability, suggesting right atrial pressure of 3 mmHg. IAS/Shunts: The atrial septum is grossly normal. Additional Comments: 3D imaging was not performed.  LEFT VENTRICLE PLAX 2D LVIDd:         4.60 cm     Diastology LVIDs:         3.70 cm     LV e' medial:    3.81 cm/s LV PW:         1.00 cm     LV E/e' medial:  32.3 LV IVS:        1.40 cm     LV e' lateral:   7.40 cm/s LVOT diam:     2.00 cm     LV E/e' lateral: 16.6 LV SV:          40 LV SV Index:   24 LVOT Area:     3.14 cm  LV Volumes (MOD) LV vol d, MOD A2C: 84.5 ml LV vol d, MOD A4C: 91.7 ml LV vol s, MOD A2C: 38.2 ml LV vol s, MOD A4C: 31.3 ml LV SV MOD A2C:     46.3 ml LV SV MOD A4C:     91.7 ml LV SV MOD BP:      52.5 ml RIGHT VENTRICLE          IVC RV Basal diam:  3.50 cm  IVC diam: 2.20 cm RV Mid diam:    2.90 cm TAPSE (M-mode): 1.0 cm LEFT ATRIUM             Index        RIGHT ATRIUM           Index LA Vol (A2C):   95.6 ml 57.19 ml/m  RA Area:     15.70 cm LA Vol (A4C):   95.1 ml 56.89 ml/m  RA Volume:   40.10 ml  23.99 ml/m LA Biplane Vol: 96.1 ml 57.49 ml/m  AORTIC VALVE                     PULMONIC VALVE AV Area (Vmax):    1.09 cm      PV Vmax:          0.72 m/s AV Area (Vmean):   0.98 cm      PV Peak grad:     2.1 mmHg AV Area (VTI):     0.92 cm      PR End Diast Vel: 5.66 msec AV Vmax:  190.50 cm/s AV Vmean:          139.000 cm/s AV VTI:            0.438 m AV Peak Grad:      14.5 mmHg AV Mean Grad:      9.0 mmHg LVOT Vmax:         66.25 cm/s LVOT Vmean:        43.300 cm/s LVOT VTI:          0.128 m LVOT/AV VTI ratio: 0.29 AI PHT:            556 msec  AORTA Ao Root diam: 3.50 cm Ao Asc diam:  3.30 cm MITRAL VALVE                  TRICUSPID VALVE MV Area (PHT): 4.83 cm       TR Peak grad:   35.3 mmHg MV Area VTI:   1.47 cm       TR Vmax:        297.00 cm/s MV Peak grad:  5.5 mmHg MV Mean grad:  2.0 mmHg       SHUNTS MV Vmax:       1.17 m/s       Systemic VTI:  0.13 m MV Vmean:      68.5 cm/s      Systemic Diam: 2.00 cm MV Decel Time: 157 msec MR Peak grad:    101.6 mmHg MR Mean grad:    70.0 mmHg MR Vmax:         504.00 cm/s MR Vmean:        401.0 cm/s MR PISA:         1.57 cm MR PISA Eff ROA: 12 mm MR PISA Radius:  0.50 cm MV E velocity: 123.00 cm/s MV A velocity: 42.70 cm/s MV E/A ratio:  2.88 Riley Lam MD Electronically signed by Riley Lam MD Signature Date/Time: 07/28/2023/12:32:34 PM    Final    DG Chest Port 1 View Result  Date: 07/27/2023 CLINICAL DATA:  Fatigue, weeping edema of the lower extremities. EXAM: PORTABLE CHEST 1 VIEW COMPARISON:  12/24/2021 FINDINGS: Interval enlarged cardiac silhouette. The aorta remains tortuous and partially calcified. Interval increased prominence of the pulmonary vasculature and interstitial markings, including Kerley lines. Interval small bilateral pleural effusions. Lower thoracic spine degenerative changes. Diffuse osteopenia. IMPRESSION: Interval cardiomegaly and pulmonary edema with small bilateral pleural effusions. Electronically Signed   By: Beckie Salts M.D.   On: 07/27/2023 15:35        Scheduled Meds:  enoxaparin (LOVENOX) injection  30 mg Subcutaneous Q24H   furosemide  40 mg Intravenous BID   metoprolol succinate  50 mg Oral Daily   potassium chloride  40 mEq Oral BID   Continuous Infusions:   LOS: 1 day    Time spent: 53 minutes spent on chart review, discussion with nursing staff, consultants, updating family and interview/physical exam; more than 50% of that time was spent in counseling and/or coordination of care.    Alvira Philips [place], DO [facility] Available via Epic secure chat 7am-7pm After these hours, please refer to coverage provider listed on [email or web address] 07/28/2023, 2:04 PM

## 2023-07-29 DIAGNOSIS — I5033 Acute on chronic diastolic (congestive) heart failure: Secondary | ICD-10-CM | POA: Diagnosis not present

## 2023-07-29 LAB — BASIC METABOLIC PANEL
Anion gap: 11 (ref 5–15)
BUN: 41 mg/dL — ABNORMAL HIGH (ref 8–23)
CO2: 25 mmol/L (ref 22–32)
Calcium: 9 mg/dL (ref 8.9–10.3)
Chloride: 101 mmol/L (ref 98–111)
Creatinine, Ser: 1.08 mg/dL — ABNORMAL HIGH (ref 0.44–1.00)
GFR, Estimated: 47 mL/min — ABNORMAL LOW (ref >60)
Glucose, Bld: 89 mg/dL (ref 70–99)
Potassium: 4 mmol/L (ref 3.5–5.1)
Sodium: 137 mmol/L (ref 135–145)

## 2023-07-29 LAB — BRAIN NATRIURETIC PEPTIDE: B Natriuretic Peptide: 874 pg/mL — ABNORMAL HIGH (ref 0.0–100.0)

## 2023-07-29 MED ORDER — HYDROCODONE-ACETAMINOPHEN 5-325 MG PO TABS
1.0000 | ORAL_TABLET | Freq: Four times a day (QID) | ORAL | Status: DC | PRN
  Administered 2023-07-29 – 2023-07-31 (×4): 1 via ORAL
  Filled 2023-07-29 (×5): qty 1

## 2023-07-29 MED ORDER — FUROSEMIDE 10 MG/ML IJ SOLN
40.0000 mg | Freq: Every day | INTRAMUSCULAR | Status: DC
  Administered 2023-07-30: 40 mg via INTRAVENOUS
  Filled 2023-07-29: qty 4

## 2023-07-29 NOTE — TOC Progression Note (Signed)
 Transition of Care [facility]) - Progression Note   Patient Details  Name: Susan Pitts MRN: [id number] Date of Birth: 15-May-1924  Transition of Care [facility] CM/SW Contact  Ewing Schlein, LCSW Phone Number: 07/29/2023, 10:45 AM  Clinical Narrative: PT and OT evaluations recommended SNF. CSW spoke with patient regarding recommendations. Patient expressed some hesitancy with SNF, but is agreeable to being faxed out to higher rated facilities and requested that the referral not be sent to [facility]. FL2 done; PASRR confirmed. Initial referral faxed out referral in hub.    Expected Discharge Plan: Skilled Nursing Facility Barriers to Discharge: Continued Medical Work up, English as a second language teacher, SNF Pending bed offer  Expected Discharge Plan and Services In-house Referral: Clinical Social Work Living arrangements for the past 2 months: Single Family Home  Social Determinants of Health (SDOH) Interventions SDOH Screenings   Food Insecurity: No Food Insecurity (07/27/2023)  Housing: Low Risk  (07/27/2023)  Transportation Needs: Unmet Transportation Needs (07/27/2023)  Utilities: Not At Risk (07/27/2023)  Social Connections: Moderately Integrated (07/27/2023)  Tobacco Use: Low Risk  (07/27/2023)   Readmission Risk Interventions     No data to display

## 2023-07-29 NOTE — [place] FL2 (Signed)
 [place] MEDICAID FL2 LEVEL OF CARE FORM     IDENTIFICATION  Patient Name: Susan Pitts Birthdate: Mar 12, 1924 Sex: female Admission Date (Current Location): 07/27/2023  [facility] and [place] Number:  Producer, television/film/video and Address:  [facility],  [address] [place], [place] [address]      Provider Number: [id number]  Attending Physician Name and Address:  [place], Eric J, DO  Relative Name and Phone Number:  Malachy Moan (daughter) Ph: [phone]    Current Level of Care: Hospital Recommended Level of Care: Skilled Nursing Facility Prior Approval Number:    Date Approved/Denied: 07/29/23 PASRR Number: [id number]  Discharge Plan: SNF    Current Diagnoses: Patient Active Problem List   Diagnosis Date Noted   Closed T2 fracture (HCC) 12/24/2021   CKD (chronic kidney disease), stage III (HCC) 12/09/2020   Unspecified protein-calorie malnutrition (HCC) 12/09/2020   Acute on chronic diastolic CHF (congestive heart failure) (HCC) 12/04/2020   Pulmonary hypertension (HCC) 12/04/2020   PVD (peripheral vascular disease) (HCC) 12/04/2020   Dyspnea on exertion 12/04/2020   Anxiety 12/04/2020   DJD (degenerative joint disease) 12/04/2020   Acute CHF (congestive heart failure) (HCC) 11/30/2020   Chronic pain 09/03/2020   Low back pain 09/03/2020   Pain due to onychomycosis of toenails of both feet 11/21/2018   Clavi 11/21/2018   Peripheral vascular disease (HCC) 12/20/2017   Hand arthritis 01/13/2017   Abnormal gait 12/12/2013   Major depression, single episode 12/12/2013   Fall 10/24/2013   Gallstones 12/27/2011   Microscopic hematuria    Obesity 03/03/2011   Essential hypertension 10/28/2010   Long term (current) use of opiate analgesic 06/17/2009   Anxiety disorder 06/17/2009   Constipation 06/17/2009   Osteoarthritis 06/17/2009    Orientation RESPIRATION BLADDER Height & Weight     Self, Time, Situation, Place  Normal Incontinent Weight: 142 lb  13.7 oz (64.8 kg) Height:  5\' 3"  (160 cm)  BEHAVIORAL SYMPTOMS/MOOD NEUROLOGICAL BOWEL NUTRITION STATUS      Continent Diet (2 gram sodium diet)  AMBULATORY STATUS COMMUNICATION OF NEEDS Skin   Limited Assist Verbally Skin abrasions, Other (Comment) (Abrasions: bilateral arms, back; Blister: left lower leg; Ecchymosis: bilateral arms; Erythema: bilateral arms, back, chest; Weeping: bilateral legs)                       Personal Care Assistance Level of Assistance  Bathing, Feeding, Dressing Bathing Assistance: Limited assistance Feeding assistance: Independent Dressing Assistance: Limited assistance     Functional Limitations Info  Sight, Hearing, Speech Sight Info: Impaired Hearing Info: Adequate Speech Info: Adequate    SPECIAL CARE FACTORS FREQUENCY  PT (By licensed PT), OT (By licensed OT)     PT Frequency: 5x's/week OT Frequency: 5x's/week            Contractures Contractures Info: Not present    Additional Factors Info  Code Status, Allergies, Psychotropic Code Status Info: Full Allergies Info: Other, Alendronate Sodium, Cardura (Doxazosin Mesylate), Cardura (Doxazosin), Carvedilol, Codeine, Fosamax (Alendronate), Inderal (Propranolol), Penicillin G Sodium, Shrimp (Diagnostic), Penicillins Psychotropic Info: Valium         Current Medications (07/29/2023):  This is the current hospital active medication list Current Facility-Administered Medications  Medication Dose Route Frequency Provider Last Rate Last Admin   acetaminophen (TYLENOL) tablet 650 mg  650 mg Oral Q6H PRN Dahal, Melina Schools, MD   650 mg at 07/28/23 2158   Or   acetaminophen (TYLENOL) suppository 650 mg  650 mg  Rectal Q6H PRN Dahal, Melina Schools, MD       albuterol (PROVENTIL) (2.5 MG/3ML) 0.083% nebulizer solution 2.5 mg  2.5 mg Nebulization Q6H PRN Dahal, Melina Schools, MD       bisacodyl (DULCOLAX) EC tablet 5 mg  5 mg Oral Daily PRN Dahal, Melina Schools, MD       diazepam (VALIUM) tablet 5 mg  5 mg Oral BID PRN  Lorin Glass, MD   5 mg at 07/29/23 0916   enoxaparin (LOVENOX) injection 30 mg  30 mg Subcutaneous Q24H Len Childs T, RPH   30 mg at 07/29/23 0909   [START ON 07/30/2023] furosemide (LASIX) injection 40 mg  40 mg Intravenous Daily [place], Eric J, DO       hydrALAZINE (APRESOLINE) injection 10 mg  10 mg Intravenous Q6H PRN Dahal, Melina Schools, MD   10 mg at 07/28/23 2200   HYDROcodone-acetaminophen (NORCO/VICODIN) 5-325 MG per tablet 1 tablet  1 tablet Oral QID PRN [place], Eric J, DO       metoprolol succinate (TOPROL-XL) 24 hr tablet 50 mg  50 mg Oral Daily Dahal, Binaya, MD   50 mg at 07/29/23 0911   polyethylene glycol (MIRALAX / GLYCOLAX) packet 17 g  17 g Oral Daily PRN Dahal, Melina Schools, MD       potassium chloride (KLOR-CON) packet 40 mEq  40 mEq Oral BID Melene Plan, DO   40 mEq at 07/29/23 0911     Discharge Medications: Please see discharge summary for a list of discharge medications.  Relevant Imaging Results:  Relevant Lab Results:   Additional Information SSN: [id number]  Ewing Schlein, LCSW

## 2023-07-29 NOTE — Progress Notes (Signed)
 Heart Failure Navigator Progress Note  Assessed for Heart & Vascular TOC clinic readiness.  Patient does not meet criteria due to EF 55-60%, plan for either SNF or Home Health at discharge. No HF TOC. .   Navigator will sign off at this time.   Rhae Hammock, BSN, Scientist, clinical (histocompatibility and immunogenetics) Only

## 2023-07-29 NOTE — Progress Notes (Signed)
 PROGRESS NOTE    Susan Pitts  ZOX:[id number] DOB: 06-20-1923 DOA: 07/27/2023 PCP: Garlan Fillers, MD    Brief Narrative:   Susan Pitts is a 89 y.o. female with past medical history significant for chronic diastolic congestive heart failure, essential hypertension, pulmonary hypertension, lower extremity lymphedema, anxiety who presented to [facility] ED from home via EMS on 07/27/2023 with worsening lower extremity edema, difficulty ambulating and weeping wounds from her legs.  Patient currently lives home alone, daughter lives close by.  Usually able to perform most ADLs on her own with some help by her daughter who visits her a few days a week.  She has been on Lasix for a long time but due to hassle of going to the bathroom frequently has been using less lately and as needed only.  Since, has noted progressively worsening bilateral lower extremity edema with weeping of her legs.  She has Unna boots but has difficulty putting them on and off.  Has had decreased mobility over the last few days.  Given the progression of her symptoms she sought further care in the ED.  In the ED, temperature 98.2 F, HR 75, RR 19, BP 163/68, SpO2 95% room air.  WBC 6.6, hemoglobin 14.4, platelet count 173.  Sodium 140, potassium 3.8, chloride 103, CO2 23, glucose 110, BUN 41, Cram 1.06.  AST 49, ALT 32, total bilirubin 1.6.  BNP 1017.3.  Chest x-ray with interval cardiomegaly and pulmonary edema with small bilateral pleural effusions. EKG with normal sinus rhythm at 76 bpm, no ST-T wave changes, QTc 409 ms.  Patient was given IV Lasix 40 mg x 1.  TRH consulted for admission for further evaluation management of acute on chronic CHF exacerbation.  Assessment & Plan:    Acute on chronic diastolic congestive heart failure exacerbation Patient presenting with Gress of shortness of breath, worsening lower extremity edema.  Home Lasix dose has been reduced over the last several months.  TTE with LVEF 55 to  60%, LV with no R WMA, moderate asymmetric LVH, left ventricular diastolic function cannot be evaluated, RV systolic function mildly reduced, LA severely dilated, moderate/severe TR, no aortic stenosis, IVC normal in size. -- net negative 3.2L past 24h and net negative 4.0L since admission -- Furosemide 40 mg IV q24h -- Strict I's and O's and daily weights -- BMP daily  Essential hypertension Home regimen includes benazepril 40 mg p.o. twice daily, metoprolol succinate 50 mg p.o. daily -- Hold home benazepril for now -- Continue metoprolol succinate 50 mg p.o. daily -- Continue IV Lasix as above -- Continue to monitor BP closely  Bilateral lower extremity lymphedema/wounds Aquacel to left anterior and posterior leg wounds Q day, and cover with foam dressings.  Change foam dressings Q 3 days or PRN soiling.   Anxiety -- Diazepam 5 mg p.o. twice daily as needed for anxiety  Weakness/debility/deconditioning -- PT/OT evaluation: Recommend SNF placement -- TOC consulted for anticipated need of placement; possible home health patient declines  DVT prophylaxis: enoxaparin (LOVENOX) injection 30 mg Start: 07/28/23 1000    Code Status: Full Code Family Communication: Updated daughter present at bedside this morning  Disposition Plan:  Level of care: Telemetry Status is: Inpatient Remains inpatient appropriate because: IV diuresis    Consultants:  None  Procedures:  TTE  Antimicrobials:  None   Subjective: Patient seen examined bedside, lying in bed.  No family present.  RN present at bedside.  Swelling remarkably improved past 24 hours  on IV Lasix.  Will reduce Lasix to once daily IV.  Renal function stable.  PT/OT recommend SNF placement, patient okay for faxing out for bed offers but may end up returning home with home health.  Patient with no other specific questions, concerns or complaints this morning.  Patient denies headache, no chest pain, no palpitations, no shortness of  breath, no abdominal pain, no fever/chills/night sweats, no nausea/vomiting/diarrhea, no focal weakness, no fatigue, no paresthesias.  No acute events overnight per nursing staff.  Objective: Vitals:   07/28/23 2140 07/28/23 2328 07/29/23 0500 07/29/23 0507  BP: (!) 166/75 127/63  134/61  Pulse: 71 77  74  Resp: 18   18  Temp: 98 F (36.7 C)   98.5 F (36.9 C)  TempSrc:    Oral  SpO2: 97%   97%  Weight:   64.8 kg   Height:        Intake/Output Summary (Last 24 hours) at 07/29/2023 1240 Last data filed at 07/29/2023 4098 Gross per 24 hour  Intake 480 ml  Output 2900 ml  Net -2420 ml   Filed Weights   07/27/23 1947 07/28/23 0500 07/29/23 0500  Weight: 64.4 kg 64.4 kg 64.8 kg    Examination:  Physical Exam: GEN: NAD, alert and oriented x 3, elderly in appearance HEENT: NCAT, PERRL, EOMI, sclera clear, MMM PULM: CTAB w/o wheezes/crackles, normal respiratory effort, on room air CV: RRR w/o M/G/R GI: abd soft, NTND, + BS MSK: Moves all extremities independently, significant lower extremity pitting edema 1+ up to the level of knee, erythematous changes bilateral lower extremities with weeping wounds to left lower extremity as depicted below NEURO: No focal neurological deficits PSYCH: normal mood/affect Integumentary: Lower extremity wounds as depicted below, otherwise no other concerning rashes/lesions/wounds noted on exposed skin surfaces          Data Reviewed: I have personally reviewed following labs and imaging studies  CBC: Recent Labs  Lab 07/27/23 1431 07/28/23 0403  WBC 6.9 5.4  NEUTROABS 5.4  --   HGB 14.4 13.9  HCT 42.4 43.4  MCV 100.5* 101.6*  PLT 173 136*   Basic Metabolic Panel: Recent Labs  Lab 07/27/23 1431 07/28/23 0403 07/29/23 0348  NA 140 139 137  K 3.8 4.6 4.0  CL 103 105 101  CO2 23 24 25   GLUCOSE 110* 83 89  BUN 41* 41* 41*  CREATININE 1.06* 1.13* 1.08*  CALCIUM 9.2 8.9 9.0  MG 2.0  --   --    GFR: Estimated Creatinine  Clearance: 27 mL/min (A) (by C-G formula based on SCr of 1.08 mg/dL (H)). Liver Function Tests: Recent Labs  Lab 07/27/23 1431  AST 49*  ALT 32  ALKPHOS 87  BILITOT 1.6*  PROT 7.0  ALBUMIN 3.9   No results for input(s): "LIPASE", "AMYLASE" in the last 168 hours. No results for input(s): "AMMONIA" in the last 168 hours. Coagulation Profile: No results for input(s): "INR", "PROTIME" in the last 168 hours. Cardiac Enzymes: No results for input(s): "CKTOTAL", "CKMB", "CKMBINDEX", "TROPONINI" in the last 168 hours. BNP (last 3 results) No results for input(s): "PROBNP" in the last 8760 hours. HbA1C: No results for input(s): "HGBA1C" in the last 72 hours. CBG: No results for input(s): "GLUCAP" in the last 168 hours. Lipid Profile: No results for input(s): "CHOL", "HDL", "LDLCALC", "TRIG", "CHOLHDL", "LDLDIRECT" in the last 72 hours. Thyroid Function Tests: No results for input(s): "TSH", "T4TOTAL", "FREET4", "T3FREE", "THYROIDAB" in the last 72 hours. Anemia Panel: No  results for input(s): "VITAMINB12", "FOLATE", "FERRITIN", "TIBC", "IRON", "RETICCTPCT" in the last 72 hours. Sepsis Labs: No results for input(s): "PROCALCITON", "LATICACIDVEN" in the last 168 hours.  No results found for this or any previous visit (from the past 240 hours).       Radiology Studies: ECHOCARDIOGRAM COMPLETE Result Date: 07/28/2023    ECHOCARDIOGRAM REPORT   Patient Name:   DANETTE WEINFELD Cragin Date of Exam: 07/28/2023 Medical Rec #:  [id number]    Height:       63.0 in Accession #:    [id number]   Weight:       142.0 lb Date of Birth:  1924-01-19    BSA:          1.672 m Patient Age:    89 years     BP:           164/84 mmHg Patient Gender: F            HR:           68 bpm. Exam Location:  Inpatient Procedure: 2D Echo, Cardiac Doppler and Color Doppler (Both Spectral and Color            Flow Doppler were utilized during procedure). Indications:    CHF  History:        Patient has prior history of  Echocardiogram examinations, most                 recent 12/01/2020. CHF, PAD; Risk Factors:Hypertension.  Sonographer:    Amy Chionchio Referring Phys: [id number] BINAYA DAHAL IMPRESSIONS  1. Left ventricular ejection fraction, by estimation, is 55 to 60%. The left ventricle has normal function. The left ventricle has no regional wall motion abnormalities. There is moderate asymmetric left ventricular hypertrophy of the septal segment. Left ventricular diastolic function could not be evaluated.  2. Right ventricular systolic function is mildly reduced. The right ventricular size is normal.  3. Left atrial size was severely dilated.  4. The mitral valve is normal in structure. Moderate mitral valve regurgitation. No evidence of mitral stenosis. The mean mitral valve gradient is 2.0 mmHg.  5. Tricuspid valve regurgitation is moderate to severe.  6. The aortic valve was not well visualized. There is mild calcification of the aortic valve. There is mild thickening of the aortic valve. Aortic valve regurgitation is mild to moderate. No aortic stenosis is present.  7. The inferior vena cava is normal in size with greater than 50% respiratory variability, suggesting right atrial pressure of 3 mmHg. Comparison(s): Prior images reviewed side by side. Valve regurgitation has increased. FINDINGS  Left Ventricle: Left ventricular ejection fraction, by estimation, is 55 to 60%. The left ventricle has normal function. The left ventricle has no regional wall motion abnormalities. Strain imaging was not performed. The left ventricular internal cavity  size was normal in size. There is moderate asymmetric left ventricular hypertrophy of the septal segment. Left ventricular diastolic function could not be evaluated due to mitral regurgitation (moderate or greater). Left ventricular diastolic function could not be evaluated. Right Ventricle: The right ventricular size is normal. No increase in right ventricular wall thickness. Right  ventricular systolic function is mildly reduced. Left Atrium: Left atrial size was severely dilated. Right Atrium: Right atrial size was normal in size. Pericardium: There is no evidence of pericardial effusion. Mitral Valve: The mitral valve is normal in structure. Moderate mitral valve regurgitation. No evidence of mitral valve stenosis. MV peak gradient, 5.5 mmHg. The mean mitral valve gradient  is 2.0 mmHg with average heart rate of 69 bpm. Tricuspid Valve: The tricuspid valve is normal in structure. Tricuspid valve regurgitation is moderate to severe. No evidence of tricuspid stenosis. Aortic Valve: The aortic valve was not well visualized. There is mild calcification of the aortic valve. There is mild thickening of the aortic valve. Aortic valve regurgitation is mild to moderate. Aortic regurgitation PHT measures 556 msec. No aortic stenosis is present. Aortic valve mean gradient measures 9.0 mmHg. Aortic valve peak gradient measures 14.5 mmHg. Aortic valve area, by VTI measures 0.92 cm. Pulmonic Valve: The pulmonic valve was normal in structure. Pulmonic valve regurgitation is not visualized. No evidence of pulmonic stenosis. Aorta: The aortic root and ascending aorta are structurally normal, with no evidence of dilitation. Venous: The inferior vena cava is normal in size with greater than 50% respiratory variability, suggesting right atrial pressure of 3 mmHg. IAS/Shunts: The atrial septum is grossly normal. Additional Comments: 3D imaging was not performed.  LEFT VENTRICLE PLAX 2D LVIDd:         4.60 cm     Diastology LVIDs:         3.70 cm     LV e' medial:    3.81 cm/s LV PW:         1.00 cm     LV E/e' medial:  32.3 LV IVS:        1.40 cm     LV e' lateral:   7.40 cm/s LVOT diam:     2.00 cm     LV E/e' lateral: 16.6 LV SV:         40 LV SV Index:   24 LVOT Area:     3.14 cm  LV Volumes (MOD) LV vol d, MOD A2C: 84.5 ml LV vol d, MOD A4C: 91.7 ml LV vol s, MOD A2C: 38.2 ml LV vol s, MOD A4C: 31.3 ml LV  SV MOD A2C:     46.3 ml LV SV MOD A4C:     91.7 ml LV SV MOD BP:      52.5 ml RIGHT VENTRICLE          IVC RV Basal diam:  3.50 cm  IVC diam: 2.20 cm RV Mid diam:    2.90 cm TAPSE (M-mode): 1.0 cm LEFT ATRIUM             Index        RIGHT ATRIUM           Index LA Vol (A2C):   95.6 ml 57.19 ml/m  RA Area:     15.70 cm LA Vol (A4C):   95.1 ml 56.89 ml/m  RA Volume:   40.10 ml  23.99 ml/m LA Biplane Vol: 96.1 ml 57.49 ml/m  AORTIC VALVE                     PULMONIC VALVE AV Area (Vmax):    1.09 cm      PV Vmax:          0.72 m/s AV Area (Vmean):   0.98 cm      PV Peak grad:     2.1 mmHg AV Area (VTI):     0.92 cm      PR End Diast Vel: 5.66 msec AV Vmax:           190.50 cm/s AV Vmean:          139.000 cm/s AV VTI:  0.438 m AV Peak Grad:      14.5 mmHg AV Mean Grad:      9.0 mmHg LVOT Vmax:         66.25 cm/s LVOT Vmean:        43.300 cm/s LVOT VTI:          0.128 m LVOT/AV VTI ratio: 0.29 AI PHT:            556 msec  AORTA Ao Root diam: 3.50 cm Ao Asc diam:  3.30 cm MITRAL VALVE                  TRICUSPID VALVE MV Area (PHT): 4.83 cm       TR Peak grad:   35.3 mmHg MV Area VTI:   1.47 cm       TR Vmax:        297.00 cm/s MV Peak grad:  5.5 mmHg MV Mean grad:  2.0 mmHg       SHUNTS MV Vmax:       1.17 m/s       Systemic VTI:  0.13 m MV Vmean:      68.5 cm/s      Systemic Diam: 2.00 cm MV Decel Time: 157 msec MR Peak grad:    101.6 mmHg MR Mean grad:    70.0 mmHg MR Vmax:         504.00 cm/s MR Vmean:        401.0 cm/s MR PISA:         1.57 cm MR PISA Eff ROA: 12 mm MR PISA Radius:  0.50 cm MV E velocity: 123.00 cm/s MV A velocity: 42.70 cm/s MV E/A ratio:  2.88 Riley Lam MD Electronically signed by Riley Lam MD Signature Date/Time: 07/28/2023/12:32:34 PM    Final    DG Chest Port 1 View Result Date: 07/27/2023 CLINICAL DATA:  Fatigue, weeping edema of the lower extremities. EXAM: PORTABLE CHEST 1 VIEW COMPARISON:  12/24/2021 FINDINGS: Interval enlarged cardiac silhouette.  The aorta remains tortuous and partially calcified. Interval increased prominence of the pulmonary vasculature and interstitial markings, including Kerley lines. Interval small bilateral pleural effusions. Lower thoracic spine degenerative changes. Diffuse osteopenia. IMPRESSION: Interval cardiomegaly and pulmonary edema with small bilateral pleural effusions. Electronically Signed   By: Beckie Salts M.D.   On: 07/27/2023 15:35        Scheduled Meds:  enoxaparin (LOVENOX) injection  30 mg Subcutaneous Q24H   [START ON 07/30/2023] furosemide  40 mg Intravenous Daily   metoprolol succinate  50 mg Oral Daily   potassium chloride  40 mEq Oral BID   Continuous Infusions:   LOS: 2 days    Time spent: 53 minutes spent on chart review, discussion with nursing staff, consultants, updating family and interview/physical exam; more than 50% of that time was spent in counseling and/or coordination of care.    Alvira Philips [place], DO [facility] Available via Epic secure chat 7am-7pm After these hours, please refer to coverage provider listed on [email or web address] 07/29/2023, 12:40 PM

## 2023-07-29 NOTE — TOC Progression Note (Addendum)
 Transition of Care [facility]) - Progression Note   Patient Details  Name: Susan Pitts MRN: [id number] Date of Birth: Sep 24, 1923  [facility] CM/SW Contact  Ewing Schlein, LCSW Phone Number: 07/29/2023, 1:35 PM  Clinical Narrative: Patient received the following bed offers with Medicare star ratings:   [facility] [address] [place], [place] [address] [phone] Overall rating ????? Much above average  [facility] [address] [place], [place] [address] [phone] Overall rating ???? Above average  [facility] [address] [place], [place] [address] [phone] Overall rating ????? Much above average  [facility] [address] [place], [place] [address] [phone] Overall rating ???? Above average  CSW provided patient with list of bed offers. Patient remains undecided about going to SNF versus going home with HHPT/OT. Patient would like to discuss SNF and the bed offers with her daughter. CSW left voicemail for daughter, Malachy Moan, regarding SNF recommendation and bed offers.  Addendum: CSW received call from daughter and after reviewing bed offers with the patient, she would like her to go to [facility]. CSW confirmed bed with [place] in admissions at [place], but a bed will not be available until 08/01/23. CSW started insurance authorization, which is pending. Reference ID # is: [id number].  Expected Discharge Plan: Skilled Nursing Facility Barriers to Discharge: Continued Medical Work up, English as a second language teacher, SNF Pending bed offer  Expected Discharge Plan and Services In-house Referral: Clinical Social Work Living arrangements for the past 2 months: Single Family Home  Social Determinants of Health (SDOH) Interventions SDOH Screenings   Food Insecurity: No Food Insecurity (07/27/2023)  Housing: Low Risk  (07/27/2023)   Transportation Needs: Unmet Transportation Needs (07/27/2023)  Utilities: Not At Risk (07/27/2023)  Social Connections: Moderately Integrated (07/27/2023)  Tobacco Use: Low Risk  (07/27/2023)   Readmission Risk Interventions     No data to display

## 2023-07-29 NOTE — Progress Notes (Signed)
 Mobility Specialist - Progress Note   07/29/23 1106  Mobility  Activity Ambulated with assistance in hallway  Level of Assistance Minimal assist, patient does 75% or more  Assistive Device Front wheel walker  Distance Ambulated (ft) 60 ft  Range of Motion/Exercises Active  Activity Response Tolerated well  Mobility Referral Yes  Mobility visit 1 Mobility  Mobility Specialist Start Time (ACUTE ONLY) 1050  Mobility Specialist Stop Time (ACUTE ONLY) 1106  Mobility Specialist Time Calculation (min) (ACUTE ONLY) 16 min   Pt was found in bed and agreeable to ambulate. Grew fatigued with session. At EOS returned to recliner chair with all needs met. Call bell in reach. Chair alarm on.  Billey Chang Mobility Specialist

## 2023-07-29 NOTE — Plan of Care (Signed)
 Patient progressed well today.  Has been up to chair several times and walked with PT in hall short distance.  Working to get strength back to go home after some rehab.  Is motivated to get stronger.

## 2023-07-30 DIAGNOSIS — I890 Lymphedema, not elsewhere classified: Secondary | ICD-10-CM

## 2023-07-30 DIAGNOSIS — I5033 Acute on chronic diastolic (congestive) heart failure: Secondary | ICD-10-CM | POA: Diagnosis not present

## 2023-07-30 LAB — BASIC METABOLIC PANEL
Anion gap: 10 (ref 5–15)
BUN: 44 mg/dL — ABNORMAL HIGH (ref 8–23)
CO2: 28 mmol/L (ref 22–32)
Calcium: 8.9 mg/dL (ref 8.9–10.3)
Chloride: 99 mmol/L (ref 98–111)
Creatinine, Ser: 0.96 mg/dL (ref 0.44–1.00)
GFR, Estimated: 54 mL/min — ABNORMAL LOW (ref >60)
Glucose, Bld: 85 mg/dL (ref 70–99)
Potassium: 4.4 mmol/L (ref 3.5–5.1)
Sodium: 137 mmol/L (ref 135–145)

## 2023-07-30 MED ORDER — ENOXAPARIN SODIUM 40 MG/0.4ML IJ SOSY: 40.0000 mg | PREFILLED_SYRINGE | INTRAMUSCULAR | Status: DC

## 2023-07-30 MED ORDER — GERHARDT'S BUTT CREAM
TOPICAL_CREAM | Freq: Two times a day (BID) | CUTANEOUS | Status: DC
  Administered 2023-07-31: 1 via TOPICAL
  Filled 2023-07-30: qty 60

## 2023-07-30 NOTE — Progress Notes (Signed)
 PROGRESS NOTE    Susan Pitts  ZOX:[id number] DOB: 1923-10-05 DOA: 07/27/2023 PCP: Garlan Fillers, MD    Brief Narrative:   Susan Pitts is a 89 y.o. female with past medical history significant for chronic diastolic congestive heart failure, essential hypertension, pulmonary hypertension, lower extremity lymphedema, anxiety who presented to [facility] ED from home via EMS on 07/27/2023 with worsening lower extremity edema, difficulty ambulating and weeping wounds from her legs.  Patient currently lives home alone, daughter lives close by.  Usually able to perform most ADLs on her own with some help by her daughter who visits her a few days a week.  She has been on Lasix for a long time but due to hassle of going to the bathroom frequently has been using less lately and as needed only.  Since, has noted progressively worsening bilateral lower extremity edema with weeping of her legs.  She has Unna boots but has difficulty putting them on and off.  Has had decreased mobility over the last few days.  Given the progression of her symptoms she sought further care in the ED.  In the ED, temperature 98.2 F, HR 75, RR 19, BP 163/68, SpO2 95% room air.  WBC 6.6, hemoglobin 14.4, platelet count 173.  Sodium 140, potassium 3.8, chloride 103, CO2 23, glucose 110, BUN 41, Cram 1.06.  AST 49, ALT 32, total bilirubin 1.6.  BNP 1017.3.  Chest x-ray with interval cardiomegaly and pulmonary edema with small bilateral pleural effusions. EKG with normal sinus rhythm at 76 bpm, no ST-T wave changes, QTc 409 ms.  Patient was given IV Lasix 40 mg x 1.  TRH consulted for admission for further evaluation management of acute on chronic CHF exacerbation.  Assessment & Plan:    Acute on chronic diastolic congestive heart failure exacerbation Patient presenting with Gress of shortness of breath, worsening lower extremity edema.  Home Lasix dose has been reduced over the last several months.  TTE with LVEF 55 to  60%, LV with no R WMA, moderate asymmetric LVH, left ventricular diastolic function cannot be evaluated, RV systolic function mildly reduced, LA severely dilated, moderate/severe TR, no aortic stenosis, IVC normal in size. -- net negative 3.2L past 24h and net negative 4.0L since admission -- Furosemide 40 mg IV q24h -- Strict I's and O's and daily weights -- BMP daily  Essential hypertension Home regimen includes benazepril 40 mg p.o. twice daily, metoprolol succinate 50 mg p.o. daily -- Hold home benazepril for now -- Continue metoprolol succinate 50 mg p.o. daily -- Continue IV Lasix as above -- Continue to monitor BP closely  Bilateral lower extremity lymphedema/wounds Aquacel to left anterior and posterior leg wounds Q day, and cover with foam dressings.  Change foam dressings Q 3 days or PRN soiling.   Anxiety -- Diazepam 5 mg p.o. twice daily as needed for anxiety  Weakness/debility/deconditioning -- PT/OT evaluation: Recommend SNF placement -- TOC consulted for anticipated need of placement; possible home health patient declines  DVT prophylaxis: enoxaparin (LOVENOX) injection 30 mg Start: 07/28/23 1000    Code Status: Full Code Family Communication: Updated daughter present at bedside this morning  Disposition Plan:  Level of care: Telemetry Status is: Inpatient Remains inpatient appropriate because: IV diuresis    Consultants:  None  Procedures:  TTE  Antimicrobials:  None   Subjective: Patient seen examined bedside, lying in bed.  No family present.  Patient reports that her son passed away yesterday at [facility];  apparently he had been sick for some time with kidney/heart issues.  Patient reports her swelling continues to improve greatly in her lower extremities.  Awaiting discharge to SNF. Patient with no other specific questions, concerns or complaints this morning.  Patient denies headache, no chest pain, no palpitations, no shortness of breath, no  abdominal pain, no fever/chills/night sweats, no nausea/vomiting/diarrhea, no focal weakness, no fatigue, no paresthesias.  No acute events overnight per nursing staff.  Objective: Vitals:   07/29/23 2018 07/30/23 0438 07/30/23 0500 07/30/23 1048  BP: 126/60 132/68  (!) 124/54  Pulse: 75 69  70  Resp: 18 18    Temp: 98.1 F (36.7 C) 97.8 F (36.6 C)    TempSrc: Oral Oral    SpO2: 94% 96%    Weight:   64.4 kg   Height:        Intake/Output Summary (Last 24 hours) at 07/30/2023 1319 Last data filed at 07/29/2023 2200 Gross per 24 hour  Intake --  Output 1800 ml  Net -1800 ml   Filed Weights   07/28/23 0500 07/29/23 0500 07/30/23 0500  Weight: 64.4 kg 64.8 kg 64.4 kg    Examination:  Physical Exam: GEN: NAD, alert and oriented x 3, elderly in appearance HEENT: NCAT, PERRL, EOMI, sclera clear, MMM PULM: CTAB w/o wheezes/crackles, normal respiratory effort, on room air CV: RRR w/o M/G/R GI: abd soft, NTND, + BS MSK: Moves all extremities independently, significant lower extremity pitting edema 1+ up to the level of knee, erythematous changes bilateral lower extremities with weeping wounds to left lower extremity as depicted below NEURO: No focal neurological deficits PSYCH: normal mood/affect Integumentary: Lower extremity wounds as depicted below, otherwise no other concerning rashes/lesions/wounds noted on exposed skin surfaces          Data Reviewed: I have personally reviewed following labs and imaging studies  CBC: Recent Labs  Lab 07/27/23 1431 07/28/23 0403  WBC 6.9 5.4  NEUTROABS 5.4  --   HGB 14.4 13.9  HCT 42.4 43.4  MCV 100.5* 101.6*  PLT 173 136*   Basic Metabolic Panel: Recent Labs  Lab 07/27/23 1431 07/28/23 0403 07/29/23 0348 07/30/23 0424  NA 140 139 137 137  K 3.8 4.6 4.0 4.4  CL 103 105 101 99  CO2 23 24 25 28   GLUCOSE 110* 83 89 85  BUN 41* 41* 41* 44*  CREATININE 1.06* 1.13* 1.08* 0.96  CALCIUM 9.2 8.9 9.0 8.9  MG 2.0  --   --    --    GFR: Estimated Creatinine Clearance: 30.2 mL/min (by C-G formula based on SCr of 0.96 mg/dL). Liver Function Tests: Recent Labs  Lab 07/27/23 1431  AST 49*  ALT 32  ALKPHOS 87  BILITOT 1.6*  PROT 7.0  ALBUMIN 3.9   No results for input(s): "LIPASE", "AMYLASE" in the last 168 hours. No results for input(s): "AMMONIA" in the last 168 hours. Coagulation Profile: No results for input(s): "INR", "PROTIME" in the last 168 hours. Cardiac Enzymes: No results for input(s): "CKTOTAL", "CKMB", "CKMBINDEX", "TROPONINI" in the last 168 hours. BNP (last 3 results) No results for input(s): "PROBNP" in the last 8760 hours. HbA1C: No results for input(s): "HGBA1C" in the last 72 hours. CBG: No results for input(s): "GLUCAP" in the last 168 hours. Lipid Profile: No results for input(s): "CHOL", "HDL", "LDLCALC", "TRIG", "CHOLHDL", "LDLDIRECT" in the last 72 hours. Thyroid Function Tests: No results for input(s): "TSH", "T4TOTAL", "FREET4", "T3FREE", "THYROIDAB" in the last 72 hours. Anemia Panel:  No results for input(s): "VITAMINB12", "FOLATE", "FERRITIN", "TIBC", "IRON", "RETICCTPCT" in the last 72 hours. Sepsis Labs: No results for input(s): "PROCALCITON", "LATICACIDVEN" in the last 168 hours.  No results found for this or any previous visit (from the past 240 hours).       Radiology Studies: No results found.       Scheduled Meds:  enoxaparin (LOVENOX) injection  30 mg Subcutaneous Q24H   furosemide  40 mg Intravenous Daily   metoprolol succinate  50 mg Oral Daily   potassium chloride  40 mEq Oral BID   Continuous Infusions:   LOS: 3 days    Time spent: 53 minutes spent on chart review, discussion with nursing staff, consultants, updating family and interview/physical exam; more than 50% of that time was spent in counseling and/or coordination of care.    Alvira Philips [place], DO [facility] Available via Epic secure chat 7am-7pm After these hours, please  refer to coverage provider listed on [email or web address] 07/30/2023, 1:19 PM

## 2023-07-30 NOTE — Progress Notes (Signed)
 Mobility Specialist - Progress Note   07/30/23 1147  Mobility  Activity Transferred from bed to chair  Level of Assistance Minimal assist, patient does 75% or more  Assistive Device Front wheel walker  Distance Ambulated (ft) 3 ft  Range of Motion/Exercises Active  Activity Response Tolerated fair  Mobility Referral Yes  Mobility visit 1 Mobility  Mobility Specialist Start Time (ACUTE ONLY) 1130  Mobility Specialist Stop Time (ACUTE ONLY) 1147  Mobility Specialist Time Calculation (min) (ACUTE ONLY) 17 min   Pt was found in bed and agreeable to ambulate. Upon standing pt stated needing to urinate and assisted pt with pericare due to urine running down her leg. At EOS pt assisted to recliner chair due to fatigue and was left with all needs met. Call bell in reach.  Billey Chang Mobility Specialist

## 2023-07-31 DIAGNOSIS — I5033 Acute on chronic diastolic (congestive) heart failure: Secondary | ICD-10-CM | POA: Diagnosis not present

## 2023-07-31 DIAGNOSIS — I890 Lymphedema, not elsewhere classified: Secondary | ICD-10-CM | POA: Diagnosis not present

## 2023-07-31 LAB — BASIC METABOLIC PANEL
Anion gap: 8 (ref 5–15)
BUN: 47 mg/dL — ABNORMAL HIGH (ref 8–23)
CO2: 29 mmol/L (ref 22–32)
Calcium: 8.8 mg/dL — ABNORMAL LOW (ref 8.9–10.3)
Chloride: 101 mmol/L (ref 98–111)
Creatinine, Ser: 1.18 mg/dL — ABNORMAL HIGH (ref 0.44–1.00)
GFR, Estimated: 42 mL/min — ABNORMAL LOW (ref >60)
Glucose, Bld: 83 mg/dL (ref 70–99)
Potassium: 4.5 mmol/L (ref 3.5–5.1)
Sodium: 138 mmol/L (ref 135–145)

## 2023-07-31 MED ORDER — ENOXAPARIN SODIUM 30 MG/0.3ML IJ SOSY
30.0000 mg | PREFILLED_SYRINGE | INTRAMUSCULAR | Status: DC
  Administered 2023-07-31 – 2023-08-01 (×2): 30 mg via SUBCUTANEOUS
  Filled 2023-07-31 (×2): qty 0.3

## 2023-07-31 MED ORDER — FUROSEMIDE 40 MG PO TABS
40.0000 mg | ORAL_TABLET | Freq: Every day | ORAL | Status: DC
  Administered 2023-07-31 – 2023-08-01 (×2): 40 mg via ORAL
  Filled 2023-07-31 (×2): qty 1

## 2023-07-31 NOTE — Progress Notes (Signed)
 PROGRESS NOTE    Susan Pitts  ZOX:[id number] DOB: 10/30/1923 DOA: 07/27/2023 PCP: Garlan Fillers, MD    Brief Narrative:   Susan Pitts is a 89 y.o. female with past medical history significant for chronic diastolic congestive heart failure, essential hypertension, pulmonary hypertension, lower extremity lymphedema, anxiety who presented to [facility] ED from home via EMS on 07/27/2023 with worsening lower extremity edema, difficulty ambulating and weeping wounds from her legs.  Patient currently lives home alone, daughter lives close by.  Usually able to perform most ADLs on her own with some help by her daughter who visits her a few days a week.  She has been on Lasix for a long time but due to hassle of going to the bathroom frequently has been using less lately and as needed only.  Since, has noted progressively worsening bilateral lower extremity edema with weeping of her legs.  She has Unna boots but has difficulty putting them on and off.  Has had decreased mobility over the last few days.  Given the progression of her symptoms she sought further care in the ED.  In the ED, temperature 98.2 F, HR 75, RR 19, BP 163/68, SpO2 95% room air.  WBC 6.6, hemoglobin 14.4, platelet count 173.  Sodium 140, potassium 3.8, chloride 103, CO2 23, glucose 110, BUN 41, Cram 1.06.  AST 49, ALT 32, total bilirubin 1.6.  BNP 1017.3.  Chest x-ray with interval cardiomegaly and pulmonary edema with small bilateral pleural effusions. EKG with normal sinus rhythm at 76 bpm, no ST-T wave changes, QTc 409 ms.  Patient was given IV Lasix 40 mg x 1.  TRH consulted for admission for further evaluation management of acute on chronic CHF exacerbation.  Assessment & Plan:    Acute on chronic diastolic congestive heart failure exacerbation Patient presenting with Gress of shortness of breath, worsening lower extremity edema.  Home Lasix dose has been reduced over the last several months.  TTE with LVEF 55 to  60%, LV with no R WMA, moderate asymmetric LVH, left ventricular diastolic function cannot be evaluated, RV systolic function mildly reduced, LA severely dilated, moderate/severe TR, no aortic stenosis, IVC normal in size. -- net negative past 24h and net negative 6.7L since admission -- Transition IV furosemide to 40 mg PO q24h today -- Strict I's and O's and daily weights -- BMP daily  Essential hypertension Home regimen includes benazepril 40 mg p.o. twice daily, metoprolol succinate 50 mg p.o. daily -- Hold home benazepril for now -- Continue metoprolol succinate 50 mg p.o. daily -- Continue IV Lasix as above -- Continue to monitor BP closely  Bilateral lower extremity lymphedema/wounds Aquacel to left anterior and posterior leg wounds Q day, and cover with foam dressings.  Change foam dressings Q 3 days or PRN soiling.   Anxiety -- Diazepam 5 mg p.o. twice daily as needed for anxiety  Weakness/debility/deconditioning -- PT/OT evaluation: Recommend SNF placement -- TOC consulted for anticipated need of placement; possible home health patient declines  DVT prophylaxis: enoxaparin (LOVENOX) injection 30 mg Start: 07/31/23 1000    Code Status: Full Code Family Communication: No family present at bedside this morning  Disposition Plan:  Level of care: Telemetry Status is: Inpatient Remains inpatient appropriate because: IV diuresis    Consultants:  None  Procedures:  TTE  Antimicrobials:  None   Subjective: Patient seen examined bedside, lying in bed.  No family present.  Patient continues to tolerate IV diuresis, discussed  will transition to oral Lasix today.  No other specific complaints, concerns or questions at this time.  Awaiting discharge to SNF. Patient with no other specific questions, concerns or complaints this morning.  Patient denies headache, no chest pain, no palpitations, no shortness of breath, no abdominal pain, no fever/chills/night sweats, no  nausea/vomiting/diarrhea, no focal weakness, no fatigue, no paresthesias.  No acute events overnight per nursing staff.  Objective: Vitals:   07/31/23 0322 07/31/23 0500 07/31/23 0544 07/31/23 0853  BP: (!) 151/67  (!) 157/75 (!) 124/55  Pulse: 63  68 80  Resp: 18  15 18   Temp: (!) 97.4 F (36.3 C)  97.6 F (36.4 C)   TempSrc: Oral  Oral Axillary  SpO2: 97%  98% 96%  Weight:  59 kg    Height:        Intake/Output Summary (Last 24 hours) at 07/31/2023 1122 Last data filed at 07/31/2023 0853 Gross per 24 hour  Intake 150 ml  Output 1150 ml  Net -1000 ml   Filed Weights   07/29/23 0500 07/30/23 0500 07/31/23 0500  Weight: 64.8 kg 64.4 kg 59 kg    Examination:  Physical Exam: GEN: NAD, alert and oriented x 3, elderly in appearance HEENT: NCAT, PERRL, EOMI, sclera clear, MMM PULM: CTAB w/o wheezes/crackles, normal respiratory effort, on room air CV: RRR w/o M/G/R GI: abd soft, NTND, + BS MSK: Moves all extremities independently, significant lower extremity pitting edema 1+ up to the level of knee, erythematous changes bilateral lower extremities with weeping wounds to left lower extremity as depicted below NEURO: No focal neurological deficits PSYCH: normal mood/affect Integumentary: Lower extremity wounds as depicted below, otherwise no other concerning rashes/lesions/wounds noted on exposed skin surfaces          Data Reviewed: I have personally reviewed following labs and imaging studies  CBC: Recent Labs  Lab 07/27/23 1431 07/28/23 0403  WBC 6.9 5.4  NEUTROABS 5.4  --   HGB 14.4 13.9  HCT 42.4 43.4  MCV 100.5* 101.6*  PLT 173 136*   Basic Metabolic Panel: Recent Labs  Lab 07/27/23 1431 07/28/23 0403 07/29/23 0348 07/30/23 0424 07/31/23 0414  NA 140 139 137 137 138  K 3.8 4.6 4.0 4.4 4.5  CL 103 105 101 99 101  CO2 23 24 25 28 29   GLUCOSE 110* 83 89 85 83  BUN 41* 41* 41* 44* 47*  CREATININE 1.06* 1.13* 1.08* 0.96 1.18*  CALCIUM 9.2 8.9 9.0 8.9  8.8*  MG 2.0  --   --   --   --    GFR: Estimated Creatinine Clearance: 22.5 mL/min (A) (by C-G formula based on SCr of 1.18 mg/dL (H)). Liver Function Tests: Recent Labs  Lab 07/27/23 1431  AST 49*  ALT 32  ALKPHOS 87  BILITOT 1.6*  PROT 7.0  ALBUMIN 3.9   No results for input(s): "LIPASE", "AMYLASE" in the last 168 hours. No results for input(s): "AMMONIA" in the last 168 hours. Coagulation Profile: No results for input(s): "INR", "PROTIME" in the last 168 hours. Cardiac Enzymes: No results for input(s): "CKTOTAL", "CKMB", "CKMBINDEX", "TROPONINI" in the last 168 hours. BNP (last 3 results) No results for input(s): "PROBNP" in the last 8760 hours. HbA1C: No results for input(s): "HGBA1C" in the last 72 hours. CBG: No results for input(s): "GLUCAP" in the last 168 hours. Lipid Profile: No results for input(s): "CHOL", "HDL", "LDLCALC", "TRIG", "CHOLHDL", "LDLDIRECT" in the last 72 hours. Thyroid Function Tests: No results for input(s): "  TSH", "T4TOTAL", "FREET4", "T3FREE", "THYROIDAB" in the last 72 hours. Anemia Panel: No results for input(s): "VITAMINB12", "FOLATE", "FERRITIN", "TIBC", "IRON", "RETICCTPCT" in the last 72 hours. Sepsis Labs: No results for input(s): "PROCALCITON", "LATICACIDVEN" in the last 168 hours.  No results found for this or any previous visit (from the past 240 hours).       Radiology Studies: No results found.       Scheduled Meds:  enoxaparin (LOVENOX) injection  30 mg Subcutaneous Q24H   furosemide  40 mg Oral Daily   Gerhardt's butt cream   Topical BID   metoprolol succinate  50 mg Oral Daily   potassium chloride  40 mEq Oral BID   Continuous Infusions:   LOS: 4 days    Time spent: 53 minutes spent on chart review, discussion with nursing staff, consultants, updating family and interview/physical exam; more than 50% of that time was spent in counseling and/or coordination of care.    Alvira Philips [place], DO [facility] Available via Epic secure chat 7am-7pm After these hours, please refer to coverage provider listed on [email or web address] 07/31/2023, 11:22 AM

## 2023-07-31 NOTE — Progress Notes (Signed)
 Occupational Therapy Treatment Patient Details Name: Susan Pitts MRN: [id number] DOB: 25-Sep-1923 Today's Date: 07/31/2023   History of present illness Susan Pitts is a 89 y.o. female who was brought to the ED today by EMS from home for progressively worsening LE edema. PMH significant for HTN, mild systolic CHF, severe pulm artery hypertension, chronic b/l lower EXTR lymphedema.   OT comments  Patient was able to engage in toileting tasks at [facility] level with increased time and education on proper positioning in bed to reduce pressure on back and bottom. Patient would continue to benefit from skilled OT services at this time while admitted and after d/c to address noted deficits in order to improve overall safety and independence in ADLs. Patient will benefit from continued inpatient follow up therapy, <3 hours/day.        If plan is discharge home, recommend the following:  A lot of help with bathing/dressing/bathroom;A lot of help with walking and/or transfers;Assistance with cooking/housework;Assist for transportation   Equipment Recommendations  None recommended by OT       Precautions / Restrictions Precautions Precautions: Fall Restrictions Weight Bearing Restrictions Per Provider Order: No       Mobility Bed Mobility Overal bed mobility: Needs Assistance Bed Mobility: Sit to Supine, Supine to Sit     Supine to sit: Min assist, HOB elevated Sit to supine: Min assist   General bed mobility comments: assist to bring LEs on to bed, assist to scoot up in supine            Balance Overall balance assessment: Needs assistance Sitting-balance support: Feet supported, No upper extremity supported Sitting balance-Leahy Scale: Fair                                     ADL either performed or assessed with clinical judgement   ADL Overall ADL's : Needs assistance/impaired         Lower Body Dressing Details (indicate cue type and reason): reported using  sock aid at home. TD Toilet Transfer: Minimal assistance;Ambulation;Rolling walker (2 wheels) Toilet Transfer Details (indicate cue type and reason): to commode with RW adn BSC over toilet Toileting- Clothing Manipulation and Hygiene: Sit to/from stand;Contact guard assist         General ADL Comments: noted to have dressing on thoracic spine rolling up with nurse made aware via secure chat. nurse in room to fix at end of session. patient was educated on sidelying to reduce pressure on bottom and back. patient verbalized understanding.      Cognition Arousal: Alert Behavior During Therapy: WFL for tasks assessed/performed, Anxious               OT - Cognition Comments: patient was appropirate during session.                   Following commands impaired: Only follows one step commands consistently                    Pertinent Vitals/ Pain       Pain Assessment Pain Assessment: No/denies pain         Frequency  Min 1X/week        Progress Toward Goals  OT Goals(current goals can now be found in the care plan section)  Progress towards OT goals: Progressing toward goals     Plan  AM-PAC OT "6 Clicks" Daily Activity     Outcome Measure   Help from another person eating meals?: A Little Help from another person taking care of personal grooming?: A Little Help from another person toileting, which includes using toliet, bedpan, or urinal?: A Lot Help from another person bathing (including washing, rinsing, drying)?: A Lot Help from another person to put on and taking off regular upper body clothing?: A Little Help from another person to put on and taking off regular lower body clothing?: Total 6 Click Score: 14    End of Session Equipment Utilized During Treatment: Gait belt;Rolling walker (2 wheels)  OT Visit Diagnosis: Muscle weakness (generalized) (M62.81);Pain;Unsteadiness on feet (R26.81) Pain - Right/Left: Left Pain - part of body:  Leg   Activity Tolerance Patient limited by fatigue   Patient Left in bed;with call bell/phone within reach;with bed alarm set;with nursing/sitter in room   Nurse Communication Other (comment) (dressing needing to be changed and pure wic)        Time: 4098-1191 OT Time Calculation (min): 28 min  Charges: OT General Charges $OT Visit: 1 Visit OT Treatments $Self Care/Home Management : 23-37 mins  Rosalio Loud, MS Acute Rehabilitation Department Office# [phone]   Selinda Flavin 07/31/2023, 4:19 PM

## 2023-07-31 NOTE — Progress Notes (Signed)
 Mobility Specialist - Progress Note   07/31/23 1021  Mobility  Activity Transferred to/from [facility]  Level of Assistance Minimal assist, patient does 75% or more  Assistive Device Front wheel walker  Range of Motion/Exercises Active Assistive  Activity Response Tolerated well  Mobility Referral Yes  Mobility visit 1 Mobility  Mobility Specialist Start Time (ACUTE ONLY) 1000  Mobility Specialist Stop Time (ACUTE ONLY) 1021  Mobility Specialist Time Calculation (min) (ACUTE ONLY) 21 min   Pt was found in bed and agreeable to ambulate. Upon sitting EOB stated needing to urinate and transferred to [facility] Fall. NT to room and assisted with pericare. Pt stated feeling weak and deferred ambulation. At EOS was left on recliner chair with NT in room.  Billey Chang Mobility Specialist

## 2023-08-01 DIAGNOSIS — R062 Wheezing: Secondary | ICD-10-CM | POA: Diagnosis not present

## 2023-08-01 DIAGNOSIS — S22029D Unspecified fracture of second thoracic vertebra, subsequent encounter for fracture with routine healing: Secondary | ICD-10-CM | POA: Diagnosis not present

## 2023-08-01 DIAGNOSIS — L89103 Pressure ulcer of unspecified part of back, stage 3: Secondary | ICD-10-CM | POA: Diagnosis not present

## 2023-08-01 DIAGNOSIS — Z7401 Bed confinement status: Secondary | ICD-10-CM | POA: Diagnosis not present

## 2023-08-01 DIAGNOSIS — S81802A Unspecified open wound, left lower leg, initial encounter: Secondary | ICD-10-CM | POA: Diagnosis not present

## 2023-08-01 DIAGNOSIS — I5033 Acute on chronic diastolic (congestive) heart failure: Secondary | ICD-10-CM | POA: Diagnosis not present

## 2023-08-01 DIAGNOSIS — M6259 Muscle wasting and atrophy, not elsewhere classified, multiple sites: Secondary | ICD-10-CM | POA: Diagnosis not present

## 2023-08-01 DIAGNOSIS — R531 Weakness: Secondary | ICD-10-CM | POA: Diagnosis not present

## 2023-08-01 DIAGNOSIS — J101 Influenza due to other identified influenza virus with other respiratory manifestations: Secondary | ICD-10-CM | POA: Diagnosis not present

## 2023-08-01 DIAGNOSIS — F419 Anxiety disorder, unspecified: Secondary | ICD-10-CM | POA: Diagnosis not present

## 2023-08-01 DIAGNOSIS — I10 Essential (primary) hypertension: Secondary | ICD-10-CM | POA: Diagnosis not present

## 2023-08-01 DIAGNOSIS — M6281 Muscle weakness (generalized): Secondary | ICD-10-CM | POA: Diagnosis not present

## 2023-08-01 DIAGNOSIS — F411 Generalized anxiety disorder: Secondary | ICD-10-CM | POA: Diagnosis not present

## 2023-08-01 DIAGNOSIS — Z741 Need for assistance with personal care: Secondary | ICD-10-CM | POA: Diagnosis not present

## 2023-08-01 DIAGNOSIS — R0981 Nasal congestion: Secondary | ICD-10-CM | POA: Diagnosis not present

## 2023-08-01 DIAGNOSIS — N183 Chronic kidney disease, stage 3 unspecified: Secondary | ICD-10-CM | POA: Diagnosis not present

## 2023-08-01 DIAGNOSIS — G3184 Mild cognitive impairment, so stated: Secondary | ICD-10-CM | POA: Diagnosis not present

## 2023-08-01 DIAGNOSIS — R059 Cough, unspecified: Secondary | ICD-10-CM | POA: Diagnosis not present

## 2023-08-01 DIAGNOSIS — R2689 Other abnormalities of gait and mobility: Secondary | ICD-10-CM | POA: Diagnosis not present

## 2023-08-01 DIAGNOSIS — I890 Lymphedema, not elsewhere classified: Secondary | ICD-10-CM | POA: Diagnosis not present

## 2023-08-01 DIAGNOSIS — I739 Peripheral vascular disease, unspecified: Secondary | ICD-10-CM | POA: Diagnosis not present

## 2023-08-01 DIAGNOSIS — I272 Pulmonary hypertension, unspecified: Secondary | ICD-10-CM | POA: Diagnosis not present

## 2023-08-01 LAB — BASIC METABOLIC PANEL
Anion gap: 9 (ref 5–15)
BUN: 47 mg/dL — ABNORMAL HIGH (ref 8–23)
CO2: 26 mmol/L (ref 22–32)
Calcium: 8.5 mg/dL — ABNORMAL LOW (ref 8.9–10.3)
Chloride: 100 mmol/L (ref 98–111)
Creatinine, Ser: 1.11 mg/dL — ABNORMAL HIGH (ref 0.44–1.00)
GFR, Estimated: 45 mL/min — ABNORMAL LOW (ref >60)
Glucose, Bld: 81 mg/dL (ref 70–99)
Potassium: 5.2 mmol/L — ABNORMAL HIGH (ref 3.5–5.1)
Sodium: 135 mmol/L (ref 135–145)

## 2023-08-01 MED ORDER — FUROSEMIDE 40 MG PO TABS: 40.0000 mg | ORAL_TABLET | Freq: Every day | ORAL | 0 refills | Status: DC

## 2023-08-01 MED ORDER — HYDROCODONE-ACETAMINOPHEN 5-325 MG PO TABS
1.0000 | ORAL_TABLET | Freq: Four times a day (QID) | ORAL | 0 refills | Status: DC | PRN
Start: 2023-08-01 — End: 2023-08-31

## 2023-08-01 MED ORDER — POLYETHYLENE GLYCOL 3350 17 G PO PACK: 17.0000 g | PACK | Freq: Every day | ORAL | Status: DC | PRN

## 2023-08-01 MED ORDER — DIAZEPAM 5 MG PO TABS: 5.0000 mg | ORAL_TABLET | Freq: Two times a day (BID) | ORAL | 0 refills | Status: DC | PRN

## 2023-08-01 NOTE — Care Management Important Message (Signed)
 Important Message  Patient Details IM Letter given Name: Susan Pitts MRN: [id number] Date of Birth: Jun 01, 1923   Important Message Given:  Yes - Medicare IM     Caren Macadam 08/01/2023, 11:49 AM

## 2023-08-01 NOTE — Progress Notes (Signed)
 Discussed discharge instructions with pt. Placed AVS in discharge packet to be sent with pt to [facility]. Dimitris Shanahan, Yancey Flemings, RN

## 2023-08-01 NOTE — TOC Transition Note (Signed)
 Transition of Care [facility]) - Discharge Note  Patient Details  Name: Susan Pitts MRN: [id number] Date of Birth: 1923-07-29  Transition of Care [facility]) CM/SW Contact:  Ewing Schlein, LCSW Phone Number: 08/01/2023, 10:27 AM  Clinical Narrative: Patient has been approved for SNF at [facility]. Plan auth ID # is: [id number]. Patient has been approved for 07/29/2023-08/02/2023. Patient will go to room 1206 and the number is [phone]. Discharge summary, discharge orders, and SNF transfer report faxed to facility in hub. Medical necessity form done; PTAR scheduled. Discharge packet completed. CSW updated patient and daughter regarding insurance approval and transportation. RN updated. TOC signing off.  Final next level of care: Skilled Nursing Facility Barriers to Discharge: Barriers Resolved  Patient Goals and CMS Choice Patient states their goals for this hospitalization and ongoing recovery are:: Go to rehab CMS Medicare.gov Compare Post Acute Care list provided to:: Patient Choice offered to / list presented to : Patient, Adult Children  Discharge Placement Existing PASRR number confirmed : 07/29/23          Patient chooses bed at: [facility] Patient to be transferred to facility by: PTAR Name of family member notified: Malachy Moan (daughter) Patient and family notified of of transfer: 08/01/23  Discharge Plan and Services Additional resources added to the After Visit Summary for   In-house Referral: Clinical Social Work        DME Arranged: N/A DME Agency: NA  Social Drivers of Health (SDOH) Interventions SDOH Screenings   Food Insecurity: No Food Insecurity (07/27/2023)  Housing: Low Risk  (07/27/2023)  Transportation Needs: Unmet Transportation Needs (07/27/2023)  Utilities: Not At Risk (07/27/2023)  Social Connections: Moderately Integrated (07/27/2023)  Tobacco Use: Low Risk  (07/27/2023)   Readmission Risk Interventions     No data to display

## 2023-08-01 NOTE — Progress Notes (Signed)
Report called to [facility]. Susan Pitts, Laurel Dimmer, RN

## 2023-08-01 NOTE — Progress Notes (Signed)
 Physical Therapy Treatment Patient Details Name: KAMIA INSALACO MRN: [id number] DOB: 26-Apr-1924 Today's Date: 08/01/2023   History of Present Illness Takiya M Murry is a 89 y.o. female who was brought to the ED today by EMS from home for progressively worsening LE edema. PMH significant for HTN, mild systolic CHF, severe pulm artery hypertension, chronic b/l lower EXTR lymphedema.    PT Comments   Pt admitted with above diagnosis.  Pt currently with functional limitations due to the deficits listed below (see PT Problem List). Pt in bed when PT arrived. Pt agreeable to participation. Pt required min A for bed mobility with use of hospital bed, CGA for sit to stand and SPT with use of RW for bed, recliner and commode transfers. Gait assessed in personal room to and from the bathroom with RW and CGA with min cues. Pt reports she is leaving today for SNF. Pt reports he son passed 2/28 and is having a hard time processing because she was in the hospital and could not see him. Pt left seated in recliner, all needs in place and nursing staff addressing LE wounds. Pt will benefit from acute skilled PT to increase their independence and safety with mobility to allow discharge.      If plan is discharge home, recommend the following: A little help with walking and/or transfers;A little help with bathing/dressing/bathroom;Help with stairs or ramp for entrance;Assist for transportation;Assistance with cooking/housework   Can travel by private vehicle     Yes  Equipment Recommendations  None recommended by PT    Recommendations for Other Services       Precautions / Restrictions Precautions Precautions: Fall Restrictions Weight Bearing Restrictions Per Provider Order: No     Mobility  Bed Mobility Overal bed mobility: Needs Assistance Bed Mobility: Supine to Sit     Supine to sit: Min assist, HOB elevated Sit to supine: Min assist   General bed mobility comments: A for trunk to sit EOb     Transfers Overall transfer level: Needs assistance Equipment used: Rolling walker (2 wheels) Transfers: Sit to/from Stand Sit to Stand: Contact guard assist           General transfer comment: pt reports lift chair in home setting, however able to perform sit to stand from EOB, recliner and elevated commode seat with min cues, RW and CGA    Ambulation/Gait Ambulation/Gait assistance: Contact guard assist Gait Distance (Feet): 15 Feet Assistive device: Rolling walker (2 wheels) Gait Pattern/deviations: Step-through pattern, Trunk flexed Gait velocity: decreased     General Gait Details: min cues for safety and proper posture, pt trunk flexion compunded by kyphosis   Stairs             Wheelchair Mobility     Tilt Bed    Modified Rankin (Stroke Patients Only)       Balance Overall balance assessment: Needs assistance Sitting-balance support: Feet supported, No upper extremity supported Sitting balance-Leahy Scale: Fair     Standing balance support: During functional activity, Reliant on assistive device for balance Standing balance-Leahy Scale: Poor                              Communication Communication Communication: No apparent difficulties  Cognition Arousal: Alert Behavior During Therapy: WFL for tasks assessed/performed, Anxious   PT - Cognitive impairments: No apparent impairments  PT - Cognition Comments: talkative occasional redirection to task; Following commands: Impaired Following commands impaired: Follows multi-step commands inconsistently    Cueing    Exercises      General Comments General comments (skin integrity, edema, etc.): wounds: L distal anterior LE, R medial distal LE anterior to medial malleolus, R trunk under breast, and 2 on back      Pertinent Vitals/Pain Pain Assessment Pain Assessment: No/denies pain    Home Living                          Prior  Function            PT Goals (current goals can now be found in the care plan section) Acute Rehab PT Goals PT Goal Formulation: With patient Time For Goal Achievement: 08/11/23 Potential to Achieve Goals: Good Progress towards PT goals: Progressing toward goals    Frequency    Min 1X/week      PT Plan      Co-evaluation              AM-PAC PT "6 Clicks" Mobility   Outcome Measure  Help needed turning from your back to your side while in a flat bed without using bedrails?: A Little Help needed moving from lying on your back to sitting on the side of a flat bed without using bedrails?: A Little Help needed moving to and from a bed to a chair (including a wheelchair)?: A Little Help needed standing up from a chair using your arms (e.g., wheelchair or bedside chair)?: A Little Help needed to walk in hospital room?: A Little Help needed climbing 3-5 steps with a railing? : A Lot 6 Click Score: 17    End of Session Equipment Utilized During Treatment: Gait belt Activity Tolerance: Patient tolerated treatment well Patient left: in chair;with call bell/phone within reach;with nursing/sitter in room Nurse Communication: Mobility status PT Visit Diagnosis: Other abnormalities of gait and mobility (R26.89)     Time: 1610-9604 PT Time Calculation (min) (ACUTE ONLY): 34 min  Charges:    $Gait Training: 8-22 mins $Therapeutic Activity: 8-22 mins PT General Charges $$ ACUTE PT VISIT: 1 Visit                     Johnny Bridge, PT Acute Rehab    Jacqualyn Posey 08/01/2023, 1:07 PM

## 2023-08-01 NOTE — Discharge Summary (Signed)
 Physician Discharge Summary  Susan Pitts ZOX:[id number] DOB: May 15, 1924 DOA: 07/27/2023  PCP: Garlan Fillers, MD  Admit date: 07/27/2023 Discharge date: 08/01/2023  Admitted From: Home Disposition: [facility]  Recommendations for Outpatient Follow-up:  Follow up with PCP in 1-2 weeks Discontinued benazepril to allow up titration of furosemide to 40 mg p.o. daily Continue to monitor lower extremity edema Please obtain BMP in one week to ensure renal function remained stable on increased dose of furosemide   Discharge Condition: Stable CODE STATUS: Full code Diet recommendation: Heart healthy, 2 g sodium restricted diet  History of present illness:  Susan Pitts is a 89 y.o. female with past medical history significant for chronic diastolic congestive heart failure, essential hypertension, pulmonary hypertension, lower extremity lymphedema, anxiety who presented to [facility] ED from home via EMS on 07/27/2023 with worsening lower extremity edema, difficulty ambulating and weeping wounds from her legs.  Patient currently lives home alone, daughter lives close by.  Usually able to perform most ADLs on her own with some help by her daughter who visits her a few days a week.  She has been on Lasix for a long time but due to hassle of going to the bathroom frequently has been using less lately and as needed only.  Since, has noted progressively worsening bilateral lower extremity edema with weeping of her legs.  She has Unna boots but has difficulty putting them on and off.  Has had decreased mobility over the last few days.  Given the progression of her symptoms she sought further care in the ED.   In the ED, temperature 98.2 F, HR 75, RR 19, BP 163/68, SpO2 95% room air.  WBC 6.6, hemoglobin 14.4, platelet count 173.  Sodium 140, potassium 3.8, chloride 103, CO2 23, glucose 110, BUN 41, Cram 1.06.  AST 49, ALT 32, total bilirubin 1.6.  BNP 1017.3.  Chest x-ray with  interval cardiomegaly and pulmonary edema with small bilateral pleural effusions. EKG with normal sinus rhythm at 76 bpm, no ST-T wave changes, QTc 409 ms.  Patient was given IV Lasix 40 mg x 1.  TRH consulted for admission for further evaluation management of acute on chronic CHF exacerbation.  Hospital course:  Acute on chronic diastolic congestive heart failure exacerbation Patient presenting with progressive of shortness of breath, worsening lower extremity edema.  Home Lasix dose has been reduced over the last several months.  TTE with LVEF 55 to 60%, LV with no R WMA, moderate asymmetric LVH, left ventricular diastolic function cannot be evaluated, RV systolic function mildly reduced, LA severely dilated, moderate/severe TR, no aortic stenosis, IVC normal in size.  Patient was started on IV diuresis with furosemide 40 mg IV daily, now transition to 40 mg p.o. daily with marked improvement of her lower extremity edema.  Continue to monitor her lower extremity edema, recommend repeat BMP 1 week to ensure renal function remained stable.  Continue sodium restricted diet.  Outpatient follow-up with PCP.  Weights at time of discharge 59.2 kg.   Essential hypertension Home regimen includes benazepril 40 mg p.o. twice daily, metoprolol succinate 50 mg p.o. daily.  Home benazepril was discontinued to allow up titration of furosemide.  Continue metoprolol succinate 50 mg p.o. daily, furosemide 40 mg p.o. daily.  Continue to monitor BP outpatient.   Bilateral lower extremity lymphedema/wounds Aquacel to left anterior and posterior leg wounds Q day, and cover with foam dressings.  Change foam dressings Q 3 days  or PRN soiling.    Anxiety Diazepam 5 mg p.o. twice daily as needed for anxiety  Pressure injury, POA Pressure Injury 07/30/23 Vertebral column Mid Stage 2 -  Partial thickness loss of dermis presenting as a shallow open injury with a red, pink wound bed without slough. S2 Spine (Active)  07/30/23  1802  Location: Vertebral column  Location Orientation: Mid  Staging: Stage 2 -  Partial thickness loss of dermis presenting as a shallow open injury with a red, pink wound bed without slough.  Wound Description (Comments): S2 Spine  Present on Admission:   -- Continue wound care, offloading   Weakness/debility/deconditioning Discharging to SNF for further rehabilitation.  Patient lives alone at baseline, and is very independent.    Discharge Diagnoses:  Principal Problem:   Acute CHF (congestive heart failure) [facility])    Discharge Instructions  Discharge Instructions     Call MD for:  difficulty breathing, headache or visual disturbances   Complete by: As directed    Call MD for:  extreme fatigue   Complete by: As directed    Call MD for:  persistant dizziness or light-headedness   Complete by: As directed    Call MD for:  persistant nausea and vomiting   Complete by: As directed    Call MD for:  severe uncontrolled pain   Complete by: As directed    Call MD for:  temperature >100.4   Complete by: As directed    Diet - low sodium heart healthy   Complete by: As directed    Discharge wound care:   Complete by: As directed    Apply Aquacel to left anterior and posterior leg wounds Q day, and cover with foam dressings.  Change foam dressings Q 3 days or PRN soiling.   Increase activity slowly   Complete by: As directed       Allergies as of 08/01/2023       Reactions   Other Anaphylaxis, Swelling   Lima beans   Alendronate Sodium Other (See Comments)   Cardura [doxazosin Mesylate] Other (See Comments)   UNK reaction   Cardura [doxazosin]    Other reaction(s): Unknown   Carvedilol Other (See Comments)   Codeine Other (See Comments)   UNK reaction   Fosamax [alendronate]    Other reaction(s): gastritis   Inderal [propranolol] Other (See Comments)   UNK reaction   Penicillin G Sodium Other (See Comments)   Shrimp (diagnostic) Nausea And Vomiting   Penicillins  Rash   Penicillins Hives, Rash        Medication List     STOP taking these medications    benazepril 40 MG tablet Commonly known as: LOTENSIN       TAKE these medications    acetaminophen 325 MG tablet Commonly known as: TYLENOL Take 2 tablets (650 mg total) by mouth every 6 (six) hours as needed for mild pain (or Fever >/= 101).   aspirin EC 81 MG tablet Take 81 mg by mouth daily.   CALTRATE 600+D PO Take 1 tablet by mouth daily.   ciclopirox 0.77 % cream Commonly known as: LOPROX Apply 1 Application topically 2 (two) times daily.   denosumab 60 MG/ML Sosy injection Commonly known as: PROLIA Inject 60 mg into the skin every 6 (six) months. Scheduled for 08/02/2023   diazepam 5 MG tablet Commonly known as: VALIUM Take 1 tablet (5 mg total) by mouth 2 (two) times daily as needed for anxiety.   furosemide 40 MG  tablet Commonly known as: LASIX Take 1 tablet (40 mg total) by mouth daily. What changed:  medication strength how much to take   HYDROcodone-acetaminophen 5-325 MG tablet Commonly known as: NORCO/VICODIN Take 1 tablet by mouth 4 (four) times daily as needed for moderate pain (pain score 4-6). What changed: reasons to take this   loratadine 10 MG tablet Commonly known as: CLARITIN Take 1 tablet (10 mg total) by mouth daily as needed for allergies.   metoprolol succinate 50 MG 24 hr tablet Commonly known as: TOPROL-XL Take 50 mg by mouth daily.   multivitamin capsule Take 1 capsule by mouth daily.   mupirocin ointment 2 % Commonly known as: BACTROBAN Apply 1 Application topically 2 (two) times daily.   nystatin powder Commonly known as: MYCOSTATIN/NYSTOP Apply 1 Application topically 2 (two) times daily.   polyethylene glycol 17 g packet Commonly known as: MIRALAX / GLYCOLAX Take 17 g by mouth daily as needed for mild constipation.   Vitamin B-12 2500 MCG Subl Place 2,500 mcg under the tongue daily.   Vitamin D3 50 MCG (2000 UT)  capsule Take 2,000 Units by mouth daily.               Discharge Care Instructions  (From admission, onward)           Start     Ordered   08/01/23 0000  Discharge wound care:       Comments: Apply Aquacel to left anterior and posterior leg wounds Q day, and cover with foam dressings.  Change foam dressings Q 3 days or PRN soiling.   08/01/23 0942            Contact information for follow-up providers     [facility]. Call.   Why: Call to be screened for transportation assistance. Contact information: [address] [place], [place] [address] [phone]        Garlan Fillers, MD. Schedule an appointment as soon as possible for a visit in 1 week(s).   Specialty: Internal Medicine Contact information: [address] [place] [address] [phone]              Contact information for after-discharge care     Destination     [facility] Preferred SNF .   Service: Skilled Nursing Contact information: [address] [place] [address] [phone]                    Allergies  Allergen Reactions   Other Anaphylaxis and Swelling    Lima beans   Alendronate Sodium Other (See Comments)   Cardura [Doxazosin Mesylate] Other (See Comments)    UNK reaction   Cardura [Doxazosin]     Other reaction(s): Unknown   Carvedilol Other (See Comments)   Codeine Other (See Comments)    UNK reaction   Fosamax [Alendronate]     Other reaction(s): gastritis   Inderal [Propranolol] Other (See Comments)    UNK reaction   Penicillin G Sodium Other (See Comments)   Shrimp (Diagnostic) Nausea And Vomiting   Penicillins Rash   Penicillins Hives and Rash    Consultations: None   Procedures/Studies: ECHOCARDIOGRAM COMPLETE Result Date: 07/28/2023    ECHOCARDIOGRAM REPORT   Patient Name:   Susan Pitts Date of Exam: 07/28/2023 Medical Rec #:   [id number]    Height:       63.0 in Accession #:    [id number]  Weight:       142.0 lb Date of Birth:  [birth year]    BSA:          1.672 m Patient Age:    89 years     BP:           164/84 mmHg Patient Gender: F            HR:           68 bpm. Exam Location:  Inpatient Procedure: 2D Echo, Cardiac Doppler and Color Doppler (Both Spectral and Color            Flow Doppler were utilized during procedure). Indications:    CHF  History:        Patient has prior history of Echocardiogram examinations, most                 recent 12/01/2020. CHF, PAD; Risk Factors:Hypertension.  Sonographer:    Amy Chionchio Referring Phys: [id number] BINAYA DAHAL IMPRESSIONS  1. Left ventricular ejection fraction, by estimation, is 55 to 60%. The left ventricle has normal function. The left ventricle has no regional wall motion abnormalities. There is moderate asymmetric left ventricular hypertrophy of the septal segment. Left ventricular diastolic function could not be evaluated.  2. Right ventricular systolic function is mildly reduced. The right ventricular size is normal.  3. Left atrial size was severely dilated.  4. The mitral valve is normal in structure. Moderate mitral valve regurgitation. No evidence of mitral stenosis. The mean mitral valve gradient is 2.0 mmHg.  5. Tricuspid valve regurgitation is moderate to severe.  6. The aortic valve was not well visualized. There is mild calcification of the aortic valve. There is mild thickening of the aortic valve. Aortic valve regurgitation is mild to moderate. No aortic stenosis is present.  7. The inferior vena cava is normal in size with greater than 50% respiratory variability, suggesting right atrial pressure of 3 mmHg. Comparison(s): Prior images reviewed side by side. Valve regurgitation has increased. FINDINGS  Left Ventricle: Left ventricular ejection fraction, by estimation, is 55 to 60%. The left ventricle has normal function. The left ventricle has no regional wall motion  abnormalities. Strain imaging was not performed. The left ventricular internal cavity  size was normal in size. There is moderate asymmetric left ventricular hypertrophy of the septal segment. Left ventricular diastolic function could not be evaluated due to mitral regurgitation (moderate or greater). Left ventricular diastolic function could not be evaluated. Right Ventricle: The right ventricular size is normal. No increase in right ventricular wall thickness. Right ventricular systolic function is mildly reduced. Left Atrium: Left atrial size was severely dilated. Right Atrium: Right atrial size was normal in size. Pericardium: There is no evidence of pericardial effusion. Mitral Valve: The mitral valve is normal in structure. Moderate mitral valve regurgitation. No evidence of mitral valve stenosis. MV peak gradient, 5.5 mmHg. The mean mitral valve gradient is 2.0 mmHg with average heart rate of 69 bpm. Tricuspid Valve: The tricuspid valve is normal in structure. Tricuspid valve regurgitation is moderate to severe. No evidence of tricuspid stenosis. Aortic Valve: The aortic valve was not well visualized. There is mild calcification of the aortic valve. There is mild thickening of the aortic valve. Aortic valve regurgitation is mild to moderate. Aortic regurgitation PHT measures 556 msec. No aortic stenosis is present. Aortic valve mean gradient measures 9.0 mmHg. Aortic valve peak gradient measures 14.5 mmHg. Aortic valve area, by VTI measures 0.92 cm. Pulmonic Valve: The  pulmonic valve was normal in structure. Pulmonic valve regurgitation is not visualized. No evidence of pulmonic stenosis. Aorta: The aortic root and ascending aorta are structurally normal, with no evidence of dilitation. Venous: The inferior vena cava is normal in size with greater than 50% respiratory variability, suggesting right atrial pressure of 3 mmHg. IAS/Shunts: The atrial septum is grossly normal. Additional Comments: 3D imaging was  not performed.  LEFT VENTRICLE PLAX 2D LVIDd:         4.60 cm     Diastology LVIDs:         3.70 cm     LV e' medial:    3.81 cm/s LV PW:         1.00 cm     LV E/e' medial:  32.3 LV IVS:        1.40 cm     LV e' lateral:   7.40 cm/s LVOT diam:     2.00 cm     LV E/e' lateral: 16.6 LV SV:         40 LV SV Index:   24 LVOT Area:     3.14 cm  LV Volumes (MOD) LV vol d, MOD A2C: 84.5 ml LV vol d, MOD A4C: 91.7 ml LV vol s, MOD A2C: 38.2 ml LV vol s, MOD A4C: 31.3 ml LV SV MOD A2C:     46.3 ml LV SV MOD A4C:     91.7 ml LV SV MOD BP:      52.5 ml RIGHT VENTRICLE          IVC RV Basal diam:  3.50 cm  IVC diam: 2.20 cm RV Mid diam:    2.90 cm TAPSE (M-mode): 1.0 cm LEFT ATRIUM             Index        RIGHT ATRIUM           Index LA Vol (A2C):   95.6 ml 57.19 ml/m  RA Area:     15.70 cm LA Vol (A4C):   95.1 ml 56.89 ml/m  RA Volume:   40.10 ml  23.99 ml/m LA Biplane Vol: 96.1 ml 57.49 ml/m  AORTIC VALVE                     PULMONIC VALVE AV Area (Vmax):    1.09 cm      PV Vmax:          0.72 m/s AV Area (Vmean):   0.98 cm      PV Peak grad:     2.1 mmHg AV Area (VTI):     0.92 cm      PR End Diast Vel: 5.66 msec AV Vmax:           190.50 cm/s AV Vmean:          139.000 cm/s AV VTI:            0.438 m AV Peak Grad:      14.5 mmHg AV Mean Grad:      9.0 mmHg LVOT Vmax:         66.25 cm/s LVOT Vmean:        43.300 cm/s LVOT VTI:          0.128 m LVOT/AV VTI ratio: 0.29 AI PHT:            556 msec  AORTA Ao Root diam: 3.50 cm Ao Asc diam:  3.30 cm MITRAL VALVE  TRICUSPID VALVE MV Area (PHT): 4.83 cm       TR Peak grad:   35.3 mmHg MV Area VTI:   1.47 cm       TR Vmax:        297.00 cm/s MV Peak grad:  5.5 mmHg MV Mean grad:  2.0 mmHg       SHUNTS MV Vmax:       1.17 m/s       Systemic VTI:  0.13 m MV Vmean:      68.5 cm/s      Systemic Diam: 2.00 cm MV Decel Time: 157 msec MR Peak grad:    101.6 mmHg MR Mean grad:    70.0 mmHg MR Vmax:         504.00 cm/s MR Vmean:        401.0 cm/s MR PISA:          1.57 cm MR PISA Eff ROA: 12 mm MR PISA Radius:  0.50 cm MV E velocity: 123.00 cm/s MV A velocity: 42.70 cm/s MV E/A ratio:  2.88 Riley Lam MD Electronically signed by Riley Lam MD Signature Date/Time: 07/28/2023/12:32:34 PM    Final    DG Chest Port 1 View Result Date: 07/27/2023 CLINICAL DATA:  Fatigue, weeping edema of the lower extremities. EXAM: PORTABLE CHEST 1 VIEW COMPARISON:  12/24/2021 FINDINGS: Interval enlarged cardiac silhouette. The aorta remains tortuous and partially calcified. Interval increased prominence of the pulmonary vasculature and interstitial markings, including Kerley lines. Interval small bilateral pleural effusions. Lower thoracic spine degenerative changes. Diffuse osteopenia. IMPRESSION: Interval cardiomegaly and pulmonary edema with small bilateral pleural effusions. Electronically Signed   By: Beckie Salts M.D.   On: 07/27/2023 15:35     Subjective: Patient seen and examined at bedside, lying in bed.  RN present.  In good spirits this morning.  No specific complaints, concerns or questions at this time.  Discharging to rehab today.  Appreciative all the care she has received in the hospital.  Lower extremity edema markedly improved since admission.  Denies headache, no dizziness, no chest pain, no palpitations, no shortness of breath, no abdominal pain, no fever/chills/night sweats, no nausea/vomiting/diarrhea, no focal weakness, no fatigue, no paresthesias.  No acute events overnight per nursing staff.  Discharge Exam: Vitals:   07/31/23 2057 08/01/23 0438  BP: 137/61 (!) 124/50  Pulse: 66 62  Resp: 16   Temp: 98 F (36.7 C) 97.7 F (36.5 C)  SpO2: 97% 96%   Vitals:   07/31/23 1350 07/31/23 2057 08/01/23 0438 08/01/23 0500  BP: 124/73 137/61 (!) 124/50   Pulse: 82 66 62   Resp: 16 16    Temp: 97.7 F (36.5 C) 98 F (36.7 C) 97.7 F (36.5 C)   TempSrc: Oral  Axillary   SpO2: 97% 97% 96%   Weight:    59.2 kg  Height:         Physical Exam: GEN: NAD, alert and oriented x 3, elderly in appearance HEENT: NCAT, PERRL, EOMI, sclera clear, MMM PULM: CTAB w/o wheezes/crackles, normal respiratory effort, on room air CV: RRR w/o M/G/R GI: abd soft, NTND, NABS, no R/G/M MSK: Trace bilateral lower extremity peripheral edema, muscle strength globally intact 5/5 bilateral upper/lower extremities NEURO: CN II-XII intact, no focal deficits, sensation to light touch intact PSYCH: normal mood/affect Integumentary: Lower extremity erythema much improved with improvement of edema, dry/intact, no rashes or wounds    The results of significant diagnostics from this hospitalization (including imaging, microbiology,  ancillary and laboratory) are listed below for reference.     Microbiology: No results found for this or any previous visit (from the past 240 hours).   Labs: BNP (last 3 results) Recent Labs    07/27/23 1431 07/29/23 0348  BNP 1,017.3* 874.0*   Basic Metabolic Panel: Recent Labs  Lab 07/27/23 1431 07/28/23 0403 07/29/23 0348 07/30/23 0424 07/31/23 0414 08/01/23 0414  NA 140 139 137 137 138 135  K 3.8 4.6 4.0 4.4 4.5 5.2*  CL 103 105 101 99 101 100  CO2 23 24 25 28 29 26   GLUCOSE 110* 83 89 85 83 81  BUN 41* 41* 41* 44* 47* 47*  CREATININE 1.06* 1.13* 1.08* 0.96 1.18* 1.11*  CALCIUM 9.2 8.9 9.0 8.9 8.8* 8.5*  MG 2.0  --   --   --   --   --    Liver Function Tests: Recent Labs  Lab 07/27/23 1431  AST 49*  ALT 32  ALKPHOS 87  BILITOT 1.6*  PROT 7.0  ALBUMIN 3.9   No results for input(s): "LIPASE", "AMYLASE" in the last 168 hours. No results for input(s): "AMMONIA" in the last 168 hours. CBC: Recent Labs  Lab 07/27/23 1431 07/28/23 0403  WBC 6.9 5.4  NEUTROABS 5.4  --   HGB 14.4 13.9  HCT 42.4 43.4  MCV 100.5* 101.6*  PLT 173 136*   Cardiac Enzymes: No results for input(s): "CKTOTAL", "CKMB", "CKMBINDEX", "TROPONINI" in the last 168 hours. BNP: Invalid input(s):  "POCBNP" CBG: No results for input(s): "GLUCAP" in the last 168 hours. D-Dimer No results for input(s): "DDIMER" in the last 72 hours. Hgb A1c No results for input(s): "HGBA1C" in the last 72 hours. Lipid Profile No results for input(s): "CHOL", "HDL", "LDLCALC", "TRIG", "CHOLHDL", "LDLDIRECT" in the last 72 hours. Thyroid function studies No results for input(s): "TSH", "T4TOTAL", "T3FREE", "THYROIDAB" in the last 72 hours.  Invalid input(s): "FREET3" Anemia work up No results for input(s): "VITAMINB12", "FOLATE", "FERRITIN", "TIBC", "IRON", "RETICCTPCT" in the last 72 hours. Urinalysis    Component Value Date/Time   COLORURINE COLORLESS (A) 11/30/2020 1218   APPEARANCEUR CLEAR 11/30/2020 1218   LABSPEC 1.009 11/30/2020 1218   PHURINE 7.0 11/30/2020 1218   GLUCOSEU NEGATIVE 11/30/2020 1218   HGBUR NEGATIVE 11/30/2020 1218   BILIRUBINUR NEGATIVE 11/30/2020 1218   KETONESUR NEGATIVE 11/30/2020 1218   PROTEINUR NEGATIVE 11/30/2020 1218   NITRITE NEGATIVE 11/30/2020 1218   LEUKOCYTESUR NEGATIVE 11/30/2020 1218   Sepsis Labs Recent Labs  Lab 07/27/23 1431 07/28/23 0403  WBC 6.9 5.4   Microbiology No results found for this or any previous visit (from the past 240 hours).   Time coordinating discharge: Over 30 minutes  SIGNED:   Alvira Philips [place], DO  [facility] 08/01/2023, 9:42 AM

## 2023-08-02 ENCOUNTER — Encounter (HOSPITAL_COMMUNITY): Payer: Medicare PPO

## 2023-08-02 DIAGNOSIS — M6281 Muscle weakness (generalized): Secondary | ICD-10-CM | POA: Diagnosis not present

## 2023-08-02 DIAGNOSIS — I272 Pulmonary hypertension, unspecified: Secondary | ICD-10-CM | POA: Diagnosis not present

## 2023-08-02 DIAGNOSIS — Z741 Need for assistance with personal care: Secondary | ICD-10-CM | POA: Diagnosis not present

## 2023-08-02 DIAGNOSIS — S81802A Unspecified open wound, left lower leg, initial encounter: Secondary | ICD-10-CM | POA: Diagnosis not present

## 2023-08-02 DIAGNOSIS — L89103 Pressure ulcer of unspecified part of back, stage 3: Secondary | ICD-10-CM | POA: Diagnosis not present

## 2023-08-02 DIAGNOSIS — I890 Lymphedema, not elsewhere classified: Secondary | ICD-10-CM | POA: Diagnosis not present

## 2023-08-02 DIAGNOSIS — F419 Anxiety disorder, unspecified: Secondary | ICD-10-CM | POA: Diagnosis not present

## 2023-08-02 DIAGNOSIS — I5033 Acute on chronic diastolic (congestive) heart failure: Secondary | ICD-10-CM | POA: Diagnosis not present

## 2023-08-02 DIAGNOSIS — I10 Essential (primary) hypertension: Secondary | ICD-10-CM | POA: Diagnosis not present

## 2023-08-02 DIAGNOSIS — R2689 Other abnormalities of gait and mobility: Secondary | ICD-10-CM | POA: Diagnosis not present

## 2023-08-04 DIAGNOSIS — I5033 Acute on chronic diastolic (congestive) heart failure: Secondary | ICD-10-CM | POA: Diagnosis not present

## 2023-08-04 DIAGNOSIS — I890 Lymphedema, not elsewhere classified: Secondary | ICD-10-CM | POA: Diagnosis not present

## 2023-08-04 DIAGNOSIS — I10 Essential (primary) hypertension: Secondary | ICD-10-CM | POA: Diagnosis not present

## 2023-08-04 DIAGNOSIS — I272 Pulmonary hypertension, unspecified: Secondary | ICD-10-CM | POA: Diagnosis not present

## 2023-08-04 DIAGNOSIS — F419 Anxiety disorder, unspecified: Secondary | ICD-10-CM | POA: Diagnosis not present

## 2023-08-05 DIAGNOSIS — R2689 Other abnormalities of gait and mobility: Secondary | ICD-10-CM | POA: Diagnosis not present

## 2023-08-05 DIAGNOSIS — Z741 Need for assistance with personal care: Secondary | ICD-10-CM | POA: Diagnosis not present

## 2023-08-05 DIAGNOSIS — I5033 Acute on chronic diastolic (congestive) heart failure: Secondary | ICD-10-CM | POA: Diagnosis not present

## 2023-08-05 DIAGNOSIS — F419 Anxiety disorder, unspecified: Secondary | ICD-10-CM | POA: Diagnosis not present

## 2023-08-05 DIAGNOSIS — M6281 Muscle weakness (generalized): Secondary | ICD-10-CM | POA: Diagnosis not present

## 2023-08-08 DIAGNOSIS — I5033 Acute on chronic diastolic (congestive) heart failure: Secondary | ICD-10-CM | POA: Diagnosis not present

## 2023-08-08 DIAGNOSIS — F419 Anxiety disorder, unspecified: Secondary | ICD-10-CM | POA: Diagnosis not present

## 2023-08-08 DIAGNOSIS — I890 Lymphedema, not elsewhere classified: Secondary | ICD-10-CM | POA: Diagnosis not present

## 2023-08-08 DIAGNOSIS — I10 Essential (primary) hypertension: Secondary | ICD-10-CM | POA: Diagnosis not present

## 2023-08-08 DIAGNOSIS — I272 Pulmonary hypertension, unspecified: Secondary | ICD-10-CM | POA: Diagnosis not present

## 2023-08-09 DIAGNOSIS — Z741 Need for assistance with personal care: Secondary | ICD-10-CM | POA: Diagnosis not present

## 2023-08-09 DIAGNOSIS — I890 Lymphedema, not elsewhere classified: Secondary | ICD-10-CM | POA: Diagnosis not present

## 2023-08-09 DIAGNOSIS — I10 Essential (primary) hypertension: Secondary | ICD-10-CM | POA: Diagnosis not present

## 2023-08-09 DIAGNOSIS — R2689 Other abnormalities of gait and mobility: Secondary | ICD-10-CM | POA: Diagnosis not present

## 2023-08-09 DIAGNOSIS — I5033 Acute on chronic diastolic (congestive) heart failure: Secondary | ICD-10-CM | POA: Diagnosis not present

## 2023-08-09 DIAGNOSIS — I272 Pulmonary hypertension, unspecified: Secondary | ICD-10-CM | POA: Diagnosis not present

## 2023-08-09 DIAGNOSIS — S81802A Unspecified open wound, left lower leg, initial encounter: Secondary | ICD-10-CM | POA: Diagnosis not present

## 2023-08-09 DIAGNOSIS — F419 Anxiety disorder, unspecified: Secondary | ICD-10-CM | POA: Diagnosis not present

## 2023-08-09 DIAGNOSIS — M6281 Muscle weakness (generalized): Secondary | ICD-10-CM | POA: Diagnosis not present

## 2023-08-09 DIAGNOSIS — L89103 Pressure ulcer of unspecified part of back, stage 3: Secondary | ICD-10-CM | POA: Diagnosis not present

## 2023-08-10 DIAGNOSIS — I272 Pulmonary hypertension, unspecified: Secondary | ICD-10-CM | POA: Diagnosis not present

## 2023-08-10 DIAGNOSIS — F419 Anxiety disorder, unspecified: Secondary | ICD-10-CM | POA: Diagnosis not present

## 2023-08-10 DIAGNOSIS — I10 Essential (primary) hypertension: Secondary | ICD-10-CM | POA: Diagnosis not present

## 2023-08-10 DIAGNOSIS — I5033 Acute on chronic diastolic (congestive) heart failure: Secondary | ICD-10-CM | POA: Diagnosis not present

## 2023-08-10 DIAGNOSIS — I890 Lymphedema, not elsewhere classified: Secondary | ICD-10-CM | POA: Diagnosis not present

## 2023-08-12 DIAGNOSIS — M6281 Muscle weakness (generalized): Secondary | ICD-10-CM | POA: Diagnosis not present

## 2023-08-12 DIAGNOSIS — I272 Pulmonary hypertension, unspecified: Secondary | ICD-10-CM | POA: Diagnosis not present

## 2023-08-12 DIAGNOSIS — I5033 Acute on chronic diastolic (congestive) heart failure: Secondary | ICD-10-CM | POA: Diagnosis not present

## 2023-08-12 DIAGNOSIS — I10 Essential (primary) hypertension: Secondary | ICD-10-CM | POA: Diagnosis not present

## 2023-08-12 DIAGNOSIS — F419 Anxiety disorder, unspecified: Secondary | ICD-10-CM | POA: Diagnosis not present

## 2023-08-12 DIAGNOSIS — I890 Lymphedema, not elsewhere classified: Secondary | ICD-10-CM | POA: Diagnosis not present

## 2023-08-12 DIAGNOSIS — Z741 Need for assistance with personal care: Secondary | ICD-10-CM | POA: Diagnosis not present

## 2023-08-12 DIAGNOSIS — R2689 Other abnormalities of gait and mobility: Secondary | ICD-10-CM | POA: Diagnosis not present

## 2023-08-16 DIAGNOSIS — F419 Anxiety disorder, unspecified: Secondary | ICD-10-CM | POA: Diagnosis not present

## 2023-08-16 DIAGNOSIS — I272 Pulmonary hypertension, unspecified: Secondary | ICD-10-CM | POA: Diagnosis not present

## 2023-08-16 DIAGNOSIS — I10 Essential (primary) hypertension: Secondary | ICD-10-CM | POA: Diagnosis not present

## 2023-08-16 DIAGNOSIS — L89103 Pressure ulcer of unspecified part of back, stage 3: Secondary | ICD-10-CM | POA: Diagnosis not present

## 2023-08-16 DIAGNOSIS — I890 Lymphedema, not elsewhere classified: Secondary | ICD-10-CM | POA: Diagnosis not present

## 2023-08-16 DIAGNOSIS — I5033 Acute on chronic diastolic (congestive) heart failure: Secondary | ICD-10-CM | POA: Diagnosis not present

## 2023-08-17 DIAGNOSIS — R059 Cough, unspecified: Secondary | ICD-10-CM | POA: Diagnosis not present

## 2023-08-17 DIAGNOSIS — R062 Wheezing: Secondary | ICD-10-CM | POA: Diagnosis not present

## 2023-08-17 DIAGNOSIS — J101 Influenza due to other identified influenza virus with other respiratory manifestations: Secondary | ICD-10-CM | POA: Diagnosis not present

## 2023-08-17 DIAGNOSIS — R0981 Nasal congestion: Secondary | ICD-10-CM | POA: Diagnosis not present

## 2023-08-19 DIAGNOSIS — M6281 Muscle weakness (generalized): Secondary | ICD-10-CM | POA: Diagnosis not present

## 2023-08-19 DIAGNOSIS — I5033 Acute on chronic diastolic (congestive) heart failure: Secondary | ICD-10-CM | POA: Diagnosis not present

## 2023-08-19 DIAGNOSIS — F419 Anxiety disorder, unspecified: Secondary | ICD-10-CM | POA: Diagnosis not present

## 2023-08-19 DIAGNOSIS — R2689 Other abnormalities of gait and mobility: Secondary | ICD-10-CM | POA: Diagnosis not present

## 2023-08-19 DIAGNOSIS — Z741 Need for assistance with personal care: Secondary | ICD-10-CM | POA: Diagnosis not present

## 2023-08-23 DIAGNOSIS — R2689 Other abnormalities of gait and mobility: Secondary | ICD-10-CM | POA: Diagnosis not present

## 2023-08-23 DIAGNOSIS — Z741 Need for assistance with personal care: Secondary | ICD-10-CM | POA: Diagnosis not present

## 2023-08-23 DIAGNOSIS — J101 Influenza due to other identified influenza virus with other respiratory manifestations: Secondary | ICD-10-CM | POA: Diagnosis not present

## 2023-08-23 DIAGNOSIS — F419 Anxiety disorder, unspecified: Secondary | ICD-10-CM | POA: Diagnosis not present

## 2023-08-23 DIAGNOSIS — I5033 Acute on chronic diastolic (congestive) heart failure: Secondary | ICD-10-CM | POA: Diagnosis not present

## 2023-08-23 DIAGNOSIS — I10 Essential (primary) hypertension: Secondary | ICD-10-CM | POA: Diagnosis not present

## 2023-08-23 DIAGNOSIS — M6281 Muscle weakness (generalized): Secondary | ICD-10-CM | POA: Diagnosis not present

## 2023-08-23 DIAGNOSIS — L89103 Pressure ulcer of unspecified part of back, stage 3: Secondary | ICD-10-CM | POA: Diagnosis not present

## 2023-08-23 DIAGNOSIS — I272 Pulmonary hypertension, unspecified: Secondary | ICD-10-CM | POA: Diagnosis not present

## 2023-08-23 DIAGNOSIS — I890 Lymphedema, not elsewhere classified: Secondary | ICD-10-CM | POA: Diagnosis not present

## 2023-08-30 ENCOUNTER — Emergency Department (HOSPITAL_COMMUNITY)

## 2023-08-30 ENCOUNTER — Inpatient Hospital Stay (HOSPITAL_COMMUNITY)

## 2023-08-30 ENCOUNTER — Encounter (HOSPITAL_COMMUNITY): Payer: Self-pay

## 2023-08-30 ENCOUNTER — Other Ambulatory Visit: Payer: Self-pay

## 2023-08-30 ENCOUNTER — Inpatient Hospital Stay (HOSPITAL_COMMUNITY)
Admission: EM | Admit: 2023-08-30 | Discharge: 2023-09-29 | DRG: 064 | Disposition: E | Source: Skilled Nursing Facility | Attending: Pulmonary Disease | Admitting: Pulmonary Disease

## 2023-08-30 DIAGNOSIS — I639 Cerebral infarction, unspecified: Secondary | ICD-10-CM | POA: Diagnosis not present

## 2023-08-30 DIAGNOSIS — Z452 Encounter for adjustment and management of vascular access device: Secondary | ICD-10-CM | POA: Diagnosis not present

## 2023-08-30 DIAGNOSIS — Z7982 Long term (current) use of aspirin: Secondary | ICD-10-CM

## 2023-08-30 DIAGNOSIS — R29726 NIHSS score 26: Secondary | ICD-10-CM

## 2023-08-30 DIAGNOSIS — G8194 Hemiplegia, unspecified affecting left nondominant side: Secondary | ICD-10-CM | POA: Diagnosis present

## 2023-08-30 DIAGNOSIS — R6521 Severe sepsis with septic shock: Secondary | ICD-10-CM

## 2023-08-30 DIAGNOSIS — I959 Hypotension, unspecified: Secondary | ICD-10-CM | POA: Diagnosis not present

## 2023-08-30 DIAGNOSIS — Z880 Allergy status to penicillin: Secondary | ICD-10-CM

## 2023-08-30 DIAGNOSIS — I63411 Cerebral infarction due to embolism of right middle cerebral artery: Secondary | ICD-10-CM

## 2023-08-30 DIAGNOSIS — L89102 Pressure ulcer of unspecified part of back, stage 2: Secondary | ICD-10-CM | POA: Diagnosis not present

## 2023-08-30 DIAGNOSIS — E785 Hyperlipidemia, unspecified: Secondary | ICD-10-CM | POA: Diagnosis present

## 2023-08-30 DIAGNOSIS — J90 Pleural effusion, not elsewhere classified: Secondary | ICD-10-CM | POA: Diagnosis not present

## 2023-08-30 DIAGNOSIS — I5032 Chronic diastolic (congestive) heart failure: Secondary | ICD-10-CM | POA: Diagnosis present

## 2023-08-30 DIAGNOSIS — J690 Pneumonitis due to inhalation of food and vomit: Secondary | ICD-10-CM | POA: Diagnosis present

## 2023-08-30 DIAGNOSIS — Z66 Do not resuscitate: Secondary | ICD-10-CM | POA: Diagnosis present

## 2023-08-30 DIAGNOSIS — I4891 Unspecified atrial fibrillation: Secondary | ICD-10-CM | POA: Diagnosis not present

## 2023-08-30 DIAGNOSIS — J9601 Acute respiratory failure with hypoxia: Secondary | ICD-10-CM | POA: Diagnosis present

## 2023-08-30 DIAGNOSIS — Z515 Encounter for palliative care: Secondary | ICD-10-CM

## 2023-08-30 DIAGNOSIS — Z96659 Presence of unspecified artificial knee joint: Secondary | ICD-10-CM | POA: Diagnosis present

## 2023-08-30 DIAGNOSIS — I890 Lymphedema, not elsewhere classified: Secondary | ICD-10-CM | POA: Diagnosis present

## 2023-08-30 DIAGNOSIS — Z91013 Allergy to seafood: Secondary | ICD-10-CM

## 2023-08-30 DIAGNOSIS — I739 Peripheral vascular disease, unspecified: Secondary | ICD-10-CM | POA: Diagnosis present

## 2023-08-30 DIAGNOSIS — R0603 Acute respiratory distress: Secondary | ICD-10-CM

## 2023-08-30 DIAGNOSIS — I2510 Atherosclerotic heart disease of native coronary artery without angina pectoris: Secondary | ICD-10-CM | POA: Diagnosis not present

## 2023-08-30 DIAGNOSIS — J189 Pneumonia, unspecified organism: Secondary | ICD-10-CM | POA: Diagnosis not present

## 2023-08-30 DIAGNOSIS — J9691 Respiratory failure, unspecified with hypoxia: Secondary | ICD-10-CM

## 2023-08-30 DIAGNOSIS — A419 Sepsis, unspecified organism: Secondary | ICD-10-CM | POA: Diagnosis not present

## 2023-08-30 DIAGNOSIS — I272 Pulmonary hypertension, unspecified: Secondary | ICD-10-CM | POA: Diagnosis present

## 2023-08-30 DIAGNOSIS — I6523 Occlusion and stenosis of bilateral carotid arteries: Secondary | ICD-10-CM | POA: Diagnosis not present

## 2023-08-30 DIAGNOSIS — K8689 Other specified diseases of pancreas: Secondary | ICD-10-CM | POA: Diagnosis not present

## 2023-08-30 DIAGNOSIS — F419 Anxiety disorder, unspecified: Secondary | ICD-10-CM | POA: Diagnosis present

## 2023-08-30 DIAGNOSIS — I700 Atherosclerosis of aorta: Secondary | ICD-10-CM | POA: Diagnosis not present

## 2023-08-30 DIAGNOSIS — R55 Syncope and collapse: Secondary | ICD-10-CM | POA: Diagnosis not present

## 2023-08-30 DIAGNOSIS — I63511 Cerebral infarction due to unspecified occlusion or stenosis of right middle cerebral artery: Principal | ICD-10-CM | POA: Diagnosis present

## 2023-08-30 DIAGNOSIS — K8020 Calculus of gallbladder without cholecystitis without obstruction: Secondary | ICD-10-CM | POA: Diagnosis not present

## 2023-08-30 DIAGNOSIS — R404 Transient alteration of awareness: Secondary | ICD-10-CM | POA: Diagnosis not present

## 2023-08-30 DIAGNOSIS — I499 Cardiac arrhythmia, unspecified: Secondary | ICD-10-CM | POA: Diagnosis not present

## 2023-08-30 DIAGNOSIS — I110 Hypertensive heart disease with heart failure: Secondary | ICD-10-CM | POA: Diagnosis present

## 2023-08-30 DIAGNOSIS — Z91018 Allergy to other foods: Secondary | ICD-10-CM | POA: Diagnosis not present

## 2023-08-30 DIAGNOSIS — Z888 Allergy status to other drugs, medicaments and biological substances status: Secondary | ICD-10-CM

## 2023-08-30 DIAGNOSIS — R791 Abnormal coagulation profile: Secondary | ICD-10-CM | POA: Diagnosis present

## 2023-08-30 DIAGNOSIS — R29730 NIHSS score 30: Secondary | ICD-10-CM | POA: Diagnosis present

## 2023-08-30 DIAGNOSIS — R4182 Altered mental status, unspecified: Secondary | ICD-10-CM | POA: Diagnosis not present

## 2023-08-30 DIAGNOSIS — R579 Shock, unspecified: Secondary | ICD-10-CM | POA: Diagnosis present

## 2023-08-30 DIAGNOSIS — R0902 Hypoxemia: Secondary | ICD-10-CM | POA: Diagnosis not present

## 2023-08-30 DIAGNOSIS — R930 Abnormal findings on diagnostic imaging of skull and head, not elsewhere classified: Secondary | ICD-10-CM | POA: Diagnosis not present

## 2023-08-30 DIAGNOSIS — Z8249 Family history of ischemic heart disease and other diseases of the circulatory system: Secondary | ICD-10-CM

## 2023-08-30 DIAGNOSIS — R918 Other nonspecific abnormal finding of lung field: Secondary | ICD-10-CM | POA: Diagnosis not present

## 2023-08-30 DIAGNOSIS — Z4682 Encounter for fitting and adjustment of non-vascular catheter: Secondary | ICD-10-CM | POA: Diagnosis not present

## 2023-08-30 DIAGNOSIS — Z79899 Other long term (current) drug therapy: Secondary | ICD-10-CM

## 2023-08-30 LAB — STREP PNEUMONIAE URINARY ANTIGEN: Strep Pneumo Urinary Antigen: POSITIVE — AB

## 2023-08-30 LAB — COMPREHENSIVE METABOLIC PANEL WITH GFR
ALT: 41 U/L (ref 0–44)
AST: 79 U/L — ABNORMAL HIGH (ref 15–41)
Albumin: 1.9 g/dL — ABNORMAL LOW (ref 3.5–5.0)
Alkaline Phosphatase: 248 U/L — ABNORMAL HIGH (ref 38–126)
Anion gap: 20 — ABNORMAL HIGH (ref 5–15)
BUN: 77 mg/dL — ABNORMAL HIGH (ref 8–23)
CO2: 25 mmol/L (ref 22–32)
Calcium: 9.1 mg/dL (ref 8.9–10.3)
Chloride: 91 mmol/L — ABNORMAL LOW (ref 98–111)
Creatinine, Ser: 1.88 mg/dL — ABNORMAL HIGH (ref 0.44–1.00)
GFR, Estimated: 24 mL/min — ABNORMAL LOW (ref >60)
Glucose, Bld: 92 mg/dL (ref 70–99)
Potassium: 4.2 mmol/L (ref 3.5–5.1)
Sodium: 136 mmol/L (ref 135–145)
Total Bilirubin: 1.7 mg/dL — ABNORMAL HIGH (ref 0.0–1.2)
Total Protein: 5.6 g/dL — ABNORMAL LOW (ref 6.5–8.1)

## 2023-08-30 LAB — URINALYSIS, W/ REFLEX TO CULTURE (INFECTION SUSPECTED)
Bilirubin Urine: NEGATIVE
Glucose, UA: NEGATIVE mg/dL
Ketones, ur: NEGATIVE mg/dL
Leukocytes,Ua: NEGATIVE
Nitrite: NEGATIVE
Protein, ur: 30 mg/dL — AB
Specific Gravity, Urine: 1.029 (ref 1.005–1.030)
pH: 5 (ref 5.0–8.0)

## 2023-08-30 LAB — BLOOD CULTURE ID PANEL (REFLEXED) - BCID2

## 2023-08-30 LAB — I-STAT ARTERIAL BLOOD GAS, ED
Acid-base deficit: 8 mmol/L — ABNORMAL HIGH (ref 0.0–2.0)
Bicarbonate: 20.1 mmol/L (ref 20.0–28.0)
Calcium, Ion: 1.13 mmol/L — ABNORMAL LOW (ref 1.15–1.40)
HCT: 46 % (ref 36.0–46.0)
Hemoglobin: 15.6 g/dL — ABNORMAL HIGH (ref 12.0–15.0)
O2 Saturation: 92 %
Patient temperature: 98.6
Potassium: 3.9 mmol/L (ref 3.5–5.1)
Sodium: 132 mmol/L — ABNORMAL LOW (ref 135–145)
TCO2: 22 mmol/L (ref 22–32)
pCO2 arterial: 48.8 mmHg — ABNORMAL HIGH (ref 32–48)
pH, Arterial: 7.224 — ABNORMAL LOW (ref 7.35–7.45)
pO2, Arterial: 77 mmHg — ABNORMAL LOW (ref 83–108)

## 2023-08-30 LAB — I-STAT CHEM 8, ED
BUN: 75 mg/dL — ABNORMAL HIGH (ref 8–23)
Calcium, Ion: 1.05 mmol/L — ABNORMAL LOW (ref 1.15–1.40)
Chloride: 94 mmol/L — ABNORMAL LOW (ref 98–111)
Creatinine, Ser: 2.1 mg/dL — ABNORMAL HIGH (ref 0.44–1.00)
Glucose, Bld: 85 mg/dL (ref 70–99)
HCT: 47 % — ABNORMAL HIGH (ref 36.0–46.0)
Hemoglobin: 16 g/dL — ABNORMAL HIGH (ref 12.0–15.0)
Potassium: 4.3 mmol/L (ref 3.5–5.1)
Sodium: 134 mmol/L — ABNORMAL LOW (ref 135–145)
TCO2: 26 mmol/L (ref 22–32)

## 2023-08-30 LAB — ETHANOL: Alcohol, Ethyl (B): <10 mg/dL (ref <10)

## 2023-08-30 LAB — CBC
HCT: 47.1 % — ABNORMAL HIGH (ref 36.0–46.0)
Hemoglobin: 15.6 g/dL — ABNORMAL HIGH (ref 12.0–15.0)
MCH: 32.7 pg (ref 26.0–34.0)
MCHC: 33.1 g/dL (ref 30.0–36.0)
MCV: 98.7 fL (ref 80.0–100.0)
Platelets: 360 10*3/uL (ref 150–400)
RBC: 4.77 MIL/uL (ref 3.87–5.11)
RDW: 14.5 % (ref 11.5–15.5)
WBC: 13.2 10*3/uL — ABNORMAL HIGH (ref 4.0–10.5)
nRBC: 0.2 % (ref 0.0–0.2)

## 2023-08-30 LAB — PROTIME-INR
INR: 1.7 — ABNORMAL HIGH (ref 0.8–1.2)
Prothrombin Time: 19.7 s — ABNORMAL HIGH (ref 11.4–15.2)

## 2023-08-30 LAB — RAPID URINE DRUG SCREEN, HOSP PERFORMED
Amphetamines: NOT DETECTED
Barbiturates: NOT DETECTED
Benzodiazepines: NOT DETECTED
Cocaine: NOT DETECTED
Opiates: POSITIVE — AB
Tetrahydrocannabinol: NOT DETECTED

## 2023-08-30 LAB — DIFFERENTIAL
Abs Immature Granulocytes: 0.12 10*3/uL — ABNORMAL HIGH (ref 0.00–0.07)
Basophils Absolute: 0 10*3/uL (ref 0.0–0.1)
Basophils Relative: 0 %
Eosinophils Absolute: 0 10*3/uL (ref 0.0–0.5)
Eosinophils Relative: 0 %
Immature Granulocytes: 1 %
Lymphocytes Relative: 5 %
Lymphs Abs: 0.6 10*3/uL — ABNORMAL LOW (ref 0.7–4.0)
Monocytes Absolute: 0.2 10*3/uL (ref 0.1–1.0)
Monocytes Relative: 1 %
Neutro Abs: 12.2 10*3/uL — ABNORMAL HIGH (ref 1.7–7.7)
Neutrophils Relative %: 93 %
Smear Review: NORMAL

## 2023-08-30 LAB — GLUCOSE, CAPILLARY
Glucose-Capillary: 109 mg/dL — ABNORMAL HIGH (ref 70–99)
Glucose-Capillary: 134 mg/dL — ABNORMAL HIGH (ref 70–99)

## 2023-08-30 LAB — HEMOGLOBIN A1C
Hgb A1c MFr Bld: 5.5 % (ref 4.8–5.6)
Mean Plasma Glucose: 111.15 mg/dL

## 2023-08-30 LAB — APTT: aPTT: 33 s (ref 24–36)

## 2023-08-30 MED ORDER — SODIUM CHLORIDE 0.9 % IV SOLN
2.0000 g | INTRAVENOUS | Status: DC
  Administered 2023-08-30: 2 g via INTRAVENOUS
  Filled 2023-08-30: qty 20

## 2023-08-30 MED ORDER — MIDAZOLAM HCL 2 MG/2ML IJ SOLN
INTRAMUSCULAR | Status: AC
  Filled 2023-08-30: qty 2

## 2023-08-30 MED ORDER — NOREPINEPHRINE 4 MG/250ML-% IV SOLN
0.0000 ug/min | INTRAVENOUS | Status: DC
  Administered 2023-08-30 (×2): 35 ug/min via INTRAVENOUS
  Filled 2023-08-30: qty 250

## 2023-08-30 MED ORDER — HYDROCORTISONE SOD SUC (PF) 100 MG IJ SOLR
100.0000 mg | Freq: Two times a day (BID) | INTRAMUSCULAR | Status: DC
Start: 2023-08-30 — End: 2023-08-30
  Administered 2023-08-30: 100 mg via INTRAVENOUS
  Filled 2023-08-30: qty 2

## 2023-08-30 MED ORDER — ACETAMINOPHEN 650 MG RE SUPP: 650.0000 mg | Freq: Four times a day (QID) | RECTAL | Status: DC | PRN

## 2023-08-30 MED ORDER — PROPOFOL 1000 MG/100ML IV EMUL: 0.0000 ug/kg/min | INTRAVENOUS | Status: DC

## 2023-08-30 MED ORDER — LACTATED RINGERS IV BOLUS
1000.0000 mL | Freq: Once | INTRAVENOUS | Status: AC
  Administered 2023-08-30: 1000 mL via INTRAVENOUS

## 2023-08-30 MED ORDER — ETOMIDATE 2 MG/ML IV SOLN
INTRAVENOUS | Status: AC | PRN
  Administered 2023-08-30: 20 mg via INTRAVENOUS

## 2023-08-30 MED ORDER — FAMOTIDINE 20 MG PO TABS
20.0000 mg | ORAL_TABLET | Freq: Two times a day (BID) | ORAL | Status: DC
  Administered 2023-08-30: 20 mg
  Filled 2023-08-30 (×2): qty 1

## 2023-08-30 MED ORDER — MIDAZOLAM HCL 2 MG/2ML IJ SOLN
1.0000 mg | INTRAMUSCULAR | Status: DC | PRN
  Administered 2023-08-30: 2 mg via INTRAVENOUS
  Filled 2023-08-30: qty 2

## 2023-08-30 MED ORDER — FENTANYL 2500MCG IN NS 250ML (10MCG/ML) PREMIX INFUSION
25.0000 ug/h | INTRAVENOUS | Status: DC
  Administered 2023-08-30: 25 ug/h via INTRAVENOUS
  Filled 2023-08-30: qty 250

## 2023-08-30 MED ORDER — NOREPINEPHRINE 16 MG/250ML-% IV SOLN
0.0000 ug/min | INTRAVENOUS | Status: DC
  Administered 2023-08-30: 40 ug/min via INTRAVENOUS
  Filled 2023-08-30: qty 250

## 2023-08-30 MED ORDER — HALOPERIDOL LACTATE 5 MG/ML IJ SOLN
2.5000 mg | INTRAMUSCULAR | Status: DC | PRN
  Filled 2023-08-30: qty 1

## 2023-08-30 MED ORDER — DOCUSATE SODIUM 50 MG/5ML PO LIQD: 100.0000 mg | Freq: Two times a day (BID) | ORAL | Status: DC | PRN

## 2023-08-30 MED ORDER — AMIODARONE HCL IN DEXTROSE 360-4.14 MG/200ML-% IV SOLN
30.0000 mg/h | INTRAVENOUS | Status: DC
  Administered 2023-08-30: 30 mg/h via INTRAVENOUS

## 2023-08-30 MED ORDER — IOHEXOL 350 MG/ML SOLN
75.0000 mL | Freq: Once | INTRAVENOUS | Status: AC | PRN
  Administered 2023-08-30: 75 mL via INTRAVENOUS

## 2023-08-30 MED ORDER — DOCUSATE SODIUM 50 MG/5ML PO LIQD
100.0000 mg | Freq: Two times a day (BID) | ORAL | Status: DC
  Administered 2023-08-30: 100 mg
  Filled 2023-08-30 (×2): qty 10

## 2023-08-30 MED ORDER — FENTANYL BOLUS VIA INFUSION: 100.0000 ug | INTRAVENOUS | Status: DC | PRN

## 2023-08-30 MED ORDER — POLYETHYLENE GLYCOL 3350 17 G PO PACK
17.0000 g | PACK | Freq: Every day | ORAL | Status: DC
  Filled 2023-08-30: qty 1

## 2023-08-30 MED ORDER — EPINEPHRINE HCL 5 MG/250ML IV SOLN IN NS
0.5000 ug/min | INTRAVENOUS | Status: DC
  Administered 2023-08-30: 0.5 ug/min via INTRAVENOUS
  Filled 2023-08-30: qty 250

## 2023-08-30 MED ORDER — GLYCOPYRROLATE 0.2 MG/ML IJ SOLN
0.2000 mg | INTRAMUSCULAR | Status: DC | PRN
  Administered 2023-08-30: 0.2 mg via INTRAVENOUS
  Filled 2023-08-30: qty 1

## 2023-08-30 MED ORDER — SODIUM CHLORIDE 0.9 % IV SOLN: 250.0000 mL | INTRAVENOUS | Status: DC

## 2023-08-30 MED ORDER — INSULIN ASPART 100 UNIT/ML IJ SOLN
0.0000 [IU] | INTRAMUSCULAR | Status: DC
  Administered 2023-08-30: 1 [IU] via SUBCUTANEOUS

## 2023-08-30 MED ORDER — AMIODARONE HCL IN DEXTROSE 360-4.14 MG/200ML-% IV SOLN
60.0000 mg/h | INTRAVENOUS | Status: AC
Start: 2023-08-30 — End: 2023-08-30
  Administered 2023-08-30 (×2): 60 mg/h via INTRAVENOUS
  Filled 2023-08-30 (×2): qty 200

## 2023-08-30 MED ORDER — AMIODARONE LOAD VIA INFUSION
150.0000 mg | Freq: Once | INTRAVENOUS | Status: AC
  Administered 2023-08-30: 150 mg via INTRAVENOUS
  Filled 2023-08-30: qty 83.34

## 2023-08-30 MED ORDER — NOREPINEPHRINE 4 MG/250ML-% IV SOLN
INTRAVENOUS | Status: AC
  Filled 2023-08-30: qty 250

## 2023-08-30 MED ORDER — SODIUM CHLORIDE 0.9 % IV SOLN: INTRAVENOUS | Status: DC

## 2023-08-30 MED ORDER — GLYCOPYRROLATE 0.2 MG/ML IJ SOLN: 0.2000 mg | INTRAMUSCULAR | Status: DC | PRN

## 2023-08-30 MED ORDER — FENTANYL CITRATE PF 50 MCG/ML IJ SOSY
PREFILLED_SYRINGE | INTRAMUSCULAR | Status: AC
  Filled 2023-08-30: qty 2

## 2023-08-30 MED ORDER — FENTANYL BOLUS VIA INFUSION
25.0000 ug | INTRAVENOUS | Status: DC | PRN
  Administered 2023-08-30 (×4): 25 ug via INTRAVENOUS

## 2023-08-30 MED ORDER — NOREPINEPHRINE 4 MG/250ML-% IV SOLN
2.0000 ug/min | INTRAVENOUS | Status: DC
  Administered 2023-08-30: 10 ug/min via INTRAVENOUS

## 2023-08-30 MED ORDER — FAMOTIDINE 20 MG PO TABS: 10.0000 mg | ORAL_TABLET | Freq: Every day | ORAL | Status: DC

## 2023-08-30 MED ORDER — GLYCOPYRROLATE 1 MG PO TABS: 1.0000 mg | ORAL_TABLET | ORAL | Status: DC | PRN

## 2023-08-30 MED ORDER — SUCCINYLCHOLINE CHLORIDE 20 MG/ML IJ SOLN
INTRAMUSCULAR | Status: AC | PRN
  Administered 2023-08-30: 100 mg via INTRAVENOUS

## 2023-08-30 MED ORDER — ACETAMINOPHEN 325 MG PO TABS: 650.0000 mg | ORAL_TABLET | Freq: Four times a day (QID) | ORAL | Status: DC | PRN

## 2023-08-30 MED ORDER — FENTANYL 2500MCG IN NS 250ML (10MCG/ML) PREMIX INFUSION: 0.0000 ug/h | INTRAVENOUS | Status: DC

## 2023-08-30 MED ORDER — VANCOMYCIN HCL IN DEXTROSE 1-5 GM/200ML-% IV SOLN
1000.0000 mg | Freq: Once | INTRAVENOUS | Status: AC
  Administered 2023-08-30: 1000 mg via INTRAVENOUS
  Filled 2023-08-30: qty 200

## 2023-08-30 MED ORDER — MIDAZOLAM HCL 2 MG/2ML IJ SOLN: 2.0000 mg | INTRAMUSCULAR | Status: DC | PRN

## 2023-08-30 MED ORDER — VASOPRESSIN 20 UNITS/100 ML INFUSION FOR SHOCK
0.0000 [IU]/min | INTRAVENOUS | Status: DC
  Administered 2023-08-30 (×2): 0.03 [IU]/min via INTRAVENOUS
  Filled 2023-08-30: qty 100

## 2023-08-30 MED ORDER — POLYETHYLENE GLYCOL 3350 17 G PO PACK: 17.0000 g | PACK | Freq: Every day | ORAL | Status: DC | PRN

## 2023-08-30 MED ORDER — METRONIDAZOLE 500 MG/100ML IV SOLN
500.0000 mg | Freq: Two times a day (BID) | INTRAVENOUS | Status: DC
  Administered 2023-08-30: 500 mg via INTRAVENOUS
  Filled 2023-08-30: qty 100

## 2023-08-30 MED ORDER — SODIUM CHLORIDE 0.9 % IV SOLN: INTRAVENOUS | Status: DC | PRN

## 2023-08-30 MED ORDER — POLYVINYL ALCOHOL 1.4 % OP SOLN: 1.0000 [drp] | Freq: Four times a day (QID) | OPHTHALMIC | Status: DC | PRN

## 2023-08-31 LAB — LEGIONELLA PNEUMOPHILA SEROGP 1 UR AG: L. pneumophila Serogp 1 Ur Ag: NEGATIVE

## 2023-09-01 LAB — CULTURE, BLOOD (ROUTINE X 2)

## 2023-09-29 NOTE — ED Provider Notes (Signed)
  Physical Exam  There were no vitals taken for this visit.  Physical Exam  Procedures  Procedure Name: Intubation Date/Time:  2:00 AM  Performed by: Darrick Grinder, PA-CPre-anesthesia Checklist: Emergency Drugs available, Suction available, Timeout performed, Patient identified and Patient being monitored Oxygen Delivery Method: Ambu bag Preoxygenation: Pre-oxygenation with 100% oxygen Induction Type: Rapid sequence Ventilation: Mask ventilation without difficulty Laryngoscope Size: Glidescope Tube size: 7.5 mm Number of attempts: 1 Airway Equipment and Method: Video-laryngoscopy Placement Confirmation: CO2 detector, Positive ETCO2 and Breath sounds checked- equal and bilateral Secured at: 22 cm Tube secured with: ETT holder      ED Course / MDM    Medical Decision Making         Susan Pitts  [address]    Gilda Crease, MD  [phone]

## 2023-09-29 NOTE — Progress Notes (Signed)
    1038  Spiritual Encounters  Type of Visit Initial  Care provided to: Pt and family  Conversation partners present during encounter Nurse  Referral source Nurse (RN/NT/LPN)  Reason for visit End-of-life  OnCall Visit No   Responded to call for chaplain as family gathered for patient to be moved to comfort care. Daughter, son, son-in-law and two grandsons at bedside. Provided spiritual care through compassionate presence, reflective storytelling, comfort and prayer.  Advised the family of the availability of chaplain. Family will advice nurse if further support fro, chaplain requested.

## 2023-09-29 NOTE — Progress Notes (Signed)
 NAME:  Susan Pitts, MRN:  [id number], DOB:  1923/11/10, LOS: 0 ADMISSION DATE:  , CONSULTATION DATE:   REFERRING MD:  Ronna Polio, MD CHIEF COMPLAINT: Stroke  History of Present Illness:  Susan Pitts is a 89 year old female presented to [facility] health emergency room as a transfer from [facility] facility.  At 7 PM shift change this evening she was last noted to be normal later in the evening she was found unresponsive with difficulty breathing.  Positive right side movement with left-sided deficit noted by nurse. EMS service called, upon arrival patient was hypotensive (EMS unable to get blood pressure) requiring epinephrine drip.  Due to decreased mentation code stroke was called.  In the field EMS assisted ventilation via bag-valve-mask.  Per EMS report there was no movement on her left side since pick up.  Per medical chart the son arrived at the bedside and reported that the family felt that she was not doing well the day prior but unclear on specific symptoms.  Vital signs upon arrival to ED 165 pulse rate, BP 119/92, respirations 18, SpO2 91% with bag mask ventilation.  The patient was intubated in the ED and placed on fentanyl and propofol for sedation.  Secondary to hypotension Levophed as well as vasopressin was initiated.  Subsequently patient had a new onset of atrial fibrillation with rapid ventricular response without further hypotension, amiodarone bolus and drip initiated.  Patient is outside of the window for IV thrombolysis.  CBC reveals cytosis 13.2, BMP sodium 132, chloride 91, BUN 77, creatinine 1.88.  INR elevated 1.7.  CT head without contrast was unremarkable  CT angiography head and neck with contrast revealing a proximal right M1 occlusion and CT perfusion study showed a large core with not much of penumbra. CT angio chest PE protocol: No evidence of pulmonary embolus  The patient has been assessed by ED physician and neurology stroke. PCCM called  for evaluation and admission.  Pertinent  Medical History  Pulmonary hypertension, anxiety disorder, peripheral vascular disease, chronic combined systolic diastolic heart failure, hyperlipidemia arthritis, Constipation, Hypertension, Microscopic hematuria, Nocturia, and Postmenopausal atrophic vaginitis.   Significant Hospital Events: Including procedures, antibiotic start and stop dates in addition to other pertinent events   Endotracheal intubation  Interim History / Subjective:  On vasopressin/norepinephrine with intermittent episodes of hypotension with MAP< 65.   Family have decided to transition to comfort measures.  Objective   Blood pressure (!) 131/91, pulse 100, temperature (!) 96.3 F (35.7 C), resp. rate (!) 22, height 5\' 3"  (1.6 m), weight 59.2 kg, SpO2 100%.    Vent Mode: PRVC FiO2 (%):  [100 %] 100 % Set Rate:  [18 bmp-24 bmp] 24 bmp Vt Set:  [420 mL-430 mL] 420 mL PEEP:  [10 cmH20] 10 cmH20 Plateau Pressure:  [18 cmH20] 18 cmH20  *Amiodarone  Vasopressin Fentanyl 50  Abx: Vancomycin, ceftriaxone, metronidazole   Intake/Output Summary (Last 24 hours) at  0742 Last data filed at  0650 Gross per 24 hour  Intake 1716 ml  Output 5 ml  Net 1711 ml   Filed Weights    0400  0403  Weight: 58 kg 59.2 kg    Examination: Physical exam General: NAD, ill appearing on ventilator support Lungs: coarse, no rhonchi, wheeze, rales. Normal respiratory effort. No accessory muscle use Cardiac: Tachycardia and irregular, no murmer, rub or gallop. +/- Extremity edema Abdomen: Soft, Non tender, decreased Bowel Sounds, no guarding, non distended Skin: warm, good turgor Neuro: GSC eye subscore  is 4.  GCS verbal subscore is 1.  GCS motor score subscore is 4.  spontaneous right-sided movement upper and lower extremity. GU:+ foley catheter   Labs: WBC 13.2 Hemoglobin 15.6 SCR 1.88 [baseline 1.1-1.2] A1c 5.1  UA negative for UTI. Strep  pneumo antigen positive urine.  Imaging:  Chest x-ray right pleural effusion   Resolved Hospital Problem list   NA  Assessment & Plan:  Acute ischemic infarct involving the right cerebral hemisphere -discussed the severity of the stroke with family, currently on comfort care measures.   - chaplain order placed - comfort orders placed.  Shock Hypovolemic vs sepsis              - Comfort orders as above.  Atrial fibrillation Acute respiratory distress    Best Practice (right click and "Reselect all SmartList Selections" daily)   Diet/type: NPO DVT prophylaxis other Pressure ulcer(s): Pressure injury noted vertebral column mid stage II partial-thickness loss of dermis GI prophylaxis: N/A Lines: N/A Foley:  Yes, and it is still needed Code Status:  DNR Last date of multidisciplinary goals of care discussion []   Labs   CBC: Recent Labs  Lab  0150  0151  0348  WBC  --  13.2*  --   NEUTROABS  --  12.2*  --   HGB 16.0* 15.6* 15.6*  HCT 47.0* 47.1* 46.0  MCV  --  98.7  --   PLT  --  360  --     Basic Metabolic Panel: Recent Labs  Lab  0150  0151  0348  NA 134* 136 132*  K 4.3 4.2 3.9  CL 94* 91*  --   CO2  --  25  --   GLUCOSE 85 92  --   BUN 75* 77*  --   CREATININE 2.10* 1.88*  --   CALCIUM  --  9.1  --    GFR: Estimated Creatinine Clearance: 14.1 mL/min (A) (by C-G formula based on SCr of 1.88 mg/dL (H)). Recent Labs  Lab  0151  WBC 13.2*    Liver Function Tests: Recent Labs  Lab  0151  AST 79*  ALT 41  ALKPHOS 248*  BILITOT 1.7*  PROT 5.6*  ALBUMIN 1.9*   No results for input(s): "LIPASE", "AMYLASE" in the last 168 hours. No results for input(s): "AMMONIA" in the last 168 hours.  ABG    Component Value Date/Time   PHART 7.224 (L)  0348   PCO2ART 48.8 (H)  0348   PO2ART 77 (L)  0348   HCO3 20.1  0348   TCO2 22  0348    ACIDBASEDEF 8.0 (H)  0348   O2SAT 92  0348     Coagulation Profile: Recent Labs  Lab  0151  INR 1.7*    Cardiac Enzymes: No results for input(s): "CKTOTAL", "CKMB", "CKMBINDEX", "TROPONINI" in the last 168 hours.  HbA1C: Hgb A1c MFr Bld  Date/Time Value Ref Range Status   04:54 AM 5.5 4.8 - 5.6 % Final    Comment:    (NOTE) Pre diabetes:          5.7%-6.4%  Diabetes:              >6.4%  Glycemic control for   <7.0% adults with diabetes     CBG: Recent Labs  Lab  0720  GLUCAP 134*    Review of Systems:   Patient intubated unable to solicit, per chart is significant to chief complaint  Past Medical History:  She,  has a past medical history of Arthritis, Constipation, Hypertension, Microscopic hematuria, Nocturia, and Postmenopausal atrophic vaginitis.   Surgical History:   Past Surgical History:  Procedure Laterality Date   ABDOMINAL HYSTERECTOMY     JOINT REPLACEMENT     knee replacement   REPLACEMENT TOTAL KNEE     TONSILLECTOMY       Social History:   reports that she has never smoked. She has never been exposed to tobacco smoke. She has never used smokeless tobacco. She reports that she does not drink alcohol and does not use drugs.   Family History:  Her family history includes Heart attack in her father.   Allergies Allergies  Allergen Reactions   Other Anaphylaxis and Swelling    Lima beans   Alendronate Sodium Other (See Comments)   Cardura [Doxazosin Mesylate] Other (See Comments)    UNK reaction   Cardura [Doxazosin]     Other reaction(s): Unknown   Carvedilol Other (See Comments)   Codeine Other (See Comments)    UNK reaction   Fosamax [Alendronate]     Other reaction(s): gastritis   Inderal [Propranolol] Other (See Comments)    UNK reaction   Penicillin G Sodium Other (See Comments)   Shrimp (Diagnostic) Nausea And Vomiting   Penicillins Rash   Penicillins Hives and Rash     Home  Medications  Prior to Admission medications   Medication Sig Start Date End Date Taking? Authorizing Provider  acetaminophen (TYLENOL) 325 MG tablet Take 2 tablets (650 mg total) by mouth every 6 (six) hours as needed for mild pain (or Fever >/= 101). 12/05/20   Drema Dallas, MD  aspirin EC 81 MG tablet Take 81 mg by mouth daily.    [provider]  Calcium Carbonate-Vitamin D (CALTRATE 600+D PO) Take 1 tablet by mouth daily.    [provider]  Cholecalciferol (VITAMIN D3) 50 MCG (2000 UT) capsule Take 2,000 Units by mouth daily.    [provider]  ciclopirox (LOPROX) 0.77 % cream Apply 1 Application topically 2 (two) times daily. 06/17/23   [provider]  Cyanocobalamin (VITAMIN B-12) 2500 MCG SUBL Place 2,500 mcg under the tongue daily.    [provider]  denosumab (PROLIA) 60 MG/ML SOSY injection Inject 60 mg into the skin every 6 (six) months. Scheduled for 08/02/2023    [provider]  diazepam (VALIUM) 5 MG tablet Take 1 tablet (5 mg total) by mouth 2 (two) times daily as needed for anxiety. 08/01/23   [place], Alvira Philips, DO  furosemide (LASIX) 40 MG tablet Take 1 tablet (40 mg total) by mouth daily. 08/01/23 10/30/23  [place], Eric J, DO  HYDROcodone-acetaminophen (NORCO/VICODIN) 5-325 MG tablet Take 1 tablet by mouth 4 (four) times daily as needed for moderate pain (pain score 4-6). 08/01/23   [place], Eric J, DO  loratadine (CLARITIN) 10 MG tablet Take 1 tablet (10 mg total) by mouth daily as needed for allergies. 12/20/20   Medina-Vargas, Monina C, NP  metoprolol succinate (TOPROL-XL) 50 MG 24 hr tablet Take 50 mg by mouth daily. 11/22/21   [provider]  Multiple Vitamin (MULTIVITAMIN) capsule Take 1 capsule by mouth daily.    [provider]  mupirocin ointment (BACTROBAN) 2 % Apply 1 Application topically 2 (two) times daily. 06/27/23   [provider]  nystatin (MYCOSTATIN/NYSTOP) powder Apply 1 Application  topically 2 (two) times daily. 06/27/23   [provider]  polyethylene glycol (MIRALAX / GLYCOLAX) 17  g packet Take 17 g by mouth daily as needed for mild constipation. 08/01/23   [place], Eric J, DO     Critical care time:    Laretta Bolster, M.D.  Internal Medicine Resident, PGY-1 [facility]  Pager: [phone] 7:43 AM, 

## 2023-09-29 NOTE — ED Triage Notes (Signed)
 Pt bibgcems from [facility] 1900. Nurse went o give treatment for sob, and pt was unresponsive had right side movement left side deficit. Inadequate ventilations. BVM with ems, hypotensive ems started epi drip.  Cbg 107 Pulse 110-140 Ems unable to get bp

## 2023-09-29 NOTE — Progress Notes (Signed)
 RT compassionately extubated patient to comfort care per MD order and family wishes. RN and family at bedside. RT verified orders and with MD resident before hand.

## 2023-09-29 NOTE — Death Summary Note (Signed)
  Name: Susan Pitts MRN: [id number] DOB: [birth year] 89 y.o.  Date of Admission:   1:45 AM Date of Discharge:  Attending Physician: Martina Sinner, MD  Discharge Diagnosis: Principal Problem:   Stroke [facility]) Active Problems:   Atrial fibrillation with RVR (HCC)   Cause of death: Stroke, Acute respiratory failure.  Time of death: Sep 15, 1209  Disposition and follow-up:   [name] was discharged from [facility] in expired condition.    Hospital Course: Ms. Susan Pitts is a 89 y.o. female with history of hypertension, chronic diastolic heart failure, pulmonary hypertension, lower extremity lymphedema brought in from her facility where she was recuperating after the last discharge from 08/01/2023--she was last seen in her usual state of health 1900 hrs. on 08/29/2023 and later night noted to be having difficulty breathing for which EMS was called.  Code Stroke CT head - hyperdense right MCA but left MCA is equally hyperdense. CTA head & neck- Proximal right M1 occlusion. She was transferred to ICU, sedated and intubated for airway protection and pressor support.  Noted to be hypotensive even on high doses of 3 pressors, family decided to transition to comfort care.  Signed: Laretta Bolster, MD , 1:47 PM

## 2023-09-29 NOTE — Consult Note (Signed)
 NEUROLOGY CONSULT NOTE   Date of service: August 30, 2023 Patient Name: Susan Pitts MRN:  [id number] DOB:  [birth year] Chief Complaint: "Left-sided weakness, altered mental status" Requesting Provider: Gilda Crease, *  History of Present Illness  Susan Pitts is a 89 y.o. female with hx of hypertension, chronic diastolic heart failure, pulmonary hypertension, lower extremity lymphedema brought in from her facility where she was recuperating after the last discharge from 08/01/2023--she was last seen in her usual state of health 1900 hrs. on 08/29/2023 and later night noted to be having difficulty breathing for which EMS was called.  EMS also noted left-sided weakness and activated a code stroke given signs of altered mentation in addition to weakness.  Her breathing became more tenuous on the way requiring bag mask ventilation and she required intubation due to inability to protect her airway upon arrival to the ED before taking her for CT scan for stroke evaluation. She was unable to provide any history.  Son arrived later in the ED and said that the family felt that she was not doing well even the day prior and thought that she might have had a stroke based on a conversation that she had with her daughter-unclear of what the symptoms were.  According to the ED, she was hypotensive on scene, started on pressors and then she started exhibiting atrial fibrillation with RVR.  On arrival to the ED, she was being ventilated by bag mask ventilation which required emergent intubation.  Still remained in A-fib with RVR  LKW: Facility reported last known well 7 PM on 08/29/2023 Modified rankin score: 4-Needs assistance to walk and tend to bodily needs IV Thrombolysis: Outside the window EVT: Large core, advanced age, poor modified Rankin at baseline  NIHSS components Score: Comment  1a Level of Conscious 0[]  1[]  2[x]  3[]      1b LOC Questions 0[]  1[]  2[x]       1c LOC Commands 0[]  1[]  2[x]       2  Best Gaze 0[x]  1[]  2[]       3 Visual 0[x]  1[]  2[]  3[]      4 Facial Palsy 0[x]  1[]  2[]  3[]      5a Motor Arm - left 0[]  1[]  2[]  3[]  4[x]  UN[]    5b Motor Arm - Right 0[]  1[]  2[]  3[x]  4[]  UN[]    6a Motor Leg - Left 0[]  1[]  2[]  3[]  4[x]  UN[]    6b Motor Leg - Right 0[]  1[]  2[]  3[]  4[x]  UN[]    7 Limb Ataxia 0[x]  1[]  2[]  3[]  UN[]     8 Sensory 0[x]  1[]  2[]  UN[]      9 Best Language 0[]  1[]  2[]  3[x]      10 Dysarthria 0[]  1[]  2[x]  UN[]      11 Extinct. and Inattention 0[x]  1[]  2[]       TOTAL: 26      ROS  Unable to ascertain due to her mentation  Past History   Past Medical History:  Diagnosis Date   Arthritis    Constipation    Hypertension    Microscopic hematuria    Nocturia    Postmenopausal atrophic vaginitis     Past Surgical History:  Procedure Laterality Date   ABDOMINAL HYSTERECTOMY     JOINT REPLACEMENT     knee replacement   REPLACEMENT TOTAL KNEE     TONSILLECTOMY      Family History: Family History  Problem Relation Age of Onset   Heart attack Father     Social History  reports that she  has never smoked. She has never been exposed to tobacco smoke. She has never used smokeless tobacco. She reports that she does not drink alcohol and does not use drugs.  Allergies  Allergen Reactions   Other Anaphylaxis and Swelling    Lima beans   Alendronate Sodium Other (See Comments)   Cardura [Doxazosin Mesylate] Other (See Comments)    UNK reaction   Cardura [Doxazosin]     Other reaction(s): Unknown   Carvedilol Other (See Comments)   Codeine Other (See Comments)    UNK reaction   Fosamax [Alendronate]     Other reaction(s): gastritis   Inderal [Propranolol] Other (See Comments)    UNK reaction   Penicillin G Sodium Other (See Comments)   Shrimp (Diagnostic) Nausea And Vomiting   Penicillins Rash   Penicillins Hives and Rash    Medications   Current Facility-Administered Medications:    0.9 %  sodium chloride infusion, 250 mL, Intravenous, Continuous,  Pollina, Canary Brim, MD   docusate (COLACE) 50 MG/5ML liquid 100 mg, 100 mg, Per Tube, BID, Pollina, Canary Brim, MD   etomidate (AMIDATE) injection, , Intravenous, Code/Trauma/Sedation Med, Pollina, Canary Brim, MD, 20 mg at  0149   fentaNYL (SUBLIMAZE) bolus via infusion 25-100 mcg, 25-100 mcg, Intravenous, Q15 min PRN, Gilda Crease, MD, 25 mcg at  0302   fentaNYL in NS (26mcg/ml) infusion-PREMIX, 25-200 mcg/hr, Intravenous, Continuous, Pollina, Canary Brim, MD, Last Rate: 2.5 mL/hr at  0302, 25 mcg/hr at  0302   midazolam (VERSED) injection 1-2 mg, 1-2 mg, Intravenous, Q1H PRN, Pollina, Canary Brim, MD   norepinephrine (LEVOPHED) 4mg  in (0.016 mg/mL) premix infusion, 0-40 mcg/min, Intravenous, Titrated, Pollina, Canary Brim, MD, Last Rate: 131.3 mL/hr at  0313, 35 mcg/min at  0313   polyethylene glycol (MIRALAX / GLYCOLAX) packet 17 g, 17 g, Per Tube, Daily, Pollina, Canary Brim, MD   succinylcholine (ANECTINE) injection, , Intravenous, Code/Trauma/Sedation Med, Pollina, Canary Brim, MD, 100 mg at  0149   vasopressin (PITRESSIN) 20 Units in 100 mL (0.2 unit/mL) infusion-*FOR SHOCK*, 0-0.03 Units/min, Intravenous, Continuous, Pollina, Canary Brim, MD, Last Rate: 9 mL/hr at  0248, 0.03 Units/min at  0248  Current Outpatient Medications:    acetaminophen (TYLENOL) 325 MG tablet, Take 2 tablets (650 mg total) by mouth every 6 (six) hours as needed for mild pain (or Fever >/= 101)., Disp: 30 tablet, Rfl: 0   aspirin EC 81 MG tablet, Take 81 mg by mouth daily., Disp: , Rfl:    Calcium Carbonate-Vitamin D (CALTRATE 600+D PO), Take 1 tablet by mouth daily., Disp: , Rfl:    Cholecalciferol (VITAMIN D3) 50 MCG (2000 UT) capsule, Take 2,000 Units by mouth daily., Disp: , Rfl:    ciclopirox (LOPROX) 0.77 % cream, Apply 1 Application topically 2 (two) times daily., Disp: , Rfl:     Cyanocobalamin (VITAMIN B-12) 2500 MCG SUBL, Place 2,500 mcg under the tongue daily., Disp: , Rfl:    denosumab (PROLIA) 60 MG/ML SOSY injection, Inject 60 mg into the skin every 6 (six) months. Scheduled for 08/02/2023, Disp: , Rfl:    diazepam (VALIUM) 5 MG tablet, Take 1 tablet (5 mg total) by mouth 2 (two) times daily as needed for anxiety., Disp: 60 tablet, Rfl: 0   furosemide (LASIX) 40 MG tablet, Take 1 tablet (40 mg total) by mouth daily., Disp: 90 tablet, Rfl: 0   HYDROcodone-acetaminophen (NORCO/VICODIN) 5-325 MG tablet, Take 1 tablet by mouth 4 (four) times daily as needed for  moderate pain (pain score 4-6)., Disp: 30 tablet, Rfl: 0   loratadine (CLARITIN) 10 MG tablet, Take 1 tablet (10 mg total) by mouth daily as needed for allergies., Disp: 30 tablet, Rfl: 0   metoprolol succinate (TOPROL-XL) 50 MG 24 hr tablet, Take 50 mg by mouth daily., Disp: , Rfl:    Multiple Vitamin (MULTIVITAMIN) capsule, Take 1 capsule by mouth daily., Disp: , Rfl:    mupirocin ointment (BACTROBAN) 2 %, Apply 1 Application topically 2 (two) times daily., Disp: , Rfl:    nystatin (MYCOSTATIN/NYSTOP) powder, Apply 1 Application topically 2 (two) times daily., Disp: , Rfl:    polyethylene glycol (MIRALAX / GLYCOLAX) 17 g packet, Take 17 g by mouth daily as needed for mild constipation., Disp: , Rfl:   Vitals   Vitals:   2023-09-16 0307 September 16, 2023 0310 09-16-23 0311 09/16/2023 0312  BP: (!) 74/31 (!) 79/62 (!) 103/45 115/78  Pulse: (!) 33 (!) 40 (!) 52 (!) 35  Resp: (!) 27 (!) 24 (!) 25 (!) 22  SpO2: 97% 100% 100% 97%  Height:        Body mass index is 23.12 kg/m.  Physical Exam  General: Obtunded, assisted ventilations HEENT: Normocephalic atraumatic Lungs: Rales all over with diminished breath sounds on the right CVS: Irregularly irregular, tachycardic Abdomen nondistended nontender Extremities with pitting edema bilaterally Neurological exam Obtunded Does not follow commands Nonverbal Cranial nerves:  Pupils equal round react light, does not blink to threat from either side, gaze midline, face appears grossly symmetric within constraints of being bag mask ventilated at this time and later intubated. Motor examination with some movement of the right upper extremity but all the other extremities are nearly flaccid and no withdrawal noted to noxious stimulation in any of the 4 extremities. Sensory exam: As above Coordination cannot be assessed given her mentation  Labs/Imaging/Neurodiagnostic studies   CBC:  Recent Labs  Lab Sep 16, 2023 0150 2023-09-16 0151  WBC  --  13.2*  NEUTROABS  --  12.2*  HGB 16.0* 15.6*  HCT 47.0* 47.1*  MCV  --  98.7  PLT  --  360   Basic Metabolic Panel:  Lab Results  Component Value Date   NA 136 Sep 16, 2023   K 4.2 09-16-2023   CO2 25 09/16/2023   GLUCOSE 92 2023-09-16   BUN 77 (H) 16-Sep-2023   CREATININE 1.88 (H) 09-16-23   CALCIUM 9.1 09-16-2023   GFRNONAA 24 (L) 2023-09-16   Alcohol Level     Component Value Date/Time   ETH <10 2023-09-16 0151   INR  Lab Results  Component Value Date   INR 1.7 (H) 09/16/23   APTT  Lab Results  Component Value Date   APTT 33 16-Sep-2023   CT Head without contrast(Personally reviewed): No acute abnormality.  Reported hyperdense right MCA but left MCA is equally hyperdense.  CT angio Head and Neck with contrast(Personally reviewed): Proximal right M1 occlusion.  CT perfusion 106 cc core, only 2 mL of penumbra.  The rapid map does not seem to be accurate but review of the Tmax, CBF and CBV appears more consistent with large core.  Multifocal airspace opacification worse on the right than on the left.  CT angio chest PE protocol: No PE.  Extensive tree-in-bud and centrilobular nodularity within the upper zones and bibasilar consolidation particularly within the right lower lobe in keeping with changes of multifocal infection.  Areas of hypoenhancement within the consolidated right lung may reflect areas of  early parenchymal necrosis.  Moderate  right parapneumonic effusion.  Trace left parapneumonic effusion.  Moderate multivessel coronary artery calcification.  Marked dilatation of the main pancreatic duct at the level of the pancreatic head with atrophy of the pancreatic parenchyma-not fully assessed with this examination.  Cholelithiasis.  ASSESSMENT   Elloise Roark Witters is a 89 y.o. female with above past medical history brought in after EMS was called for evaluation of respiratory distress and also noted left-sided weakness.  She had some left-sided weakness for them but by the time she arrived to the ED, she was fairly obtunded, and had a very poor exam with some focality of left-sided weakness.  CT head unremarkable with CT angiography shows a proximal right M1 occlusion and CT perfusion study showed a large core with not much of penumbra. New onset atrial fibrillation noted.  Impression: Acute ischemic infarct involving the right cerebral hemisphere likely secondary to new onset atrial fibrillation.  RECOMMENDATIONS  She has been emergently intubated.  Will need admission to ICU.  Appreciate PCCM taking care of the patient. Management of atrial fibrillation per primary team. From a stroke standpoint, will need MRI of the brain without contrast, frequent rechecks and telemetry. 2D echocardiogram, A1c and lipid panel. Blood pressure parameters-allow for permissive hypertension and treat only if blood pressures are greater than 220.  She came in pretty hypotensive.  I would recommend avoidance of hypotension and keeping the lower limit for systolic blood pressure above 161 at all times. I had a detailed discussion with the son at bedside and explained to him the CT perfusion findings and how they are not favorable given the large core, her advanced age and current debility to pursue any sort of intervention.  He is in agreement. Physical therapy Occupational Therapy and speech therapy assessments when  appropriate. From a stroke prevention standpoint, aspirin 81 for now.  In the long-term she will need anticoagulation but as of right now if absolutely needed from the medical manage standpoint of atrial fibrillation, can start heparin drip without bolus-stroke protocol but I would suspect that she would have a very large stroke and heparin would increase the risk of hemorrhagic transformation. High intensity statin for goal LDL less than 70.  Stroke team to follow. ______________________________________________________________________  Plan relayed to the CCM admitting PA via secure chat.  Plan also discussed with Dr. Oletta Cohn in the ER  Signed, Milon Dikes, MD [facility]   CRITICAL CARE ATTESTATION Performed by: Milon Dikes, MD Total critical care time: 60 minutes Critical care time was exclusive of separately billable procedures and treating other patients and/or supervising APPs/Residents/Students Critical care was necessary to treat or prevent imminent or life-threatening deterioration. This patient is critically ill and at significant risk for neurological worsening and/or death and care requires constant monitoring. Critical care was time spent personally by me on the following activities: development of treatment plan with patient and/or surrogate as well as nursing, discussions with consultants, evaluation of patient's response to treatment, examination of patient, obtaining history from patient or surrogate, ordering and performing treatments and interventions, ordering and review of laboratory studies, ordering and review of radiographic studies, pulse oximetry, re-evaluation of patient's condition, participation in multidisciplinary rounds and medical decision making of high complexity in the care of this patient. '

## 2023-09-29 NOTE — Code Documentation (Signed)
 Stroke Response Nurse Documentation Code Documentation  Susan Pitts is a 89 y.o. female arriving to [facility]  via [facility] on 4/1 with past medical hx of pulmonary hypertension, CHF, CKD, HTN. On ASA 81 mg. Code stroke was activated by EMS.   Patient from SNF where she was LKW at 1900 and now complaining of unresponsiveness and left sided weakness.   Stroke team at the bedside on patient arrival. Labs drawn and patient cleared for CT by Dr. Blinda Leatherwood after intubation. Patient to CT with team. NIHSS 30, see documentation for details and code stroke times. Patient with decreased LOC, disoriented, not following commands, left hemianopia, left facial droop, left arm weakness, bilateral leg weakness, left decreased sensation, Global aphasia , and dysarthria  on exam. The following imaging was completed:  CT Head, CTA, and CTP. Patient is not a candidate for IV Thrombolytic due to outside of window. Patient is not a candidate for IR due to large core and poor baseline.   Care Plan: Neuro checks q2 hrs.   Process Delays Noted: Stabilization/Intubation  Bedside handoff with ED RN Whitney.    Rose Fillers  Rapid Response RN

## 2023-09-29 NOTE — IPAL (Signed)
  Interdisciplinary Goals of Care Family Meeting   Date carried out:   Location of the meeting: Bedside  Member's involved: Physician, Bedside Registered Nurse, and Family Member or next of kin  Durable Power of Attorney or acting medical decision maker: Family  Discussion: We discussed goals of care for [employer] .  We discussed the severe stroke that she has suffered to the right side of her brain and that if she survived this hospitalization she would be dependent on full time care. They stated she would not want to live like that. We discussed her increasing vasopressor requirements to support her heart and blood pressure. I recommended they notify family about coming in to visit if they would like and once everyone is at peace we will transition to comfort care but if she declines in the meantime will transition to comfort care.  Code status:   Code Status: Do not attempt resuscitation (DNR) PRE-ARREST INTERVENTIONS DESIRED   Disposition: In-patient comfort care  Time spent for the meeting: 15 minutes    Martina Sinner, MD  , 9:04 AM

## 2023-09-29 NOTE — H&P (Signed)
 NAME:  Susan Pitts, MRN:  [id number], DOB:  [birth year], LOS: 0 ADMISSION DATE:  , CONSULTATION DATE:   REFERRING MD:  Ronna Polio, MD CHIEF COMPLAINT: Stroke  History of Present Illness:  Susan Pitts is a 89 year old female presented to [facility] emergency room as a transfer from [facility] facility.  At 7 PM shift change this evening she was last noted to be normal later in the evening she was found unresponsive with difficulty breathing.  Positive right side movement with left-sided deficit noted by nurse. EMS service called, upon arrival patient was hypotensive (EMS unable to get blood pressure) requiring epinephrine drip.  Due to decreased mentation code stroke was called.  In the field EMS assisted ventilation via bag-valve-mask.  Per EMS report there was no movement on her left side since pick up.  Per medical chart the son arrived at the bedside and reported that the family felt that she was not doing well the day prior but unclear on specific symptoms.  Vital signs upon arrival to ED 165 pulse rate, BP 119/92, respirations 18, SpO2 91% with bag mask ventilation.  The patient was intubated in the ED and placed on fentanyl and propofol for sedation.  Secondary to hypotension Levophed as well as vasopressin was initiated.  Subsequently patient had a new onset of atrial fibrillation with rapid ventricular response without further hypotension, amiodarone bolus and drip initiated.  Patient is outside of the window for IV thrombolysis.  CBC reveals cytosis 13.2, BMP sodium 132, chloride 91, BUN 77, creatinine 1.88.  INR elevated 1.7.  CT head without contrast was unremarkable  CT angiography head and neck with contrast revealing a proximal right M1 occlusion and CT perfusion study showed a large core with not much of penumbra. CT angio chest PE protocol: No evidence of pulmonary embolus  The patient has been assessed by ED physician and neurology stroke. PCCM called  for evaluation and admission.  Pertinent  Medical History  Pulmonary hypertension, anxiety disorder, peripheral vascular disease, chronic combined systolic diastolic heart failure, hyperlipidemia arthritis, Constipation, Hypertension, Microscopic hematuria, Nocturia, and Postmenopausal atrophic vaginitis.   Significant Hospital Events: Including procedures, antibiotic start and stop dates in addition to other pertinent events   Endotracheal intubation  Interim History / Subjective:  89 yo female with Acute ischemic infarct involving the right cerebral hemisphere.  Objective   Blood pressure (!) 153/118, pulse (!) 129, resp. rate (!) 23, height 5\' 3"  (1.6 m), SpO2 98%.    Vent Mode: PRVC FiO2 (%):  [100 %] 100 % Set Rate:  [18 bmp] 18 bmp Vt Set:  [420 mL] 420 mL PEEP:  [5 cmH20] 5 cmH20 Plateau Pressure:  [18 cmH20] 18 cmH20  No intake or output data in the 24 hours ending  0305 There were no vitals filed for this visit.  Examination: Physical exam General: NAD, ill appearing on ventilator support Lungs: coarse, no rhonchi, wheeze, rales. Normal respiratory effort. No accessory muscle use Cardiac: Tachycardia and irregular, no murmer, rub or gallop. +/- Extremity edema Abdomen: Soft, Non tender, decreased Bowel Sounds, no guarding, non distended Skin: warm, good turgor Neuro: GSC eye subscore is 4.  GCS verbal subscore is 1.  GCS motor score subscore is 4.  spontaneous right-sided movement upper and lower extremity. GU:+ foley catheter   Resolved Hospital Problem list   NA  Assessment & Plan:  Acute ischemic infarct involving the right cerebral hemisphere  Neurochecks per protocol  Brain MRI without contrast  Telemetry monitoring  Permissive hypertension SBP 120-220  Neurology/stroke team to follow Hypotension  Monitor BP maintain SBP 120 - 220  Titrate current vasoactive agents (Levophed/Vaso) Discontinue epinephrine Consideration for Central venous access and  Arterial line placement Atrial fibrillation Amiodarone infusion  2D Echo Discuss anticoagulation with neuro, current ASA 81mg  Heparin qtt consideration however potential of hemorrhagic transformation  Acute respiratory distress  Ventilator: PRVC: Tv 450. 10/18  Respiratory culture  Blood culture sent  Pharmacy consult for IV antibiotic Zosyn and vancomycin  Best Practice (right click and "Reselect all SmartList Selections" daily)   Diet/type: NPO DVT prophylaxis other Pressure ulcer(s): Pressure injury noted vertebral column mid stage II partial-thickness loss of dermis GI prophylaxis: N/A Lines: N/A Foley:  Yes, and it is still needed Code Status:  DNR Last date of multidisciplinary goals of care discussion []   Labs   CBC: Recent Labs  Lab  0150  0151  WBC  --  13.2*  NEUTROABS  --  12.2*  HGB 16.0* 15.6*  HCT 47.0* 47.1*  MCV  --  98.7  PLT  --  360    Basic Metabolic Panel: Recent Labs  Lab  0150  0151  NA 134* 136  K 4.3 4.2  CL 94* 91*  CO2  --  25  GLUCOSE 85 92  BUN 75* 77*  CREATININE 2.10* 1.88*  CALCIUM  --  9.1   GFR: CrCl cannot be calculated (Unknown ideal weight.). Recent Labs  Lab  0151  WBC 13.2*    Liver Function Tests: Recent Labs  Lab  0151  AST 79*  ALT 41  ALKPHOS 248*  BILITOT 1.7*  PROT 5.6*  ALBUMIN 1.9*   No results for input(s): "LIPASE", "AMYLASE" in the last 168 hours. No results for input(s): "AMMONIA" in the last 168 hours.  ABG    Component Value Date/Time   TCO2 26  0150     Coagulation Profile: Recent Labs  Lab  0151  INR 1.7*    Cardiac Enzymes: No results for input(s): "CKTOTAL", "CKMB", "CKMBINDEX", "TROPONINI" in the last 168 hours.  HbA1C: No results found for: "HGBA1C"  CBG: No results for input(s): "GLUCAP" in the last 168 hours.  Review of Systems:   Patient intubated unable to solicit, per chart is significant to  chief complaint  Past Medical History:  She,  has a past medical history of Arthritis, Constipation, Hypertension, Microscopic hematuria, Nocturia, and Postmenopausal atrophic vaginitis.   Surgical History:   Past Surgical History:  Procedure Laterality Date   ABDOMINAL HYSTERECTOMY     JOINT REPLACEMENT     knee replacement   REPLACEMENT TOTAL KNEE     TONSILLECTOMY       Social History:   reports that she has never smoked. She has never been exposed to tobacco smoke. She has never used smokeless tobacco. She reports that she does not drink alcohol and does not use drugs.   Family History:  Her family history includes Heart attack in her father.   Allergies Allergies  Allergen Reactions   Other Anaphylaxis and Swelling    Lima beans   Alendronate Sodium Other (See Comments)   Cardura [Doxazosin Mesylate] Other (See Comments)    UNK reaction   Cardura [Doxazosin]     Other reaction(s): Unknown   Carvedilol Other (See Comments)   Codeine Other (See Comments)    UNK reaction   Fosamax [Alendronate]     Other reaction(s): gastritis   Inderal [Propranolol] Other (See Comments)  UNK reaction   Penicillin G Sodium Other (See Comments)   Shrimp (Diagnostic) Nausea And Vomiting   Penicillins Rash   Penicillins Hives and Rash     Home Medications  Prior to Admission medications   Medication Sig Start Date End Date Taking? Authorizing Provider  acetaminophen (TYLENOL) 325 MG tablet Take 2 tablets (650 mg total) by mouth every 6 (six) hours as needed for mild pain (or Fever >/= 101). 12/05/20   Drema Dallas, MD  aspirin EC 81 MG tablet Take 81 mg by mouth daily.    [provider]  Calcium Carbonate-Vitamin D (CALTRATE 600+D PO) Take 1 tablet by mouth daily.    [provider]  Cholecalciferol (VITAMIN D3) 50 MCG (2000 UT) capsule Take 2,000 Units by mouth daily.    [provider]  ciclopirox (LOPROX) 0.77 % cream Apply 1 Application topically 2  (two) times daily. 06/17/23   [provider]  Cyanocobalamin (VITAMIN B-12) 2500 MCG SUBL Place 2,500 mcg under the tongue daily.    [provider]  denosumab (PROLIA) 60 MG/ML SOSY injection Inject 60 mg into the skin every 6 (six) months. Scheduled for 08/02/2023    [provider]  diazepam (VALIUM) 5 MG tablet Take 1 tablet (5 mg total) by mouth 2 (two) times daily as needed for anxiety. 08/01/23   [place], Alvira Philips, DO  furosemide (LASIX) 40 MG tablet Take 1 tablet (40 mg total) by mouth daily. 08/01/23 10/30/23  [place], Eric J, DO  HYDROcodone-acetaminophen (NORCO/VICODIN) 5-325 MG tablet Take 1 tablet by mouth 4 (four) times daily as needed for moderate pain (pain score 4-6). 08/01/23   [place], Eric J, DO  loratadine (CLARITIN) 10 MG tablet Take 1 tablet (10 mg total) by mouth daily as needed for allergies. 12/20/20   Medina-Vargas, Monina C, NP  metoprolol succinate (TOPROL-XL) 50 MG 24 hr tablet Take 50 mg by mouth daily. 11/22/21   [provider]  Multiple Vitamin (MULTIVITAMIN) capsule Take 1 capsule by mouth daily.    [provider]  mupirocin ointment (BACTROBAN) 2 % Apply 1 Application topically 2 (two) times daily. 06/27/23   [provider]  nystatin (MYCOSTATIN/NYSTOP) powder Apply 1 Application topically 2 (two) times daily. 06/27/23   [provider]  polyethylene glycol (MIRALAX / GLYCOLAX) 17 g packet Take 17 g by mouth daily as needed for mild constipation. 08/01/23   [place], Eric J, DO     Critical care time: 35 minutes spent with patient    Vernia Buff T. Kizzie Ide, PA- C PCCM

## 2023-09-29 NOTE — Progress Notes (Signed)
 Transported patient to C.T while patient is on the ventilator.

## 2023-09-29 NOTE — Plan of Care (Signed)
     Referral previously received for Susan Pitts for goals of care discussion. I spent time reviewing that chart. Patient was admitted for CVA, shock (hypovolemic versus septic), respiratory failure, and AFib. PCCM IPAL meeting today where family made her DNR and agreed to transition to comfort care when rest of family is able to arrive.  When I came to the unit to see the partient and family I was informed by the PCCM resident that the patient's family was able to arrive and visit at which point they transitioned to comfort and she was compassionately extubated. She passed shortly after.  Thank you for your referral and allowing PMT to assist in Susan Pitts's care.   Wynne Dust, NP Palliative Medicine Team Phone: [phone]  NO CHARGE

## 2023-09-29 DEATH — deceased
